# Patient Record
Sex: Female | Born: 1950 | Race: White | Hispanic: No | Marital: Single | Smoking: Never smoker
Health system: Southern US, Community
[De-identification: ages and names within clinical notes are randomized; demographics above are authoritative.]

## PROBLEM LIST (undated history)

## (undated) DIAGNOSIS — F32A Depression, unspecified: Secondary | ICD-10-CM

## (undated) DIAGNOSIS — E669 Obesity, unspecified: Secondary | ICD-10-CM

## (undated) DIAGNOSIS — D649 Anemia, unspecified: Secondary | ICD-10-CM

## (undated) DIAGNOSIS — M1990 Unspecified osteoarthritis, unspecified site: Secondary | ICD-10-CM

## (undated) DIAGNOSIS — I10 Essential (primary) hypertension: Secondary | ICD-10-CM

## (undated) DIAGNOSIS — E559 Vitamin D deficiency, unspecified: Secondary | ICD-10-CM

## (undated) DIAGNOSIS — G4730 Sleep apnea, unspecified: Secondary | ICD-10-CM

## (undated) DIAGNOSIS — N946 Dysmenorrhea, unspecified: Secondary | ICD-10-CM

## (undated) DIAGNOSIS — H269 Unspecified cataract: Secondary | ICD-10-CM

## (undated) DIAGNOSIS — E785 Hyperlipidemia, unspecified: Secondary | ICD-10-CM

## (undated) DIAGNOSIS — F329 Major depressive disorder, single episode, unspecified: Secondary | ICD-10-CM

## (undated) DIAGNOSIS — D219 Benign neoplasm of connective and other soft tissue, unspecified: Secondary | ICD-10-CM

## (undated) DIAGNOSIS — C801 Malignant (primary) neoplasm, unspecified: Secondary | ICD-10-CM

## (undated) DIAGNOSIS — R011 Cardiac murmur, unspecified: Secondary | ICD-10-CM

## (undated) DIAGNOSIS — M2550 Pain in unspecified joint: Secondary | ICD-10-CM

## (undated) DIAGNOSIS — R0602 Shortness of breath: Secondary | ICD-10-CM

## (undated) DIAGNOSIS — N6459 Other signs and symptoms in breast: Secondary | ICD-10-CM

## (undated) HISTORY — DX: Malignant (primary) neoplasm, unspecified: C80.1

## (undated) HISTORY — PX: BREAST EXCISIONAL BIOPSY: SUR124

## (undated) HISTORY — PX: BREAST BIOPSY: SHX20

## (undated) HISTORY — DX: Essential (primary) hypertension: I10

## (undated) HISTORY — DX: Sleep apnea, unspecified: G47.30

## (undated) HISTORY — DX: Anemia, unspecified: D64.9

## (undated) HISTORY — DX: Unspecified osteoarthritis, unspecified site: M19.90

## (undated) HISTORY — DX: Dysmenorrhea, unspecified: N94.6

## (undated) HISTORY — DX: Depression, unspecified: F32.A

## (undated) HISTORY — DX: Major depressive disorder, single episode, unspecified: F32.9

## (undated) HISTORY — PX: COLONOSCOPY: SHX174

## (undated) HISTORY — DX: Shortness of breath: R06.02

## (undated) HISTORY — DX: Unspecified cataract: H26.9

## (undated) HISTORY — DX: Cardiac murmur, unspecified: R01.1

## (undated) HISTORY — PX: EYE SURGERY: SHX253

## (undated) HISTORY — DX: Obesity, unspecified: E66.9

## (undated) HISTORY — PX: PELVIC LAPAROSCOPY: SHX162

## (undated) HISTORY — DX: Vitamin D deficiency, unspecified: E55.9

## (undated) HISTORY — DX: Hyperlipidemia, unspecified: E78.5

## (undated) HISTORY — DX: Pain in unspecified joint: M25.50

## (undated) HISTORY — DX: Benign neoplasm of connective and other soft tissue, unspecified: D21.9

---

## 2001-05-04 HISTORY — PX: TOTAL ABDOMINAL HYSTERECTOMY: SHX209

## 2008-02-21 HISTORY — PX: BLEPHAROPLASTY: SUR158

## 2016-06-26 ENCOUNTER — Other Ambulatory Visit: Payer: Self-pay | Admitting: Obstetrics and Gynecology

## 2016-06-26 DIAGNOSIS — Z1231 Encounter for screening mammogram for malignant neoplasm of breast: Secondary | ICD-10-CM

## 2016-07-04 ENCOUNTER — Ambulatory Visit
Admission: RE | Admit: 2016-07-04 | Discharge: 2016-07-04 | Disposition: A | Discharge status: Home / Self Care | Payer: PRIVATE HEALTH INSURANCE | Source: Ambulatory Visit | Attending: Obstetrics and Gynecology | Admitting: Obstetrics and Gynecology

## 2016-07-04 DIAGNOSIS — Z1231 Encounter for screening mammogram for malignant neoplasm of breast: Secondary | ICD-10-CM

## 2016-07-05 ENCOUNTER — Encounter: Payer: Self-pay | Admitting: Obstetrics and Gynecology

## 2016-07-05 ENCOUNTER — Ambulatory Visit (INDEPENDENT_AMBULATORY_CARE_PROVIDER_SITE_OTHER): Payer: 59 | Admitting: Obstetrics and Gynecology

## 2016-07-05 VITALS — BP 132/80 | HR 88 | Resp 14 | Ht 64.25 in | Wt 224.0 lb

## 2016-07-05 DIAGNOSIS — Z01419 Encounter for gynecological examination (general) (routine) without abnormal findings: Secondary | ICD-10-CM

## 2016-07-05 DIAGNOSIS — Z90710 Acquired absence of both cervix and uterus: Secondary | ICD-10-CM

## 2016-07-05 DIAGNOSIS — R3915 Urgency of urination: Secondary | ICD-10-CM | POA: Diagnosis not present

## 2016-07-05 LAB — POCT URINALYSIS DIPSTICK
BILIRUBIN UA: NEGATIVE
Blood, UA: NEGATIVE
GLUCOSE UA: NEGATIVE
KETONES UA: NEGATIVE
Nitrite, UA: POSITIVE
Protein, UA: NEGATIVE
Urobilinogen, UA: NEGATIVE
pH, UA: 5

## 2016-07-05 NOTE — Progress Notes (Signed)
66 y.o. G0P0000 SingleCaucasianF here as a new patient for an annual exam.  Patient has also had occasional urgency of urination and some pain when urinating. Her urinary symptoms are mild and intermittent for the last month. Sometimes she has urgency, with small amounts, not always. Pain is just off and on.  H/O TAH/BSO. Hasn't been sexually active for years. No vaginal bleeding.  She had her hysterectomy at 4, she was on premarin until last year. She is having several hot flashes a day. Not getting any better or worse. She has night sweats about 1 x a night.     Patient's last menstrual period was 06/12/2000.          Sexually active: No.  The current method of family planning is status post hysterectomy.    Exercising: No.  The patient does not participate in regular exercise at present. Smoker:  no  Health Maintenance: Pap:  07/02/15 [facility] - per patient normal History of abnormal Pap:  no MMG:  07/04/16 - no results in EPIC yet Colonoscopy:  09/02/12 - Polyps removed - normal per patient BMD:   06/04/13 - normal per patient  TDaP:  03/20/2008 Gardasil: N/A   reports that she has never smoked. She has never used smokeless tobacco. She reports that she does not drink alcohol or use drugs.Retired, she was a Network engineer.   Past Medical History:  Diagnosis Date  . Depression   . Dysmenorrhea   . Fibroid   . Hyperlipemia   . Hyperlipidemia   . Hypertension     Past Surgical History:  Procedure Laterality Date  . BLEPHAROPLASTY Bilateral 02/21/2008  . BREAST BIOPSY Right   . PELVIC LAPAROSCOPY    . TOTAL ABDOMINAL HYSTERECTOMY  05/04/2001    Current Outpatient Prescriptions  Medication Sig Dispense Refill  . aspirin EC 81 MG tablet Take 81 mg by mouth daily.    . Calcium Carbonate-Vit D-Min (CALCIUM 1200 PO) Take by mouth daily.    . cholecalciferol (VITAMIN D) 1000 units tablet Take 1,000 Units by mouth daily.    Marland Kitchen losartan (COZAAR) 100 MG tablet Take  1 tablet by mouth daily.  1  . simvastatin (ZOCOR) 20 MG tablet Take 1 tablet by mouth daily.  6   No current facility-administered medications for this visit.     Family History  Problem Relation Age of Onset  . Hypertension Mother   . Alzheimer's disease Mother   . Heart disease Mother   . Hyperlipidemia Mother   . Hypertension Father   . Diabetes Father   . Stroke Father   . Breast cancer Sister   . Hypertension Sister   . Breast cancer Sister   . Hypertension Sister   . Hypertension Sister     Review of Systems  Constitutional: Negative.   Eyes: Negative.   Respiratory: Negative.   Cardiovascular: Negative.   Gastrointestinal: Negative.   Endocrine: Negative.   Genitourinary: Negative.   Musculoskeletal: Negative.   Skin: Negative.   Allergic/Immunologic: Negative.   Neurological: Negative.   Hematological: Negative.   Psychiatric/Behavioral: Negative.     Exam:   BP 132/80 (BP Location: Right Arm, Patient Position: Sitting, Cuff Size: Large)   Pulse 88   Resp 14   Ht 5' 4.25" (1.632 m)   Wt 224 lb (101.6 kg)   LMP 06/12/2000   BMI 38.15 kg/m   Weight change: @WEIGHTCHANGE @ Height:   Height: 5' 4.25" (163.2 cm)  Ht Readings from Last  3 Encounters:  07/05/16 5' 4.25" (1.632 m)    General appearance: alert, cooperative and appears stated age Head: Normocephalic, without obvious abnormality, atraumatic Neck: no adenopathy, supple, symmetrical, trachea midline and thyroid normal to inspection and palpation Lungs: clear to auscultation bilaterally Cardiovascular: regular rate and rhythm Breasts: normal appearance, no masses or tenderness, bilaterally inverted nipples (always) Heart: regular rate and rhythm Abdomen: soft, non-tender; bowel sounds normal; no masses,  no organomegaly Extremities: extremities normal, atraumatic, no cyanosis or edema Skin: Skin color, texture, turgor normal. No rashes or lesions Lymph nodes: Cervical, supraclavicular, and  axillary nodes normal. No abnormal inguinal nodes palpated Neurologic: Grossly normal   Pelvic: External genitalia:  no lesions              Urethra:  normal appearing urethra with no masses, tenderness or lesions              Bartholins and Skenes: normal                 Vagina: normal appearing vagina with normal color and discharge, no lesions              Cervix: absent               Bimanual Exam:  Uterus:  uterus absent              Adnexa: no mass, fullness, tenderness               Rectovaginal: Confirms               Anus:  normal sphincter tone, no lesions  Chaperone was present for exam.  Urine dip with Positive nitrates, trace leuk  A:  Well Woman with normal exam  H/O hysterectomy  Vague urinary c/o, urine dip concerning for UTI  Vasomotor symptoms. Off of ERT for the last year, off antidepresssants  P:   No pap needed  Urine for ua, c&s (will await culture to treat since her symptoms are long term and vague)  Mammogram just done  Colonoscopy UTD  Labs and immunizations with primary MD  Discussed behavioral changes for vasomotor

## 2016-07-05 NOTE — Patient Instructions (Signed)

## 2016-07-06 LAB — URINALYSIS, MICROSCOPIC ONLY
Casts: NONE SEEN [LPF]
Crystals: NONE SEEN [HPF]
Yeast: NONE SEEN [HPF]

## 2016-07-07 ENCOUNTER — Telehealth: Payer: Self-pay

## 2016-07-07 LAB — URINE CULTURE

## 2016-07-07 MED ORDER — SULFAMETHOXAZOLE-TRIMETHOPRIM 800-160 MG PO TABS: 1.0000 | ORAL_TABLET | Freq: Two times a day (BID) | ORAL | 0 refills | Status: DC

## 2016-07-07 NOTE — Telephone Encounter (Signed)
-----   Message from Salvadore Dom, MD sent at 07/06/2016  8:41 AM EST ----- Suspicious for UTI, culture pending. Long term vague urinary c/o. Will await culture to treat.  Will you please look for culture results tomorrow, I'll be out of town. You can call me, or run it by one of the other [place]. Thanks!

## 2016-07-07 NOTE — Telephone Encounter (Signed)
Spoke with patient. Advised of message as seen below from [place]. Patient verbalizes understanding. Patient states she is having intermittent urinary urgency and pain with urination. Denies worsening symptoms. Rx for Bactrim DS take 1 tablet po BID x 3 days #6 0RF sent to pharmacy on file.  Notes Recorded by Nunzio Cobbs, MD on 07/07/2016 at 5:40 AM EST Please touch base with the patient to see how her urinary symptoms are today.  Her urine culture is showing infection with E Coli.  I would like to have her get started on Bactrim DS one po bid for 3 days.  Please send to pharmacy of choice. Final sensitivities are pending, so it is possible that we will need to switch her antibiotic when this results.  It is the weekend, and I want her to be able to start treatment.  Drink plenty of water also!  Cc: Dr.Jertson  Routing to provider for final review. Patient agreeable to disposition. Will close encounter.

## 2016-07-07 NOTE — Telephone Encounter (Signed)
Encounter opened in error

## 2016-09-18 DIAGNOSIS — Z6838 Body mass index (BMI) 38.0-38.9, adult: Secondary | ICD-10-CM | POA: Diagnosis not present

## 2016-09-18 DIAGNOSIS — I10 Essential (primary) hypertension: Secondary | ICD-10-CM | POA: Diagnosis not present

## 2016-09-18 DIAGNOSIS — Z1159 Encounter for screening for other viral diseases: Secondary | ICD-10-CM | POA: Diagnosis not present

## 2016-09-18 DIAGNOSIS — R635 Abnormal weight gain: Secondary | ICD-10-CM | POA: Diagnosis not present

## 2016-09-18 DIAGNOSIS — E669 Obesity, unspecified: Secondary | ICD-10-CM | POA: Diagnosis not present

## 2016-09-18 DIAGNOSIS — E785 Hyperlipidemia, unspecified: Secondary | ICD-10-CM | POA: Diagnosis not present

## 2016-09-18 DIAGNOSIS — N183 Chronic kidney disease, stage 3 (moderate): Secondary | ICD-10-CM | POA: Diagnosis not present

## 2016-10-12 DIAGNOSIS — L237 Allergic contact dermatitis due to plants, except food: Secondary | ICD-10-CM | POA: Diagnosis not present

## 2016-10-12 DIAGNOSIS — D485 Neoplasm of uncertain behavior of skin: Secondary | ICD-10-CM | POA: Diagnosis not present

## 2016-10-12 DIAGNOSIS — L570 Actinic keratosis: Secondary | ICD-10-CM | POA: Diagnosis not present

## 2016-10-12 DIAGNOSIS — L821 Other seborrheic keratosis: Secondary | ICD-10-CM | POA: Diagnosis not present

## 2016-10-12 DIAGNOSIS — L814 Other melanin hyperpigmentation: Secondary | ICD-10-CM | POA: Diagnosis not present

## 2016-10-12 DIAGNOSIS — D1801 Hemangioma of skin and subcutaneous tissue: Secondary | ICD-10-CM | POA: Diagnosis not present

## 2016-11-07 DIAGNOSIS — Z23 Encounter for immunization: Secondary | ICD-10-CM | POA: Diagnosis not present

## 2017-02-09 DIAGNOSIS — R8290 Unspecified abnormal findings in urine: Secondary | ICD-10-CM | POA: Diagnosis not present

## 2017-02-09 DIAGNOSIS — R509 Fever, unspecified: Secondary | ICD-10-CM | POA: Diagnosis not present

## 2017-02-10 DIAGNOSIS — R8290 Unspecified abnormal findings in urine: Secondary | ICD-10-CM | POA: Diagnosis not present

## 2017-04-27 DIAGNOSIS — H6501 Acute serous otitis media, right ear: Secondary | ICD-10-CM | POA: Diagnosis not present

## 2017-04-27 DIAGNOSIS — R05 Cough: Secondary | ICD-10-CM | POA: Diagnosis not present

## 2017-06-06 ENCOUNTER — Other Ambulatory Visit: Payer: Self-pay | Admitting: Obstetrics and Gynecology

## 2017-06-06 DIAGNOSIS — Z1231 Encounter for screening mammogram for malignant neoplasm of breast: Secondary | ICD-10-CM

## 2017-06-22 DIAGNOSIS — R69 Illness, unspecified: Secondary | ICD-10-CM | POA: Diagnosis not present

## 2017-07-02 ENCOUNTER — Ambulatory Visit: Payer: 59 | Admitting: Obstetrics and Gynecology

## 2017-07-05 ENCOUNTER — Ambulatory Visit
Admission: RE | Admit: 2017-07-05 | Discharge: 2017-07-05 | Disposition: A | Discharge status: Home / Self Care | Payer: Medicare HMO | Source: Ambulatory Visit | Attending: Obstetrics and Gynecology | Admitting: Obstetrics and Gynecology

## 2017-07-05 DIAGNOSIS — Z1231 Encounter for screening mammogram for malignant neoplasm of breast: Secondary | ICD-10-CM | POA: Diagnosis not present

## 2017-07-09 ENCOUNTER — Ambulatory Visit: Payer: 59 | Admitting: Obstetrics and Gynecology

## 2017-07-16 ENCOUNTER — Telehealth: Payer: Self-pay | Admitting: Obstetrics and Gynecology

## 2017-07-16 NOTE — Telephone Encounter (Signed)
Left message regarding upcoming appointment has been canceled and needs to be rescheduled. °

## 2017-08-03 ENCOUNTER — Ambulatory Visit: Payer: 59 | Admitting: Obstetrics and Gynecology

## 2017-08-03 ENCOUNTER — Encounter: Payer: Self-pay | Admitting: Obstetrics and Gynecology

## 2017-08-03 ENCOUNTER — Other Ambulatory Visit: Payer: Self-pay

## 2017-08-03 ENCOUNTER — Ambulatory Visit (INDEPENDENT_AMBULATORY_CARE_PROVIDER_SITE_OTHER): Payer: Medicare HMO | Admitting: Obstetrics and Gynecology

## 2017-08-03 VITALS — BP 158/80 | HR 88 | Resp 16 | Ht 64.0 in | Wt 232.0 lb

## 2017-08-03 DIAGNOSIS — Z01419 Encounter for gynecological examination (general) (routine) without abnormal findings: Secondary | ICD-10-CM | POA: Diagnosis not present

## 2017-08-03 DIAGNOSIS — R011 Cardiac murmur, unspecified: Secondary | ICD-10-CM | POA: Diagnosis not present

## 2017-08-03 NOTE — Patient Instructions (Signed)

## 2017-08-03 NOTE — Progress Notes (Signed)
67 y.o. G0P0000 SingleCaucasianF here for annual exam.  H/O TAH/BSO. No vaginal bleeding, not currently sexually active.  She had pyelonephritis in 9/18.  No bladder c/o.  Plans to visit her niece in [place].     Patient's last menstrual period was 06/12/2000.          Sexually active: No.  The current method of family planning is status post hysterectomy.    Exercising: Yes.    silver sneakers  Smoker:  no  Health Maintenance: Pap:  07/02/15 [facility] - per patient normalHistory of abnormal Pap:  no MMG:  07-05-17 WNL  Colonoscopy:  3-24-14normal per patient  BMD:   06-04-13 normal per patient  TDaP:  03-20-08 Gardasil: N/A   reports that  has never smoked. she has never used smokeless tobacco. She reports that she drinks about 1.8 - 3.0 oz of alcohol per week. She reports that she does not use drugs. 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  Past Medical History:  Diagnosis Date  . Depression   . Dysmenorrhea   . Fibroid   . Hyperlipemia   . Hyperlipidemia   . Hypertension     Past Surgical History:  Procedure Laterality Date  . BLEPHAROPLASTY Bilateral 02/21/2008  . BREAST BIOPSY Right   . BREAST EXCISIONAL BIOPSY Right    benign  . PELVIC LAPAROSCOPY    . TOTAL ABDOMINAL HYSTERECTOMY  05/04/2001    Current Outpatient Medications  Medication Sig Dispense Refill  . aspirin EC 81 MG tablet Take 81 mg by mouth daily.    . Calcium Carbonate-Vit D-Min (CALCIUM 1200 PO) Take by mouth daily.    . cholecalciferol (VITAMIN D) 1000 units tablet Take 1,000 Units by mouth daily.    Marland Kitchen losartan (COZAAR) 100 MG tablet Take 1 tablet by mouth daily.  1  . simvastatin (ZOCOR) 20 MG tablet Take 1 tablet by mouth daily.  6   No current facility-administered medications for this visit.     Family History  Problem Relation Age of Onset  . Hypertension Mother   . Alzheimer's disease Mother   . Heart disease Mother   .  Hyperlipidemia Mother   . Hypertension Father   . Diabetes Father   . Stroke Father   . Breast cancer Sister   . Hypertension Sister   . Breast cancer Sister   . Hypertension Sister   . Hypertension Sister     Review of Systems  Constitutional: Negative.   HENT: Negative.   Eyes: Negative.   Respiratory: Negative.   Cardiovascular: Negative.   Gastrointestinal: Negative.   Endocrine: Negative.   Genitourinary: Negative.   Musculoskeletal: Negative.   Skin: Negative.   Allergic/Immunologic: Negative.   Neurological: Negative.   Psychiatric/Behavioral: Negative.     Exam:   BP (!) 158/80 (BP Location: Right Arm, Patient Position: Sitting, Cuff Size: Normal)   Pulse 88   Resp 16   Ht 5\' 4"  (1.626 m)   Wt 232 lb (105.2 kg)   LMP 06/12/2000   BMI 39.82 kg/m   Weight change: @WEIGHTCHANGE @ Height:   Height: 5\' 4"  (162.6 cm)  Ht Readings from Last 3 Encounters:  08/03/17 5\' 4"  (1.626 m)  07/05/16 5' 4.25" (1.632 m)    General appearance: alert, cooperative and appears stated age Head: Normocephalic, without obvious abnormality, atraumatic Neck: no adenopathy, supple, symmetrical, trachea midline and thyroid normal to inspection and palpation Lungs: clear to  auscultation bilaterally Cardiovascular: regular rate and rhythm, grade 3/6 SEM at the RSB Breasts: normal appearance, no masses or tenderness, bilateral nipple inversion Abdomen: soft, non-tender; non distended,  no masses,  no organomegaly Extremities: extremities normal, atraumatic, no cyanosis or edema Skin: Skin color, texture, turgor normal. No rashes or lesions Lymph nodes: Cervical, supraclavicular, and axillary nodes normal. No abnormal inguinal nodes palpated Neurologic: Grossly normal   Pelvic: External genitalia:  no lesions              Urethra:  normal appearing urethra with no masses, tenderness or lesions              Bartholins and Skenes: normal                 Vagina: normal appearing vagina  with normal color and discharge, no lesions              Cervix: absent               Bimanual Exam:  Uterus:  not examined              Adnexa: no mass, fullness, tenderness               Rectovaginal: Confirms               Anus:  normal sphincter tone, no lesions  Chaperone was present for exam.  A:  Well Woman with normal exam  New heart murmur, will f/u with primary  P:   No pap   Mammogram, colonoscopy and dexa UTD  Discussed breast self exam  Discussed calcium and vit D intake  Labs and immunization with primary     CC: Dr Kathyrn Lass

## 2017-09-05 DIAGNOSIS — H43812 Vitreous degeneration, left eye: Secondary | ICD-10-CM | POA: Diagnosis not present

## 2017-09-05 DIAGNOSIS — H2513 Age-related nuclear cataract, bilateral: Secondary | ICD-10-CM | POA: Diagnosis not present

## 2017-09-05 DIAGNOSIS — H25812 Combined forms of age-related cataract, left eye: Secondary | ICD-10-CM | POA: Diagnosis not present

## 2017-09-05 DIAGNOSIS — H524 Presbyopia: Secondary | ICD-10-CM | POA: Diagnosis not present

## 2017-09-05 DIAGNOSIS — H52222 Regular astigmatism, left eye: Secondary | ICD-10-CM | POA: Diagnosis not present

## 2017-09-05 DIAGNOSIS — H52221 Regular astigmatism, right eye: Secondary | ICD-10-CM | POA: Diagnosis not present

## 2017-09-05 DIAGNOSIS — H5201 Hypermetropia, right eye: Secondary | ICD-10-CM | POA: Diagnosis not present

## 2017-10-09 DIAGNOSIS — L304 Erythema intertrigo: Secondary | ICD-10-CM | POA: Diagnosis not present

## 2017-10-09 DIAGNOSIS — D1801 Hemangioma of skin and subcutaneous tissue: Secondary | ICD-10-CM | POA: Diagnosis not present

## 2017-10-09 DIAGNOSIS — L570 Actinic keratosis: Secondary | ICD-10-CM | POA: Diagnosis not present

## 2017-10-09 DIAGNOSIS — D225 Melanocytic nevi of trunk: Secondary | ICD-10-CM | POA: Diagnosis not present

## 2017-10-09 DIAGNOSIS — L718 Other rosacea: Secondary | ICD-10-CM | POA: Diagnosis not present

## 2017-10-09 DIAGNOSIS — L821 Other seborrheic keratosis: Secondary | ICD-10-CM | POA: Diagnosis not present

## 2017-10-09 DIAGNOSIS — L814 Other melanin hyperpigmentation: Secondary | ICD-10-CM | POA: Diagnosis not present

## 2017-10-09 DIAGNOSIS — C44319 Basal cell carcinoma of skin of other parts of face: Secondary | ICD-10-CM | POA: Diagnosis not present

## 2017-10-09 DIAGNOSIS — D2272 Melanocytic nevi of left lower limb, including hip: Secondary | ICD-10-CM | POA: Diagnosis not present

## 2017-10-09 DIAGNOSIS — L720 Epidermal cyst: Secondary | ICD-10-CM | POA: Diagnosis not present

## 2017-10-17 DIAGNOSIS — I10 Essential (primary) hypertension: Secondary | ICD-10-CM | POA: Diagnosis not present

## 2017-10-17 DIAGNOSIS — R011 Cardiac murmur, unspecified: Secondary | ICD-10-CM | POA: Diagnosis not present

## 2017-10-17 DIAGNOSIS — Z1211 Encounter for screening for malignant neoplasm of colon: Secondary | ICD-10-CM | POA: Diagnosis not present

## 2017-10-17 DIAGNOSIS — Z0000 Encounter for general adult medical examination without abnormal findings: Secondary | ICD-10-CM | POA: Diagnosis not present

## 2017-10-17 DIAGNOSIS — C4330 Malignant melanoma of unspecified part of face: Secondary | ICD-10-CM | POA: Diagnosis not present

## 2017-10-17 DIAGNOSIS — E785 Hyperlipidemia, unspecified: Secondary | ICD-10-CM | POA: Diagnosis not present

## 2017-10-17 DIAGNOSIS — Z1389 Encounter for screening for other disorder: Secondary | ICD-10-CM | POA: Diagnosis not present

## 2017-10-17 DIAGNOSIS — N183 Chronic kidney disease, stage 3 (moderate): Secondary | ICD-10-CM | POA: Diagnosis not present

## 2017-10-18 ENCOUNTER — Other Ambulatory Visit (HOSPITAL_COMMUNITY): Payer: Self-pay | Admitting: Family Medicine

## 2017-10-18 DIAGNOSIS — R011 Cardiac murmur, unspecified: Secondary | ICD-10-CM

## 2017-10-19 ENCOUNTER — Telehealth: Payer: Self-pay | Admitting: Obstetrics and Gynecology

## 2017-10-19 DIAGNOSIS — E2839 Other primary ovarian failure: Secondary | ICD-10-CM

## 2017-10-19 NOTE — Telephone Encounter (Signed)
Spoke with patient. Last BMD was performed in [place] 5 years ago. PCP is requesting our office order patient's BMD. Order placed to the [facility]. Patient is aware she will be contacted to schedule.  Routing to provider for final review. Patient agreeable to disposition. Will close encounter.

## 2017-10-19 NOTE — Telephone Encounter (Signed)
Patient left a voicemail stating that her general practitioner wanted Dr. Talbert Nan to schedule a bone density test and that it had been 5 years since her last.

## 2017-10-25 ENCOUNTER — Ambulatory Visit (HOSPITAL_COMMUNITY)
Admission: RE | Admit: 2017-10-25 | Discharge: 2017-10-25 | Disposition: A | Discharge status: Home / Self Care | Payer: Medicare HMO | Source: Ambulatory Visit | Attending: Family Medicine | Admitting: Family Medicine

## 2017-10-25 DIAGNOSIS — R011 Cardiac murmur, unspecified: Secondary | ICD-10-CM | POA: Diagnosis not present

## 2017-10-25 DIAGNOSIS — I119 Hypertensive heart disease without heart failure: Secondary | ICD-10-CM | POA: Diagnosis not present

## 2017-10-25 DIAGNOSIS — I083 Combined rheumatic disorders of mitral, aortic and tricuspid valves: Secondary | ICD-10-CM | POA: Diagnosis not present

## 2017-10-25 NOTE — Progress Notes (Signed)
  Echocardiogram 2D Echocardiogram has been performed.  Chaneka Trefz T Becki Mccaskill 10/25/2017, 3:15 PM

## 2017-11-19 DIAGNOSIS — Z23 Encounter for immunization: Secondary | ICD-10-CM | POA: Diagnosis not present

## 2017-11-19 DIAGNOSIS — Z6839 Body mass index (BMI) 39.0-39.9, adult: Secondary | ICD-10-CM | POA: Diagnosis not present

## 2017-11-19 DIAGNOSIS — I129 Hypertensive chronic kidney disease with stage 1 through stage 4 chronic kidney disease, or unspecified chronic kidney disease: Secondary | ICD-10-CM | POA: Diagnosis not present

## 2017-11-19 DIAGNOSIS — E785 Hyperlipidemia, unspecified: Secondary | ICD-10-CM | POA: Diagnosis not present

## 2017-11-19 DIAGNOSIS — N183 Chronic kidney disease, stage 3 (moderate): Secondary | ICD-10-CM | POA: Diagnosis not present

## 2017-11-23 DIAGNOSIS — Z86010 Personal history of colonic polyps: Secondary | ICD-10-CM | POA: Diagnosis not present

## 2017-11-23 DIAGNOSIS — K5730 Diverticulosis of large intestine without perforation or abscess without bleeding: Secondary | ICD-10-CM | POA: Diagnosis not present

## 2017-11-23 DIAGNOSIS — D126 Benign neoplasm of colon, unspecified: Secondary | ICD-10-CM | POA: Diagnosis not present

## 2017-11-26 ENCOUNTER — Ambulatory Visit
Admission: RE | Admit: 2017-11-26 | Discharge: 2017-11-26 | Disposition: A | Discharge status: Home / Self Care | Payer: Medicare HMO | Source: Ambulatory Visit | Attending: Obstetrics and Gynecology | Admitting: Obstetrics and Gynecology

## 2017-11-26 DIAGNOSIS — M8589 Other specified disorders of bone density and structure, multiple sites: Secondary | ICD-10-CM | POA: Diagnosis not present

## 2017-11-26 DIAGNOSIS — E2839 Other primary ovarian failure: Secondary | ICD-10-CM

## 2017-11-26 DIAGNOSIS — Z780 Asymptomatic menopausal state: Secondary | ICD-10-CM | POA: Diagnosis not present

## 2017-11-27 DIAGNOSIS — D126 Benign neoplasm of colon, unspecified: Secondary | ICD-10-CM | POA: Diagnosis not present

## 2018-02-12 DIAGNOSIS — J069 Acute upper respiratory infection, unspecified: Secondary | ICD-10-CM | POA: Diagnosis not present

## 2018-04-11 DIAGNOSIS — L820 Inflamed seborrheic keratosis: Secondary | ICD-10-CM | POA: Diagnosis not present

## 2018-04-11 DIAGNOSIS — I788 Other diseases of capillaries: Secondary | ICD-10-CM | POA: Diagnosis not present

## 2018-04-11 DIAGNOSIS — Z85828 Personal history of other malignant neoplasm of skin: Secondary | ICD-10-CM | POA: Diagnosis not present

## 2018-04-25 DIAGNOSIS — R69 Illness, unspecified: Secondary | ICD-10-CM | POA: Diagnosis not present

## 2018-05-31 ENCOUNTER — Other Ambulatory Visit: Payer: Self-pay | Admitting: Obstetrics and Gynecology

## 2018-05-31 DIAGNOSIS — Z1231 Encounter for screening mammogram for malignant neoplasm of breast: Secondary | ICD-10-CM

## 2018-06-28 DIAGNOSIS — R69 Illness, unspecified: Secondary | ICD-10-CM | POA: Diagnosis not present

## 2018-07-22 ENCOUNTER — Ambulatory Visit: Payer: Medicare HMO

## 2018-07-22 ENCOUNTER — Ambulatory Visit
Admission: RE | Admit: 2018-07-22 | Discharge: 2018-07-22 | Disposition: A | Discharge status: Home / Self Care | Payer: Medicare HMO | Source: Ambulatory Visit | Attending: Obstetrics and Gynecology | Admitting: Obstetrics and Gynecology

## 2018-07-22 DIAGNOSIS — Z1231 Encounter for screening mammogram for malignant neoplasm of breast: Secondary | ICD-10-CM

## 2018-08-22 ENCOUNTER — Encounter: Payer: Self-pay | Admitting: Obstetrics and Gynecology

## 2018-08-22 ENCOUNTER — Ambulatory Visit (INDEPENDENT_AMBULATORY_CARE_PROVIDER_SITE_OTHER): Payer: Medicare HMO | Admitting: Obstetrics and Gynecology

## 2018-08-22 ENCOUNTER — Other Ambulatory Visit: Payer: Self-pay

## 2018-08-22 VITALS — BP 136/60 | HR 96 | Ht 64.0 in | Wt 230.0 lb

## 2018-08-22 DIAGNOSIS — M8588 Other specified disorders of bone density and structure, other site: Secondary | ICD-10-CM | POA: Diagnosis not present

## 2018-08-22 DIAGNOSIS — Z01419 Encounter for gynecological examination (general) (routine) without abnormal findings: Secondary | ICD-10-CM

## 2018-08-22 NOTE — Progress Notes (Addendum)
68 y.o. G0P0000 Single White or Caucasian Not Hispanic or Latino female here for annual exam.  H/O TAH/BSO. No vaginal bleeding. Not sexually active.     She traveled to [place] in January with her [place]oro [place], wonderful experience.   Patient's last menstrual period was 06/12/2000.           Sexually active: No.  The current method of family planning is post menopausal status.    Exercising: No.  The patient does not participate in regular exercise at present. Smoker:  no  Health Maintenance: Pap:  07/02/15 [facility] - per patient normal History of abnormal Pap:  no MMG:  07/22/2018 [facility] 1 negative Colonoscopy:  11/23/2017 polyps, f/u 2024  BMD:  11/26/2017 mild osteopenia, T score -1.2, FRAX 14.3/0.8% TDaP:  06/25/2018 Gardasil: N/A   reports that she has never smoked. She has never used smokeless tobacco. She reports current alcohol use of about 1.0 - 5.0 standard drinks of alcohol per week. She reports that she does not use drugs. 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 Depression   . Dysmenorrhea   . Fibroid   . Hyperlipemia   . Hyperlipidemia   . Hypertension     Past Surgical History:  Procedure Laterality Date  . BLEPHAROPLASTY Bilateral 02/21/2008  . BREAST BIOPSY Right   . BREAST EXCISIONAL BIOPSY Right    benign  . PELVIC LAPAROSCOPY    . TOTAL ABDOMINAL HYSTERECTOMY  05/04/2001    Current Outpatient Medications  Medication Sig Dispense Refill  . aspirin EC 81 MG tablet Take 81 mg by mouth daily.    . Calcium Carbonate-Vit D-Min (CALCIUM 1200 PO) Take by mouth daily.    . cholecalciferol (VITAMIN D) 1000 units tablet Take 1,000 Units by mouth daily.    . hydrochlorothiazide (HYDRODIURIL) 25 MG tablet Take 1 tablet by mouth daily.    Marland Kitchen losartan (COZAAR) 100 MG tablet Take 1 tablet by mouth daily.  1  . simvastatin (ZOCOR) 20 MG tablet Take 1 tablet by mouth  daily.  6   No current facility-administered medications for this visit.     Family History  Problem Relation Age of Onset  . Hypertension Mother   . Alzheimer's disease Mother   . Heart disease Mother   . Hyperlipidemia Mother   . Hypertension Father   . Diabetes Father   . Stroke Father   . Breast cancer Sister   . Hypertension Sister   . Breast cancer Sister   . Hypertension Sister   . Hypertension Sister   She had 4 sisters, one drowned in her early 31's. 2 sisters with breast cancer, one was premenopausal, one was postmenopausal.   Review of Systems  Constitutional: Negative.   HENT: Positive for ear discharge.   Eyes: Negative.   Respiratory: Negative.   Cardiovascular: Negative.   Gastrointestinal: Negative.   Endocrine: Negative.   Genitourinary: Positive for vaginal discharge.       Nocturia  Musculoskeletal: Negative.   Skin: Negative.   Allergic/Immunologic: Negative.   Neurological: Negative.   Hematological: Negative.   Psychiatric/Behavioral: Negative.     Exam:   BP 136/60 (BP Location: Right Arm, Patient Position: Sitting, Cuff Size: Large)   Pulse 96   Ht 5\' 4"  (1.626 m)   Wt 230 lb (104.3 kg)   LMP 06/12/2000   BMI 39.48 kg/m   Weight change: @WEIGHTCHANGE @  Height:   Height: 5\' 4"  (162.6 cm)  Ht Readings from Last 3 Encounters:  08/22/18 5\' 4"  (1.626 m)  08/03/17 5\' 4"  (1.626 m)  07/05/16 5' 4.25" (1.632 m)    General appearance: alert, cooperative and appears stated age Head: Normocephalic, without obvious abnormality, atraumatic Neck: no adenopathy, supple, symmetrical, trachea midline and thyroid normal to inspection and palpation Lungs: clear to auscultation bilaterally Cardiovascular: regular rate and rhythm Breasts: normal appearance, no masses or tenderness, bilaterally inverted nipples, no change. Abdomen: soft, non-tender; non distended,  no masses,  no organomegaly Extremities: extremities normal, atraumatic, no cyanosis or  edema Skin: Skin color, texture, turgor normal. No rashes or lesions Lymph nodes: Cervical, supraclavicular, and axillary nodes normal. No abnormal inguinal nodes palpated Neurologic: Grossly normal   Pelvic: External genitalia:  no lesions              Urethra:  normal appearing urethra with no masses, tenderness or lesions              Bartholins and Skenes: normal                 Vagina: normal appearing vagina with normal color and discharge, no lesions              Cervix: absent               Bimanual Exam:  Uterus:  uterus absent              Adnexa: no mass, fullness, tenderness               Rectovaginal: Confirms               Anus:  normal sphincter tone, no lesions  Chaperone was present for exam.  A:  Well Woman with normal exam  Family history of breast cancer. One sister had genetic testing and it was negative (PMP with diagnosis).   P:   No pap needed  Discussed breast self exam  Discussed calcium and vit D intake  Mammogram UTD  Colonoscopy UTD  DEXA UTD  She declines seeing a genetic counselor (she will think about it).   Labs with primary  Addendum: systolic heart murmur RSB, has had an echocardiogram

## 2018-08-22 NOTE — Patient Instructions (Signed)
EXERCISE AND DIET:  We recommended that you start or continue a regular exercise program for good health. Regular exercise means any activity that makes your heart beat faster and makes you sweat.  We recommend exercising at least 30 minutes per day at least 3 days a week, preferably 4 or 5.  We also recommend a diet low in fat and sugar.  Inactivity, poor dietary choices and obesity can cause diabetes, heart attack, stroke, and kidney damage, among others.    ALCOHOL AND SMOKING:  Women should limit their alcohol intake to no more than 7 drinks/beers/glasses of wine (combined, not each!) per week. Moderation of alcohol intake to this level decreases your risk of breast cancer and liver damage. And of course, no recreational drugs are part of a healthy lifestyle.  And absolutely no smoking or even second hand smoke. Most people know smoking can cause heart and lung diseases, but did you know it also contributes to weakening of your bones? Aging of your skin?  Yellowing of your teeth and nails?  CALCIUM AND VITAMIN D:  Adequate intake of calcium and Vitamin D are recommended.  The recommendations for exact amounts of these supplements seem to change often, but generally speaking 1,200 mg of calcium (between diet and supplement) and 800 units of Vitamin D per day seems prudent. Certain women may benefit from higher intake of Vitamin D.  If you are among these women, your doctor will have told you during your visit.    PAP SMEARS:  Pap smears, to check for cervical cancer or precancers,  have traditionally been done yearly, although recent scientific advances have shown that most women can have pap smears less often.  However, every woman still should have a physical exam from her gynecologist every year. It will include a breast check, inspection of the vulva and vagina to check for abnormal growths or skin changes, a visual exam of the cervix, and then an exam to evaluate the size and shape of the uterus and  ovaries.  And after 67 years of age, a rectal exam is indicated to check for rectal cancers. We will also provide age appropriate advice regarding health maintenance, like when you should have certain vaccines, screening for sexually transmitted diseases, bone density testing, colonoscopy, mammograms, etc.   MAMMOGRAMS:  All women over 40 years old should have a yearly mammogram. Many facilities now offer a "3D" mammogram, which may cost around $50 extra out of pocket. If possible,  we recommend you accept the option to have the 3D mammogram performed.  It both reduces the number of women who will be called back for extra views which then turn out to be normal, and it is better than the routine mammogram at detecting truly abnormal areas.    COLON CANCER SCREENING: Now recommend starting at age 45. At this time colonoscopy is not covered for routine screening until 50. There are take home tests that can be done between 45-49.   COLONOSCOPY:  Colonoscopy to screen for colon cancer is recommended for all women at age 50.  We know, you hate the idea of the prep.  We agree, BUT, having colon cancer and not knowing it is worse!!  Colon cancer so often starts as a polyp that can be seen and removed at colonscopy, which can quite literally save your life!  And if your first colonoscopy is normal and you have no family history of colon cancer, most women don't have to have it again for   10 years.  Once every ten years, you can do something that may end up saving your life, right?  We will be happy to help you get it scheduled when you are ready.  Be sure to check your insurance coverage so you understand how much it will cost.  It may be covered as a preventative service at no cost, but you should check your particular policy.      Breast Self-Awareness Breast self-awareness means being familiar with how your breasts look and feel. It involves checking your breasts regularly and reporting any changes to your  health care provider. Practicing breast self-awareness is important. A change in your breasts can be a sign of a serious medical problem. Being familiar with how your breasts look and feel allows you to find any problems early, when treatment is more likely to be successful. All women should practice breast self-awareness, including women who have had breast implants. How to do a breast self-exam One way to learn what is normal for your breasts and whether your breasts are changing is to do a breast self-exam. To do a breast self-exam: Look for Changes  1. Remove all the clothing above your waist. 2. Stand in front of a mirror in a room with good lighting. 3. Put your hands on your hips. 4. Push your hands firmly downward. 5. Compare your breasts in the mirror. Look for differences between them (asymmetry), such as: ? Differences in shape. ? Differences in size. ? Puckers, dips, and bumps in one breast and not the other. 6. Look at each breast for changes in your skin, such as: ? Redness. ? Scaly areas. 7. Look for changes in your nipples, such as: ? Discharge. ? Bleeding. ? Dimpling. ? Redness. ? A change in position. Feel for Changes Carefully feel your breasts for lumps and changes. It is best to do this while lying on your back on the floor and again while sitting or standing in the shower or tub with soapy water on your skin. Feel each breast in the following way:  Place the arm on the side of the breast you are examining above your head.  Feel your breast with the other hand.  Start in the nipple area and make  inch (2 cm) overlapping circles to feel your breast. Use the pads of your three middle fingers to do this. Apply light pressure, then medium pressure, then firm pressure. The light pressure will allow you to feel the tissue closest to the skin. The medium pressure will allow you to feel the tissue that is a little deeper. The firm pressure will allow you to feel the tissue  close to the ribs.  Continue the overlapping circles, moving downward over the breast until you feel your ribs below your breast.  Move one finger-width toward the center of the body. Continue to use the  inch (2 cm) overlapping circles to feel your breast as you move slowly up toward your collarbone.  Continue the up and down exam using all three pressures until you reach your armpit.  Write Down What You Find  Write down what is normal for each breast and any changes that you find. Keep a written record with breast changes or normal findings for each breast. By writing this information down, you do not need to depend only on memory for size, tenderness, or location. Write down where you are in your menstrual cycle, if you are still menstruating. If you are having trouble noticing differences   in your breasts, do not get discouraged. With time you will become more familiar with the variations in your breasts and more comfortable with the exam. How often should I examine my breasts? Examine your breasts every month. If you are breastfeeding, the best time to examine your breasts is after a feeding or after using a breast pump. If you menstruate, the best time to examine your breasts is 5-7 days after your period is over. During your period, your breasts are lumpier, and it may be more difficult to notice changes. When should I see my health care provider? See your health care provider if you notice:  A change in shape or size of your breasts or nipples.  A change in the skin of your breast or nipples, such as a reddened or scaly area.  Unusual discharge from your nipples.  A lump or thick area that was not there before.  Pain in your breasts.  Anything that concerns you.  

## 2018-10-10 DIAGNOSIS — Z85828 Personal history of other malignant neoplasm of skin: Secondary | ICD-10-CM | POA: Diagnosis not present

## 2018-10-10 DIAGNOSIS — L821 Other seborrheic keratosis: Secondary | ICD-10-CM | POA: Diagnosis not present

## 2018-10-10 DIAGNOSIS — L218 Other seborrheic dermatitis: Secondary | ICD-10-CM | POA: Diagnosis not present

## 2018-10-10 DIAGNOSIS — L570 Actinic keratosis: Secondary | ICD-10-CM | POA: Diagnosis not present

## 2018-10-10 DIAGNOSIS — D1801 Hemangioma of skin and subcutaneous tissue: Secondary | ICD-10-CM | POA: Diagnosis not present

## 2018-10-10 DIAGNOSIS — L820 Inflamed seborrheic keratosis: Secondary | ICD-10-CM | POA: Diagnosis not present

## 2018-10-29 DIAGNOSIS — M7710 Lateral epicondylitis, unspecified elbow: Secondary | ICD-10-CM | POA: Diagnosis not present

## 2018-10-29 DIAGNOSIS — Z6841 Body Mass Index (BMI) 40.0 and over, adult: Secondary | ICD-10-CM | POA: Diagnosis not present

## 2018-10-29 DIAGNOSIS — Z0000 Encounter for general adult medical examination without abnormal findings: Secondary | ICD-10-CM | POA: Diagnosis not present

## 2018-10-29 DIAGNOSIS — I129 Hypertensive chronic kidney disease with stage 1 through stage 4 chronic kidney disease, or unspecified chronic kidney disease: Secondary | ICD-10-CM | POA: Diagnosis not present

## 2018-10-29 DIAGNOSIS — N183 Chronic kidney disease, stage 3 (moderate): Secondary | ICD-10-CM | POA: Diagnosis not present

## 2018-10-29 DIAGNOSIS — E785 Hyperlipidemia, unspecified: Secondary | ICD-10-CM | POA: Diagnosis not present

## 2019-01-03 DIAGNOSIS — R69 Illness, unspecified: Secondary | ICD-10-CM | POA: Diagnosis not present

## 2019-01-08 DIAGNOSIS — H524 Presbyopia: Secondary | ICD-10-CM | POA: Diagnosis not present

## 2019-01-08 DIAGNOSIS — H52221 Regular astigmatism, right eye: Secondary | ICD-10-CM | POA: Diagnosis not present

## 2019-01-08 DIAGNOSIS — H52222 Regular astigmatism, left eye: Secondary | ICD-10-CM | POA: Diagnosis not present

## 2019-01-08 DIAGNOSIS — H5201 Hypermetropia, right eye: Secondary | ICD-10-CM | POA: Diagnosis not present

## 2019-01-14 DIAGNOSIS — Z0100 Encounter for examination of eyes and vision without abnormal findings: Secondary | ICD-10-CM | POA: Diagnosis not present

## 2019-03-06 DIAGNOSIS — R69 Illness, unspecified: Secondary | ICD-10-CM | POA: Diagnosis not present

## 2019-07-01 ENCOUNTER — Other Ambulatory Visit: Payer: Self-pay | Admitting: Obstetrics and Gynecology

## 2019-07-01 DIAGNOSIS — Z1231 Encounter for screening mammogram for malignant neoplasm of breast: Secondary | ICD-10-CM

## 2019-07-11 DIAGNOSIS — H35033 Hypertensive retinopathy, bilateral: Secondary | ICD-10-CM | POA: Diagnosis not present

## 2019-07-11 DIAGNOSIS — H25812 Combined forms of age-related cataract, left eye: Secondary | ICD-10-CM | POA: Diagnosis not present

## 2019-07-11 DIAGNOSIS — H2513 Age-related nuclear cataract, bilateral: Secondary | ICD-10-CM | POA: Diagnosis not present

## 2019-07-11 DIAGNOSIS — H43812 Vitreous degeneration, left eye: Secondary | ICD-10-CM | POA: Diagnosis not present

## 2019-07-18 DIAGNOSIS — R69 Illness, unspecified: Secondary | ICD-10-CM | POA: Diagnosis not present

## 2019-07-25 ENCOUNTER — Ambulatory Visit
Admission: RE | Admit: 2019-07-25 | Discharge: 2019-07-25 | Disposition: A | Discharge status: Home / Self Care | Payer: Medicare HMO | Source: Ambulatory Visit | Attending: Obstetrics and Gynecology | Admitting: Obstetrics and Gynecology

## 2019-07-25 ENCOUNTER — Other Ambulatory Visit: Payer: Self-pay

## 2019-07-25 DIAGNOSIS — Z1231 Encounter for screening mammogram for malignant neoplasm of breast: Secondary | ICD-10-CM

## 2019-07-25 HISTORY — DX: Other signs and symptoms in breast: N64.59

## 2019-08-02 DIAGNOSIS — I10 Essential (primary) hypertension: Secondary | ICD-10-CM | POA: Diagnosis not present

## 2019-08-02 DIAGNOSIS — R8290 Unspecified abnormal findings in urine: Secondary | ICD-10-CM | POA: Diagnosis not present

## 2019-08-02 DIAGNOSIS — R109 Unspecified abdominal pain: Secondary | ICD-10-CM | POA: Diagnosis not present

## 2019-08-03 DIAGNOSIS — I10 Essential (primary) hypertension: Secondary | ICD-10-CM | POA: Diagnosis not present

## 2019-08-03 DIAGNOSIS — R109 Unspecified abdominal pain: Secondary | ICD-10-CM | POA: Diagnosis not present

## 2019-08-03 DIAGNOSIS — D72829 Elevated white blood cell count, unspecified: Secondary | ICD-10-CM | POA: Diagnosis not present

## 2019-08-27 NOTE — Progress Notes (Signed)
68 y.o. G0P0000 Single White or Caucasian Not Hispanic or Latino female here for annual exam.  H/O TAH/BSO. No bleeding, no bowel or bladder changes.     Patient's last menstrual period was 06/12/2000.          Sexually active: No.  The current method of family planning is post menopausal status.    Exercising: No.  The patient does not participate in regular exercise at present. Smoker:  no  Health Maintenance: Pap:  07/02/15 [facility] - per patient normal History of abnormal Pap:  no MMG:  07/28/19 Bi-rads 1 neg Density C  BMD:   11/26/2017 mild osteopenia, T score -1.2, FRAX 14.3/0.8% Colonoscopy: 11/23/2017 polyps, f/u 2024 TDaP:  06/25/2018 Gardasil: NA   reports that she has never smoked. She has never used smokeless tobacco. She reports current alcohol use of about 1.0 - 5.0 standard drinks of alcohol per week. She reports that she does not use drugs. 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 Depression   . Dysmenorrhea   . Fibroid   . Hyperlipemia   . Hyperlipidemia   . Hypertension   . Inverted nipple x several yrs   bilateral, pt states nipples have always been inverted    Past Surgical History:  Procedure Laterality Date  . BLEPHAROPLASTY Bilateral 02/21/2008  . BREAST BIOPSY Right   . BREAST EXCISIONAL BIOPSY Right    benign  . PELVIC LAPAROSCOPY    . TOTAL ABDOMINAL HYSTERECTOMY  05/04/2001    Current Outpatient Medications  Medication Sig Dispense Refill  . aspirin EC 81 MG tablet Take 81 mg by mouth daily.    . Calcium Carbonate-Vit D-Min (CALCIUM 1200 PO) Take by mouth daily.    . cholecalciferol (VITAMIN D) 1000 units tablet Take 1,000 Units by mouth daily.    . hydrochlorothiazide (HYDRODIURIL) 25 MG tablet Take 1 tablet by mouth daily.    Marland Kitchen losartan (COZAAR) 100 MG tablet Take 1 tablet by mouth daily.  1  . simvastatin (ZOCOR) 20 MG tablet Take 1  tablet by mouth daily.  6   No current facility-administered medications for this visit.  On a probiotic for her bowels, working well.   Family History  Problem Relation Age of Onset  . Hypertension Mother   . Alzheimer's disease Mother   . Heart disease Mother   . Hyperlipidemia Mother   . Hypertension Father   . Diabetes Father   . Stroke Father   . Breast cancer Sister   . Hypertension Sister   . Breast cancer Sister   . Hypertension Sister   . Hypertension Sister   She had 4 sisters, one drowned in her early 24's. 2 sisters with breast cancer, one was premenopausal, one was postmenopausal. The PMP sister had negative genetic testing.   Review of Systems  Constitutional: Negative.   HENT: Positive for postnasal drip and sneezing.   Eyes: Negative.   Respiratory: Negative.   Cardiovascular: Negative.   Gastrointestinal: Negative.   Genitourinary: Negative.   Musculoskeletal: Negative.   Allergic/Immunologic: Negative.   Neurological: Negative.   Hematological: Negative.   Psychiatric/Behavioral: Negative.     Exam:   LMP 06/12/2000   Weight change: @WEIGHTCHANGE @ Height:      Ht Readings from Last 3 Encounters:  08/22/18 5\' 4"  (1.626 m)  08/03/17 5\' 4"  (1.626 m)  07/05/16 5' 4.25" (1.632 m)  General appearance: alert, cooperative and appears stated age Head: Normocephalic, without obvious abnormality, atraumatic Neck: no adenopathy, supple, symmetrical, trachea midline and thyroid normal to inspection and palpation Lungs: clear to auscultation bilaterally Cardiovascular: regular rate and rhythm, systolic murmur, RSB (has had an echo) Breasts: normal appearance, no masses or tenderness Abdomen: soft, non-tender; non distended,  no masses,  no organomegaly Extremities: extremities normal, atraumatic, no cyanosis or edema Skin: Skin color, texture, turgor normal. No rashes or lesions Lymph nodes: Cervical, supraclavicular, and axillary nodes normal. No abnormal  inguinal nodes palpated Neurologic: Grossly normal   Pelvic: External genitalia:  no lesions              Urethra:  normal appearing urethra with no masses, tenderness or lesions              Bartholins and Skenes: normal                 Vagina: normal appearing vagina with normal color and discharge, no lesions              Cervix: absent               Bimanual Exam:  Uterus:  uterus absent              Adnexa: no mass, fullness, tenderness               Rectovaginal: Confirms               Anus:  normal sphincter tone, no lesions  Gae Dry chaperoned for the exam.  A:  Well Woman with normal exam  FH of breast cancer, declines referral to genetics (discussed how it could change her screening)  Mild osteopenia  HTN and lipids managed with her primary  P:   No pap needed  Mammogram UTD  DEXA in the next 1-3 years  Colonoscopy in 2024  Discussed breast self exam  Discussed calcium and vit D intake  Labs with primary

## 2019-08-28 ENCOUNTER — Ambulatory Visit (INDEPENDENT_AMBULATORY_CARE_PROVIDER_SITE_OTHER): Payer: Medicare HMO | Admitting: Obstetrics and Gynecology

## 2019-08-28 ENCOUNTER — Other Ambulatory Visit: Payer: Self-pay

## 2019-08-28 ENCOUNTER — Encounter: Payer: Self-pay | Admitting: Obstetrics and Gynecology

## 2019-08-28 VITALS — BP 110/64 | HR 93 | Temp 98.0°F | Ht 64.0 in | Wt 230.0 lb

## 2019-08-28 DIAGNOSIS — Z803 Family history of malignant neoplasm of breast: Secondary | ICD-10-CM

## 2019-08-28 DIAGNOSIS — Z01419 Encounter for gynecological examination (general) (routine) without abnormal findings: Secondary | ICD-10-CM | POA: Diagnosis not present

## 2019-08-28 NOTE — Patient Instructions (Signed)
EXERCISE AND DIET:  We recommended that you start or continue a regular exercise program for good health. Regular exercise means any activity that makes your heart beat faster and makes you sweat.  We recommend exercising at least 30 minutes per day at least 3 days a week, preferably 4 or 5.  We also recommend a diet low in fat and sugar.  Inactivity, poor dietary choices and obesity can cause diabetes, heart attack, stroke, and kidney damage, among others.    ALCOHOL AND SMOKING:  Women should limit their alcohol intake to no more than 7 drinks/beers/glasses of wine (combined, not each!) per week. Moderation of alcohol intake to this level decreases your risk of breast cancer and liver damage. And of course, no recreational drugs are part of a healthy lifestyle.  And absolutely no smoking or even second hand smoke. Most people know smoking can cause heart and lung diseases, but did you know it also contributes to weakening of your bones? Aging of your skin?  Yellowing of your teeth and nails?  CALCIUM AND VITAMIN D:  Adequate intake of calcium and Vitamin D are recommended.  The recommendations for exact amounts of these supplements seem to change often, but generally speaking 1,200 mg of calcium (between diet and supplement) and 800 units of Vitamin D per day seems prudent. Certain women may benefit from higher intake of Vitamin D.  If you are among these women, your doctor will have told you during your visit.    PAP SMEARS:  Pap smears, to check for cervical cancer or precancers,  have traditionally been done yearly, although recent scientific advances have shown that most women can have pap smears less often.  However, every woman still should have a physical exam from her gynecologist every year. It will include a breast check, inspection of the vulva and vagina to check for abnormal growths or skin changes, a visual exam of the cervix, and then an exam to evaluate the size and shape of the uterus and  ovaries.  And after 68 years of age, a rectal exam is indicated to check for rectal cancers. We will also provide age appropriate advice regarding health maintenance, like when you should have certain vaccines, screening for sexually transmitted diseases, bone density testing, colonoscopy, mammograms, etc.   MAMMOGRAMS:  All women over 40 years old should have a yearly mammogram. Many facilities now offer a "3D" mammogram, which may cost around $50 extra out of pocket. If possible,  we recommend you accept the option to have the 3D mammogram performed.  It both reduces the number of women who will be called back for extra views which then turn out to be normal, and it is better than the routine mammogram at detecting truly abnormal areas.    COLON CANCER SCREENING: Now recommend starting at age 45. At this time colonoscopy is not covered for routine screening until 50. There are take home tests that can be done between 45-49.   COLONOSCOPY:  Colonoscopy to screen for colon cancer is recommended for all women at age 50.  We know, you hate the idea of the prep.  We agree, BUT, having colon cancer and not knowing it is worse!!  Colon cancer so often starts as a polyp that can be seen and removed at colonscopy, which can quite literally save your life!  And if your first colonoscopy is normal and you have no family history of colon cancer, most women don't have to have it again for   10 years.  Once every ten years, you can do something that may end up saving your life, right?  We will be happy to help you get it scheduled when you are ready.  Be sure to check your insurance coverage so you understand how much it will cost.  It may be covered as a preventative service at no cost, but you should check your particular policy.      Breast Self-Awareness Breast self-awareness means being familiar with how your breasts look and feel. It involves checking your breasts regularly and reporting any changes to your  health care provider. Practicing breast self-awareness is important. A change in your breasts can be a sign of a serious medical problem. Being familiar with how your breasts look and feel allows you to find any problems early, when treatment is more likely to be successful. All women should practice breast self-awareness, including women who have had breast implants. How to do a breast self-exam One way to learn what is normal for your breasts and whether your breasts are changing is to do a breast self-exam. To do a breast self-exam: Look for Changes  1. Remove all the clothing above your waist. 2. Stand in front of a mirror in a room with good lighting. 3. Put your hands on your hips. 4. Push your hands firmly downward. 5. Compare your breasts in the mirror. Look for differences between them (asymmetry), such as: ? Differences in shape. ? Differences in size. ? Puckers, dips, and bumps in one breast and not the other. 6. Look at each breast for changes in your skin, such as: ? Redness. ? Scaly areas. 7. Look for changes in your nipples, such as: ? Discharge. ? Bleeding. ? Dimpling. ? Redness. ? A change in position. Feel for Changes Carefully feel your breasts for lumps and changes. It is best to do this while lying on your back on the floor and again while sitting or standing in the shower or tub with soapy water on your skin. Feel each breast in the following way:  Place the arm on the side of the breast you are examining above your head.  Feel your breast with the other hand.  Start in the nipple area and make  inch (2 cm) overlapping circles to feel your breast. Use the pads of your three middle fingers to do this. Apply light pressure, then medium pressure, then firm pressure. The light pressure will allow you to feel the tissue closest to the skin. The medium pressure will allow you to feel the tissue that is a little deeper. The firm pressure will allow you to feel the tissue  close to the ribs.  Continue the overlapping circles, moving downward over the breast until you feel your ribs below your breast.  Move one finger-width toward the center of the body. Continue to use the  inch (2 cm) overlapping circles to feel your breast as you move slowly up toward your collarbone.  Continue the up and down exam using all three pressures until you reach your armpit.  Write Down What You Find  Write down what is normal for each breast and any changes that you find. Keep a written record with breast changes or normal findings for each breast. By writing this information down, you do not need to depend only on memory for size, tenderness, or location. Write down where you are in your menstrual cycle, if you are still menstruating. If you are having trouble noticing differences   in your breasts, do not get discouraged. With time you will become more familiar with the variations in your breasts and more comfortable with the exam. How often should I examine my breasts? Examine your breasts every month. If you are breastfeeding, the best time to examine your breasts is after a feeding or after using a breast pump. If you menstruate, the best time to examine your breasts is 5-7 days after your period is over. During your period, your breasts are lumpier, and it may be more difficult to notice changes. When should I see my health care provider? See your health care provider if you notice:  A change in shape or size of your breasts or nipples.  A change in the skin of your breast or nipples, such as a reddened or scaly area.  Unusual discharge from your nipples.  A lump or thick area that was not there before.  Pain in your breasts.  Anything that concerns you.  

## 2019-10-06 DIAGNOSIS — L570 Actinic keratosis: Secondary | ICD-10-CM | POA: Diagnosis not present

## 2019-10-06 DIAGNOSIS — D225 Melanocytic nevi of trunk: Secondary | ICD-10-CM | POA: Diagnosis not present

## 2019-10-06 DIAGNOSIS — L814 Other melanin hyperpigmentation: Secondary | ICD-10-CM | POA: Diagnosis not present

## 2019-10-06 DIAGNOSIS — D485 Neoplasm of uncertain behavior of skin: Secondary | ICD-10-CM | POA: Diagnosis not present

## 2019-10-06 DIAGNOSIS — Z85828 Personal history of other malignant neoplasm of skin: Secondary | ICD-10-CM | POA: Diagnosis not present

## 2019-10-06 DIAGNOSIS — D1801 Hemangioma of skin and subcutaneous tissue: Secondary | ICD-10-CM | POA: Diagnosis not present

## 2019-10-06 DIAGNOSIS — L820 Inflamed seborrheic keratosis: Secondary | ICD-10-CM | POA: Diagnosis not present

## 2019-10-06 DIAGNOSIS — D2272 Melanocytic nevi of left lower limb, including hip: Secondary | ICD-10-CM | POA: Diagnosis not present

## 2019-10-06 DIAGNOSIS — L821 Other seborrheic keratosis: Secondary | ICD-10-CM | POA: Diagnosis not present

## 2019-10-31 DIAGNOSIS — Z85828 Personal history of other malignant neoplasm of skin: Secondary | ICD-10-CM | POA: Diagnosis not present

## 2019-10-31 DIAGNOSIS — M859 Disorder of bone density and structure, unspecified: Secondary | ICD-10-CM | POA: Diagnosis not present

## 2019-10-31 DIAGNOSIS — Z86010 Personal history of colonic polyps: Secondary | ICD-10-CM | POA: Diagnosis not present

## 2019-10-31 DIAGNOSIS — I129 Hypertensive chronic kidney disease with stage 1 through stage 4 chronic kidney disease, or unspecified chronic kidney disease: Secondary | ICD-10-CM | POA: Diagnosis not present

## 2019-10-31 DIAGNOSIS — E785 Hyperlipidemia, unspecified: Secondary | ICD-10-CM | POA: Diagnosis not present

## 2019-10-31 DIAGNOSIS — Z0000 Encounter for general adult medical examination without abnormal findings: Secondary | ICD-10-CM | POA: Diagnosis not present

## 2019-10-31 DIAGNOSIS — N183 Chronic kidney disease, stage 3 unspecified: Secondary | ICD-10-CM | POA: Diagnosis not present

## 2019-10-31 DIAGNOSIS — R739 Hyperglycemia, unspecified: Secondary | ICD-10-CM | POA: Diagnosis not present

## 2019-10-31 DIAGNOSIS — Z6841 Body Mass Index (BMI) 40.0 and over, adult: Secondary | ICD-10-CM | POA: Diagnosis not present

## 2019-10-31 LAB — COMPREHENSIVE METABOLIC PANEL
Albumin: 4.3 (ref 3.5–5.0)
Calcium: 9.2 (ref 8.7–10.7)
GFR calc Af Amer: 92
GFR calc non Af Amer: 76

## 2019-10-31 LAB — HEPATIC FUNCTION PANEL
ALT: 19 (ref 7–35)
AST: 18 (ref 13–35)
Alkaline Phosphatase: 55 (ref 25–125)
Bilirubin, Total: 0.6

## 2019-10-31 LAB — HEMOGLOBIN A1C: Hemoglobin A1C: 6.5

## 2019-10-31 LAB — BASIC METABOLIC PANEL
BUN: 13 (ref 4–21)
CO2: 28 — AB (ref 13–22)
Chloride: 105 (ref 99–108)
Creatinine: 0.8 (ref 0.5–1.1)
Glucose: 103
Potassium: 4 (ref 3.4–5.3)
Sodium: 143 (ref 137–147)

## 2019-10-31 LAB — VITAMIN D 25 HYDROXY (VIT D DEFICIENCY, FRACTURES): Vit D, 25-Hydroxy: 29.8

## 2019-10-31 LAB — TSH: TSH: 1.64 (ref 0.41–5.90)

## 2019-10-31 LAB — LIPID PANEL
Cholesterol: 173 (ref 0–200)
HDL: 57 (ref 35–70)
LDL Cholesterol: 97
Triglycerides: 107 (ref 40–160)

## 2019-12-09 ENCOUNTER — Encounter (INDEPENDENT_AMBULATORY_CARE_PROVIDER_SITE_OTHER): Payer: Self-pay

## 2019-12-09 ENCOUNTER — Other Ambulatory Visit: Payer: Self-pay

## 2019-12-09 ENCOUNTER — Ambulatory Visit (INDEPENDENT_AMBULATORY_CARE_PROVIDER_SITE_OTHER): Payer: Medicare HMO | Admitting: Family Medicine

## 2019-12-09 ENCOUNTER — Encounter (INDEPENDENT_AMBULATORY_CARE_PROVIDER_SITE_OTHER): Payer: Self-pay | Admitting: Family Medicine

## 2019-12-09 VITALS — BP 132/89 | HR 86 | Temp 98.2°F | Ht 64.0 in | Wt 226.0 lb

## 2019-12-09 DIAGNOSIS — Z6838 Body mass index (BMI) 38.0-38.9, adult: Secondary | ICD-10-CM | POA: Diagnosis not present

## 2019-12-09 DIAGNOSIS — R5383 Other fatigue: Secondary | ICD-10-CM | POA: Diagnosis not present

## 2019-12-09 DIAGNOSIS — R0602 Shortness of breath: Secondary | ICD-10-CM

## 2019-12-09 DIAGNOSIS — Z1331 Encounter for screening for depression: Secondary | ICD-10-CM

## 2019-12-09 DIAGNOSIS — R739 Hyperglycemia, unspecified: Secondary | ICD-10-CM | POA: Diagnosis not present

## 2019-12-09 DIAGNOSIS — Z0289 Encounter for other administrative examinations: Secondary | ICD-10-CM

## 2019-12-10 LAB — COMPREHENSIVE METABOLIC PANEL
ALT: 20 IU/L (ref 0–32)
AST: 21 IU/L (ref 0–40)
Albumin/Globulin Ratio: 2.2 (ref 1.2–2.2)
Albumin: 4.8 g/dL (ref 3.8–4.8)
Alkaline Phosphatase: 79 IU/L (ref 48–121)
BUN/Creatinine Ratio: 17 (ref 12–28)
BUN: 13 mg/dL (ref 8–27)
Bilirubin Total: 0.5 mg/dL (ref 0.0–1.2)
CO2: 25 mmol/L (ref 20–29)
Calcium: 9.5 mg/dL (ref 8.7–10.3)
Chloride: 101 mmol/L (ref 96–106)
Creatinine, Ser: 0.75 mg/dL (ref 0.57–1.00)
GFR calc Af Amer: 95 mL/min/{1.73_m2} (ref >59)
GFR calc non Af Amer: 82 mL/min/{1.73_m2} (ref >59)
Globulin, Total: 2.2 g/dL (ref 1.5–4.5)
Glucose: 107 mg/dL — ABNORMAL HIGH (ref 65–99)
Potassium: 4.3 mmol/L (ref 3.5–5.2)
Sodium: 143 mmol/L (ref 134–144)
Total Protein: 7 g/dL (ref 6.0–8.5)

## 2019-12-10 LAB — INSULIN, RANDOM: INSULIN: 24.9 u[IU]/mL (ref 2.6–24.9)

## 2019-12-10 NOTE — Progress Notes (Signed)
Chief Complaint:   OBESITY Tina Sutton (MR# [id number]) is a 68 y.o. female who presents for evaluation and treatment of obesity and related comorbidities. Current BMI is Body mass index is 38.79 kg/m. Tina Sutton has been struggling with her weight for many years and has been unsuccessful in either losing weight, maintaining weight loss, or reaching her healthy weight goal.  Tina Sutton is currently in the action stage of change and ready to dedicate time achieving and maintaining a healthier weight. Tina Sutton is interested in becoming our patient and working on intensive lifestyle modifications including (but not limited to) diet and exercise for weight loss.  Tina Sutton's habits were reviewed today and are as follows: her desired weight loss is 81 lbs, she started gaining weight in 1986, her heaviest weight ever was 242 pounds, she has significant food cravings issues, she snacks frequently in the evenings, she skips meals frequently, she is frequently drinking liquids with calories, she frequently eats larger portions than normal and she struggles with emotional eating.  Depression Screen Tina Sutton's Food and Mood (modified PHQ-9) score was 10.  Depression screen PHQ 2/9 12/09/2019  Decreased Interest 1  Down, Depressed, Hopeless 1  PHQ - 2 Score 2  Altered sleeping 0  Tired, decreased energy 3  Change in appetite 1  Feeling bad or failure about yourself  3  Trouble concentrating 0  Moving slowly or fidgety/restless 0  Suicidal thoughts 0  PHQ-9 Score 9  Difficult doing work/chores Somewhat difficult   Subjective:   1. Other fatigue Tina Sutton admits to daytime somnolence and denies waking up still tired. Patent has a history of symptoms of daytime fatigue. Tina Sutton generally gets 7 hours of sleep per night, and states that she has generally restful sleep. Snoring is present. Apneic episodes are not present. Epworth Sleepiness Score is 9.  2. Shortness of breath on exertion Tina Sutton  notes increasing shortness of breath with exercising and seems to be worsening over time with weight gain. She notes getting out of breath sooner with activity than she used to. This has not gotten worse recently. Tina Sutton denies shortness of breath at rest or orthopnea.  3. Hyperglycemia Tina Sutton notes a family history of diabetes mellitus with her father. She had a recent A1c at her primary care physician's office in [place] at 6.5, and was diagnosed with diabetes mellitus on her labs.  Assessment/Plan:   1. Other fatigue Tina Sutton does feel that her weight is causing her energy to be lower than it should be. Fatigue may be related to obesity, depression or many other causes. Labs will be ordered, and in the meanwhile, Tina Sutton will focus on self care including making healthy food choices, increasing physical activity and focusing on stress reduction.  - EKG 12-Lead - Comprehensive metabolic panel - Insulin, random  2. Shortness of breath on exertion Tina Sutton does feel that she gets out of breath more easily that she used to when she exercises. Tina Sutton's shortness of breath appears to be obesity related and exercise induced. She has agreed to work on weight loss and gradually increase exercise to treat her exercise induced shortness of breath. Will continue to monitor closely.  3. Hyperglycemia Fasting insulin will be obtained today, and results with be discussed with Tina Sutton in 2 weeks at her follow up visit. In the meanwhile Tina Sutton will start her eating plan and will work on weight loss efforts.  - Comprehensive metabolic panel - Insulin, random  4. Depression screening Tina Sutton had a positive depression screening. Depression  is commonly associated with obesity and often results in emotional eating behaviors. We will monitor this closely and work on CBT to help improve the non-hunger eating patterns. Referral to Psychology may be required if no improvement is seen as she continues in our  clinic.  5. Class 2 severe obesity with serious comorbidity and body mass index (BMI) of 38.0 to 38.9 in adult, unspecified obesity type (HCC) Tina Sutton is currently in the action stage of change and her goal is to continue with weight loss efforts. I recommend Tina Sutton begin the structured treatment plan as follows:  She has agreed to the Category 2 Plan + 100 calories.  Exercise goals: No exercise has been prescribed for now, while we concentrate on nutritional changes.   Behavioral modification strategies: increasing lean protein intake, decreasing simple carbohydrates and emotional eating strategies.  She was informed of the importance of frequent follow-up visits to maximize her success with intensive lifestyle modifications for her multiple health conditions. She was informed we would discuss her lab results at her next visit unless there is a critical issue that needs to be addressed sooner. Tina Sutton agreed to keep her next visit at the agreed upon time to discuss these results.  Objective:   Blood pressure 132/89, pulse 86, temperature 98.2 F (36.8 C), temperature source Oral, height 5\' 4"  (1.626 m), weight 226 lb (102.5 kg), last menstrual period 06/12/2000, SpO2 96 %. Body mass index is 38.79 kg/m.  EKG: Normal sinus rhythm, rate 94 BPM.  Indirect Calorimeter completed today shows a VO2 of 289 and a REE of 2008.  Her calculated basal metabolic rate is 2694 thus her basal metabolic rate is better than expected.  General: Cooperative, alert, well developed, in no acute distress. HEENT: Conjunctivae and lids unremarkable. Cardiovascular: Regular rhythm.  Lungs: Normal work of breathing. Neurologic: No focal deficits.   Lab Results  Component Value Date   CREATININE 0.75 12/09/2019   BUN 13 12/09/2019   NA 143 12/09/2019   K 4.3 12/09/2019   CL 101 12/09/2019   CO2 25 12/09/2019   Lab Results  Component Value Date   ALT 20 12/09/2019   AST 21 12/09/2019   ALKPHOS 79  12/09/2019   BILITOT 0.5 12/09/2019   Lab Results  Component Value Date   HGBA1C 6.5 10/31/2019   Lab Results  Component Value Date   INSULIN 24.9 12/09/2019   Lab Results  Component Value Date   TSH 1.64 10/31/2019   Lab Results  Component Value Date   CHOL 173 10/31/2019   HDL 57 10/31/2019   LDLCALC 97 10/31/2019   TRIG 107 10/31/2019   No results found for: WBC, HGB, HCT, MCV, PLT No results found for: IRON, TIBC, FERRITIN Obesity Behavioral Intervention Visit Documentation for Insurance:   Approximately 15 minutes were spent on the discussion below.  ASK: We discussed the diagnosis of obesity with Tina Sutton today and Tina Sutton agreed to give [name] permission to discuss obesity behavioral modification therapy today.  ASSESS: Tina Sutton has the diagnosis of obesity and her BMI today is 38.77. Tina Sutton is in the action stage of change.   ADVISE: Tina Sutton was educated on the multiple health risks of obesity as well as the benefit of weight loss to improve her health. She was advised of the need for long term treatment and the importance of lifestyle modifications to improve her current health and to decrease her risk of future health problems.  AGREE: Multiple dietary modification options and treatment options were discussed and  Tina Sutton agreed to follow the recommendations documented in the above note.  ARRANGE: Tina Sutton was educated on the importance of frequent visits to treat obesity as outlined per CMS and USPSTF guidelines and agreed to schedule her next follow up appointment today.  Attestation Statements:   Reviewed by clinician on day of visit: allergies, medications, problem list, medical history, surgical history, family history, social history, and previous encounter notes.   I, Trixie Dredge, am acting as transcriptionist for [name], MD.  I have reviewed the above documentation for accuracy and completeness, and I agree with the above. - [name],  MD

## 2019-12-23 ENCOUNTER — Other Ambulatory Visit: Payer: Self-pay

## 2019-12-23 ENCOUNTER — Ambulatory Visit (INDEPENDENT_AMBULATORY_CARE_PROVIDER_SITE_OTHER): Payer: Medicare HMO | Admitting: Family Medicine

## 2019-12-23 ENCOUNTER — Encounter (INDEPENDENT_AMBULATORY_CARE_PROVIDER_SITE_OTHER): Payer: Self-pay | Admitting: Family Medicine

## 2019-12-23 VITALS — BP 113/77 | HR 95 | Temp 98.1°F | Ht 64.0 in | Wt 220.0 lb

## 2019-12-23 DIAGNOSIS — E1169 Type 2 diabetes mellitus with other specified complication: Secondary | ICD-10-CM

## 2019-12-23 DIAGNOSIS — Z6837 Body mass index (BMI) 37.0-37.9, adult: Secondary | ICD-10-CM

## 2019-12-25 NOTE — Progress Notes (Signed)
Chief Complaint:   OBESITY Tina Sutton is here to discuss her progress with her obesity treatment plan along with follow-up of her obesity related diagnoses. Tina Sutton is on the Category 2 Plan + 100 calories and states she is following her eating plan approximately 80-90% of the time. Tina Sutton states she is doing 0 minutes 0 times per week.  Today's visit was #: 2 Starting weight: 226 lbs Starting date: 12/09/2019 Today's weight: 220 lbs Today's date: 12/23/2019 Total lbs lost to date: 6 Total lbs lost since last in-office visit: 6  Interim History: Tina Sutton has done well with weight loss on her Category 2 plan. Her hunger was controlled and she was able to eat most of her food. She did well with meal planning.  Subjective:   1. Type 2 diabetes mellitus with other specified complication, without long-term current use of insulin (HCC) Tina Sutton's last A1c was 6.5, and she is doing well with diet and weight loss. She denies hypoglycemia. Her fasting insulin is elevated. I discussed labs with the patient today.  Assessment/Plan:   1. Type 2 diabetes mellitus with other specified complication, without long-term current use of insulin (HCC) Good blood sugar control is important to decrease the likelihood of diabetic complications such as nephropathy, neuropathy, limb loss, blindness, coronary artery disease, and death. Intensive lifestyle modification including diet, exercise and weight loss are the first line of treatment for diabetes.   2. Class 2 severe obesity with serious comorbidity and body mass index (BMI) of 37.0 to 37.9 in adult, unspecified obesity type (HCC) Tina Sutton is currently in the action stage of change. As such, her goal is to continue with weight loss efforts. She has agreed to the Category 2 Plan + 100 calories.    Lean meat equivalents were discussed.  Behavioral modification strategies: increasing lean protein intake and meal planning and cooking strategies.  Tina Sutton has  agreed to follow-up with our clinic in 2 weeks. She was informed of the importance of frequent follow-up visits to maximize her success with intensive lifestyle modifications for her multiple health conditions.   Objective:   Blood pressure 113/77, pulse 95, temperature 98.1 F (36.7 C), temperature source Oral, height 5\' 4"  (1.626 m), weight 220 lb (99.8 kg), last menstrual period 06/12/2000, SpO2 96 %. Body mass index is 37.76 kg/m.  General: Cooperative, alert, well developed, in no acute distress. HEENT: Conjunctivae and lids unremarkable. Cardiovascular: Regular rhythm.  Lungs: Normal work of breathing. Neurologic: No focal deficits.   Lab Results  Component Value Date   CREATININE 0.75 12/09/2019   BUN 13 12/09/2019   NA 143 12/09/2019   K 4.3 12/09/2019   CL 101 12/09/2019   CO2 25 12/09/2019   Lab Results  Component Value Date   ALT 20 12/09/2019   AST 21 12/09/2019   ALKPHOS 79 12/09/2019   BILITOT 0.5 12/09/2019   Lab Results  Component Value Date   HGBA1C 6.5 10/31/2019   Lab Results  Component Value Date   INSULIN 24.9 12/09/2019   Lab Results  Component Value Date   TSH 1.64 10/31/2019   Lab Results  Component Value Date   CHOL 173 10/31/2019   HDL 57 10/31/2019   LDLCALC 97 10/31/2019   TRIG 107 10/31/2019   No results found for: WBC, HGB, HCT, MCV, PLT No results found for: IRON, TIBC, FERRITIN  Attestation Statements:   Reviewed by clinician on day of visit: allergies, medications, problem list, medical history, surgical history, family history, social  history, and previous encounter notes.  Time spent on visit including pre-visit chart review and post-visit care and charting was 42 minutes.   I, Trixie Dredge, am acting as transcriptionist for [name], MD.  I have reviewed the above documentation for accuracy and completeness, and I agree with the above. -  [name], MD

## 2020-01-06 ENCOUNTER — Ambulatory Visit (INDEPENDENT_AMBULATORY_CARE_PROVIDER_SITE_OTHER): Payer: Medicare HMO | Admitting: Physician Assistant

## 2020-01-06 ENCOUNTER — Other Ambulatory Visit: Payer: Self-pay

## 2020-01-06 ENCOUNTER — Encounter (INDEPENDENT_AMBULATORY_CARE_PROVIDER_SITE_OTHER): Payer: Self-pay | Admitting: Physician Assistant

## 2020-01-06 VITALS — BP 119/81 | HR 90 | Temp 97.8°F | Ht 64.0 in | Wt 218.0 lb

## 2020-01-06 DIAGNOSIS — E785 Hyperlipidemia, unspecified: Secondary | ICD-10-CM

## 2020-01-06 DIAGNOSIS — Z6837 Body mass index (BMI) 37.0-37.9, adult: Secondary | ICD-10-CM

## 2020-01-06 DIAGNOSIS — E1169 Type 2 diabetes mellitus with other specified complication: Secondary | ICD-10-CM | POA: Diagnosis not present

## 2020-01-06 DIAGNOSIS — E559 Vitamin D deficiency, unspecified: Secondary | ICD-10-CM | POA: Diagnosis not present

## 2020-01-07 NOTE — Progress Notes (Signed)
Chief Complaint:   [place] is here to discuss her progress with her obesity treatment plan along with follow-up of her obesity related diagnoses. Gianni is on the Category 2 Plan + 100 calories and states she is following her eating plan approximately 80+% of the time. Shalice states she is exercising 0 minutes 0 times per week.  Today's visit was #: 3 Starting weight: 226 lbs Starting date: 12/09/2019 Today's weight: 218 lbs Today's date: 01/06/2020 Total lbs lost to date: 8 Total lbs lost since last in-office visit: 2  Interim History: Mija reports that she is getting in all of her protein daily, but sometimes skips her bread at a meal. She is slightly disappointed with a 2 lb weight loss and was hoping for more. She is very organized and keeps a check list of foods on her meal plan.   Subjective:   Vitamin D deficiency. Elenor is on OTC Vitamin D supplementation. No nausea, vomiting, or muscle weakness.    Ref. Range 10/31/2019 00:00  Vitamin D, 25-Hydroxy Unknown 29.8   Hyperlipidemia associated with type 2 diabetes mellitus ([place]). [place] is on no medication and denies hypoglycemia. She does not check blood sugars at home.   Lab Results  Component Value Date   HGBA1C 6.5 10/31/2019   Lab Results  Component Value Date   LDLCALC 97 10/31/2019   CREATININE 0.75 12/09/2019   Lab Results  Component Value Date   INSULIN 24.9 12/09/2019   Assessment/Plan:   Vitamin D deficiency. Low Vitamin D level contributes to fatigue and are associated with obesity, breast, and colon cancer. She agrees to continue to take OTC Vitamin D as directed and will follow-up for routine testing of Vitamin D, at least 2-3 times per year to avoid over-replacement.  Hyperlipidemia associated with type 2 diabetes mellitus ([place]). Good blood sugar control is important to decrease the likelihood of diabetic complications such as nephropathy, neuropathy, limb loss, blindness,  coronary artery disease, and death. Intensive lifestyle modification including diet, exercise and weight loss are the first line of treatment for diabetes.   Class 2 severe obesity with serious comorbidity and body mass index (BMI) of 37.0 to 37.9 in adult, unspecified obesity type ([place]).  Shalaya is currently in the action stage of change. As such, her goal is to continue with weight loss efforts. She has agreed to the Category 2 Plan + 100 calories.   Exercise goals: Older adults should follow the adult guidelines. When older adults cannot meet the adult guidelines, they should be as physically active as their abilities and conditions will allow.   Behavioral modification strategies: increasing lean protein intake and meal planning and cooking strategies.  Nyasiah has agreed to follow-up with our clinic in 2 weeks. She was informed of the importance of frequent follow-up visits to maximize her success with intensive lifestyle modifications for her multiple health conditions.   Objective:   Blood pressure 119/81, pulse 90, temperature 97.8 F (36.6 C), temperature source Oral, height 5\' 4"  (1.626 m), weight (!) 218 lb (98.9 kg), last menstrual period 06/12/2000, SpO2 97 %. Body mass index is 37.42 kg/m.  General: Cooperative, alert, well developed, in no acute distress. HEENT: Conjunctivae and lids unremarkable. Cardiovascular: Regular rhythm.  Lungs: Normal work of breathing. Neurologic: No focal deficits.   Lab Results  Component Value Date   CREATININE 0.75 12/09/2019   BUN 13 12/09/2019   NA 143 12/09/2019   K 4.3 12/09/2019   CL 101 12/09/2019  CO2 25 12/09/2019   Lab Results  Component Value Date   ALT 20 12/09/2019   AST 21 12/09/2019   ALKPHOS 79 12/09/2019   BILITOT 0.5 12/09/2019   Lab Results  Component Value Date   HGBA1C 6.5 10/31/2019   Lab Results  Component Value Date   INSULIN 24.9 12/09/2019   Lab Results  Component Value Date   TSH 1.64  10/31/2019   Lab Results  Component Value Date   CHOL 173 10/31/2019   HDL 57 10/31/2019   LDLCALC 97 10/31/2019   TRIG 107 10/31/2019   No results found for: WBC, HGB, HCT, MCV, PLT No results found for: IRON, TIBC, FERRITIN  Obesity Behavioral Intervention Documentation for Insurance:   Approximately 15 minutes were spent on the discussion below.  ASK: We discussed the diagnosis of obesity with Genevieve today and Modestine agreed to give [name] permission to discuss obesity behavioral modification therapy today.  ASSESS: Clair has the diagnosis of obesity and her BMI today is 37.5. Jury is in the action stage of change.   ADVISE: Myeesha was educated on the multiple health risks of obesity as well as the benefit of weight loss to improve her health. She was advised of the need for long term treatment and the importance of lifestyle modifications to improve her current health and to decrease her risk of future health problems.  AGREE: Multiple dietary modification options and treatment options were discussed and Chaunice agreed to follow the recommendations documented in the above note.  ARRANGE: Omnia was educated on the importance of frequent visits to treat obesity as outlined per CMS and USPSTF guidelines and agreed to schedule her next follow up appointment today.  Attestation Statements:   Reviewed by clinician on day of visit: allergies, medications, problem list, medical history, surgical history, family history, social history, and previous encounter notes.  IMichaelene Song, am acting as transcriptionist for Abby Potash, PA-C   I have reviewed the above documentation for accuracy and completeness, and I agree with the above. Abby Potash, PA-C

## 2020-01-16 DIAGNOSIS — H2513 Age-related nuclear cataract, bilateral: Secondary | ICD-10-CM | POA: Diagnosis not present

## 2020-01-16 DIAGNOSIS — H35033 Hypertensive retinopathy, bilateral: Secondary | ICD-10-CM | POA: Diagnosis not present

## 2020-01-16 DIAGNOSIS — H25812 Combined forms of age-related cataract, left eye: Secondary | ICD-10-CM | POA: Diagnosis not present

## 2020-01-16 DIAGNOSIS — H43812 Vitreous degeneration, left eye: Secondary | ICD-10-CM | POA: Diagnosis not present

## 2020-01-20 ENCOUNTER — Ambulatory Visit (INDEPENDENT_AMBULATORY_CARE_PROVIDER_SITE_OTHER): Payer: Medicare HMO | Admitting: Physician Assistant

## 2020-01-20 ENCOUNTER — Encounter (INDEPENDENT_AMBULATORY_CARE_PROVIDER_SITE_OTHER): Payer: Self-pay | Admitting: Physician Assistant

## 2020-01-20 ENCOUNTER — Other Ambulatory Visit: Payer: Self-pay

## 2020-01-20 VITALS — BP 133/85 | HR 81 | Temp 98.1°F | Ht 64.0 in | Wt 216.0 lb

## 2020-01-20 DIAGNOSIS — Z6837 Body mass index (BMI) 37.0-37.9, adult: Secondary | ICD-10-CM

## 2020-01-20 DIAGNOSIS — E1169 Type 2 diabetes mellitus with other specified complication: Secondary | ICD-10-CM

## 2020-01-20 DIAGNOSIS — E7849 Other hyperlipidemia: Secondary | ICD-10-CM

## 2020-01-21 NOTE — Progress Notes (Signed)
Chief Complaint:   Tina Sutton is here to discuss her progress with her obesity treatment plan along with follow-up of her obesity related diagnoses. Tina Sutton is on the Category 2 Plan + 100 calories and states she is following her eating plan approximately 90% of the time. 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  Today's visit was #: 4 Starting weight: 226 lbs Starting date: 12/09/2019 Today's weight: 216 lbs Today's date: 01/20/2020 Total lbs lost to date: 10 Total lbs lost since last in-office visit: 2  Interim History: Alizaya continues to do very well with weight loss. She is keeping a spreadsheet of her plan to ensure she is getting all of her food in daily. She denies excessive hunger. She is traveling to the beach for a week coming up soon.  Subjective:   Type 2 diabetes mellitus with other specified complication, without long-term current use of insulin (Tina Sutton). Tina Sutton is on no medication. She denies polyphagia.  Lab Results  Component Value Date   HGBA1C 6.5 10/31/2019   Lab Results  Component Value Date   LDLCALC 97 10/31/2019   CREATININE 0.75 12/09/2019   Lab Results  Component Value Date   INSULIN 24.9 12/09/2019   Other hyperlipidemia. Tina Sutton is on simvastatin. No chest pain or myalgias.   Lab Results  Component Value Date   CHOL 173 10/31/2019   HDL 57 10/31/2019   LDLCALC 97 10/31/2019   TRIG 107 10/31/2019   Lab Results  Component Value Date   ALT 20 12/09/2019   AST 21 12/09/2019   ALKPHOS 79 12/09/2019   BILITOT 0.5 12/09/2019   The 10-year ASCVD risk score Tina Bussing DC Jr., et al., 2013) is: 19%*   Values used to calculate the score:     Age: 69 years     Sex: Female     Is Non-Hispanic African American: No     Diabetic: Yes     Tobacco smoker: No     Systolic Blood Pressure: 706 mmHg     Is BP treated: Yes     HDL Cholesterol: 57 mg/dL*     Total Cholesterol: 173 mg/dL*     * - Cholesterol  units were assumed for this score calculation  Assessment/Plan:   Type 2 diabetes mellitus with other specified complication, without long-term current use of insulin (Tina Sutton). Good blood sugar control is important to decrease the likelihood of diabetic complications such as nephropathy, neuropathy, limb loss, blindness, coronary artery disease, and death. Intensive lifestyle modification including diet, exercise and weight loss are the first line of treatment for diabetes.   Other hyperlipidemia. Cardiovascular risk and specific lipid/LDL goals reviewed.  We discussed several lifestyle modifications today and Tina Sutton will continue to work on diet, exercise and weight loss efforts. Orders and follow up as documented in patient record. She will continue her medication as directed.   Counseling Intensive lifestyle modifications are the first line treatment for this issue. . Dietary changes: Increase soluble fiber. Decrease simple carbohydrates. . Exercise changes: Moderate to vigorous-intensity aerobic activity 150 minutes per week if tolerated. . Lipid-lowering medications: see documented in medical record.  Class 2 severe obesity with serious comorbidity and body mass index (BMI) of 37.0 to 37.9 in adult, unspecified obesity type (Tina Sutton).  Tina Sutton is currently in the action stage of change. As such, her goal is to continue with weight loss efforts. She has agreed to the Category 2 Plan + 100 calories.  Exercise goals: Tina Sutton should follow the adult guidelines. When Tina Sutton cannot meet the adult guidelines, they should be as physically active as their abilities and conditions will allow.   Behavioral modification strategies: meal planning and cooking strategies, travel eating strategies and planning for success.  Tina Sutton has agreed to follow-up with our clinic in 3 weeks. She was informed of the importance of frequent follow-up visits to maximize her success with intensive lifestyle  modifications for her multiple health conditions.   Objective:   Blood pressure 133/85, pulse 81, temperature 98.1 F (36.7 C), temperature source Oral, height 5\' 4"  (1.626 m), weight 216 lb (98 kg), last menstrual period 06/12/2000, SpO2 97 %. Body mass index is 37.08 kg/m.  General: Cooperative, alert, well developed, in no acute distress. HEENT: Conjunctivae and lids unremarkable. Cardiovascular: Regular rhythm.  Lungs: Normal work of breathing. Neurologic: No focal deficits.   Lab Results  Component Value Date   CREATININE 0.75 12/09/2019   BUN 13 12/09/2019   NA 143 12/09/2019   K 4.3 12/09/2019   CL 101 12/09/2019   CO2 25 12/09/2019   Lab Results  Component Value Date   ALT 20 12/09/2019   AST 21 12/09/2019   ALKPHOS 79 12/09/2019   BILITOT 0.5 12/09/2019   Lab Results  Component Value Date   HGBA1C 6.5 10/31/2019   Lab Results  Component Value Date   INSULIN 24.9 12/09/2019   Lab Results  Component Value Date   TSH 1.64 10/31/2019   Lab Results  Component Value Date   CHOL 173 10/31/2019   HDL 57 10/31/2019   LDLCALC 97 10/31/2019   TRIG 107 10/31/2019   No results found for: WBC, HGB, HCT, MCV, PLT No results found for: IRON, TIBC, FERRITIN  Obesity Behavioral Intervention Documentation for Insurance:   Approximately 15 minutes were spent on the discussion below.  ASK: We discussed the diagnosis of obesity with Tina Sutton today and Tina Sutton agreed to give [name] permission to discuss obesity behavioral modification therapy today.  ASSESS: Tina Sutton has the diagnosis of obesity and her BMI today is 37.2. Tina Sutton is in the action stage of change.   ADVISE: Tina Sutton was educated on the multiple health risks of obesity as well as the benefit of weight loss to improve her health. She was advised of the need for long term treatment and the importance of lifestyle modifications to improve her current health and to decrease her risk of future health  problems.  AGREE: Multiple dietary modification options and treatment options were discussed and Tina Sutton agreed to follow the recommendations documented in the above note.  ARRANGE: Tina Sutton was educated on the importance of frequent visits to treat obesity as outlined per CMS and USPSTF guidelines and agreed to schedule her next follow up appointment today.  Attestation Statements:   Reviewed by clinician on day of visit: allergies, medications, problem list, medical history, surgical history, family history, social history, and previous encounter notes.  Tina Sutton, am acting as transcriptionist for Tina Potash, PA-C   I have reviewed the above documentation for accuracy and completeness, and I agree with the above. Tina Potash, PA-C

## 2020-01-23 DIAGNOSIS — R69 Illness, unspecified: Secondary | ICD-10-CM | POA: Diagnosis not present

## 2020-02-10 ENCOUNTER — Other Ambulatory Visit: Payer: Self-pay

## 2020-02-10 ENCOUNTER — Ambulatory Visit (INDEPENDENT_AMBULATORY_CARE_PROVIDER_SITE_OTHER): Payer: Medicare HMO | Admitting: Family Medicine

## 2020-02-10 ENCOUNTER — Encounter (INDEPENDENT_AMBULATORY_CARE_PROVIDER_SITE_OTHER): Payer: Self-pay | Admitting: Family Medicine

## 2020-02-10 VITALS — BP 111/71 | HR 82 | Temp 97.9°F | Ht 64.0 in | Wt 213.0 lb

## 2020-02-10 DIAGNOSIS — Z6836 Body mass index (BMI) 36.0-36.9, adult: Secondary | ICD-10-CM

## 2020-02-10 DIAGNOSIS — E8881 Metabolic syndrome: Secondary | ICD-10-CM

## 2020-02-10 NOTE — Progress Notes (Signed)
Chief Complaint:   OBESITY Tina Sutton is here to discuss her progress with her obesity treatment plan along with follow-up of her obesity related diagnoses. Tina Sutton is on the Category 2 Plan + 100 calories and states she is following her eating plan approximately 90% of the time. Tina Sutton states she is doing 0 minutes 0 times per week.  Today's visit was #: 5 Starting weight: 226 lbs Starting date: 12/09/2019 Today's weight: 213 lbs Today's date: 02/10/2020 Total lbs lost to date: 13 Total lbs lost since last in-office visit: 3  Interim History: Tina Sutton continues to do well with weight loss on her plan. She sometimes struggles to eat all her food, especially when she is traveling. She has questions about fruit, loose skin, and cool sculpting, and when we will repeat labs.  Subjective:   1. Insulin resistance Tina Sutton is working on diet and weight loss, and she is doing very well overall. She denies hypoglycemia. I discussed labs with the patient today.  Assessment/Plan:   1. Insulin resistance Tina Sutton will continue to work on weight loss, diet, exercise, and decreasing simple carbohydrates to help decrease the risk of diabetes. We will plan to recheck labs in 4-6 weeks, and all questions were answered today. Tina Sutton agreed to follow-up with [name] as directed to closely monitor her progress.  2. Class 2 severe obesity with serious comorbidity and body mass index (BMI) of 36.0 to 36.9 in adult, unspecified obesity type (HCC) Tina Sutton is currently in the action stage of change. As such, her goal is to continue with weight loss efforts. She has agreed to the Category 2 Plan + 100 calories.   Tina Sutton was given smart fruit handout, and we discussed ways to minimize loose skin. We will repeat labs in 4-6 weeks, and try to eat all the food on her plan.  Exercise goals: Walk for 10 minutes 3 times per week.  Behavioral modification strategies: increasing lean protein intake, no skipping meals and  travel eating strategies.  Tina Sutton has agreed to follow-up with our clinic in 3 weeks. She was informed of the importance of frequent follow-up visits to maximize her success with intensive lifestyle modifications for her multiple health conditions.   Objective:   Blood pressure 111/71, pulse 82, temperature 97.9 F (36.6 C), height 5\' 4"  (1.626 m), weight 213 lb (96.6 kg), last menstrual period 06/12/2000, SpO2 97 %. Body mass index is 36.56 kg/m.  General: Cooperative, alert, well developed, in no acute distress. HEENT: Conjunctivae and lids unremarkable. Cardiovascular: Regular rhythm.  Lungs: Normal work of breathing. Neurologic: No focal deficits.   Lab Results  Component Value Date   CREATININE 0.75 12/09/2019   BUN 13 12/09/2019   NA 143 12/09/2019   K 4.3 12/09/2019   CL 101 12/09/2019   CO2 25 12/09/2019   Lab Results  Component Value Date   ALT 20 12/09/2019   AST 21 12/09/2019   ALKPHOS 79 12/09/2019   BILITOT 0.5 12/09/2019   Lab Results  Component Value Date   HGBA1C 6.5 10/31/2019   Lab Results  Component Value Date   INSULIN 24.9 12/09/2019   Lab Results  Component Value Date   TSH 1.64 10/31/2019   Lab Results  Component Value Date   CHOL 173 10/31/2019   HDL 57 10/31/2019   LDLCALC 97 10/31/2019   TRIG 107 10/31/2019   No results found for: WBC, HGB, HCT, MCV, PLT No results found for: IRON, TIBC, FERRITIN  Attestation Statements:   Reviewed  by clinician on day of visit: allergies, medications, problem list, medical history, surgical history, family history, social history, and previous encounter notes.  Time spent on visit including pre-visit chart review and post-visit care and charting was 32 minutes.    I, Trixie Dredge, am acting as transcriptionist for [name], MD.  I have reviewed the above documentation for accuracy and completeness, and I agree with the above. -  [name], MD

## 2020-02-28 DIAGNOSIS — Z20822 Contact with and (suspected) exposure to covid-19: Secondary | ICD-10-CM | POA: Diagnosis not present

## 2020-03-02 ENCOUNTER — Ambulatory Visit (INDEPENDENT_AMBULATORY_CARE_PROVIDER_SITE_OTHER): Payer: Medicare HMO | Admitting: Family Medicine

## 2020-03-03 ENCOUNTER — Ambulatory Visit (INDEPENDENT_AMBULATORY_CARE_PROVIDER_SITE_OTHER): Payer: Medicare HMO | Admitting: Family Medicine

## 2020-03-03 ENCOUNTER — Encounter (INDEPENDENT_AMBULATORY_CARE_PROVIDER_SITE_OTHER): Payer: Self-pay | Admitting: Family Medicine

## 2020-03-03 ENCOUNTER — Other Ambulatory Visit: Payer: Self-pay

## 2020-03-03 VITALS — BP 133/80 | HR 84 | Temp 97.9°F | Ht 64.0 in | Wt 207.0 lb

## 2020-03-03 DIAGNOSIS — I10 Essential (primary) hypertension: Secondary | ICD-10-CM | POA: Diagnosis not present

## 2020-03-04 NOTE — Progress Notes (Signed)
Chief Complaint:   OBESITY Tina Sutton is here to discuss her progress with her obesity treatment plan along with follow-up of her obesity related diagnoses. Tina Sutton is on the Category 2 Plan + 100 calories and states she is following her eating plan approximately 90% of the time. Tina Sutton states she is doing 0 minutes 0 times per week.  Today's visit was #: 6 Starting weight: 226 lbs Starting date: 12/09/2019 Today's weight: 207 lbs Today's date: 03/03/2020 Total lbs lost to date: 19 Total lbs lost since last in-office visit: 6  Interim History: Tina Sutton continues to do well with weight loss even with some celebration eating. She is doing well with portion control when she indulges. She is doing well meeting her protein goals.  Subjective:   1. Essential hypertension Tina Sutton's blood pressure is stable on her medications, and she denies signs of hypotension. She denies chest pain.  Assessment/Plan:   1. Essential hypertension Tina Sutton will continue working on healthy weight loss, diet, and exercise to improve blood pressure control. We will watch for signs of hypotension as she continues her lifestyle modifications. We will plan to recheck labs at her next visit.  2. Class 2 severe obesity with serious comorbidity and body mass index (BMI) of 35.0 to 35.9 in adult, unspecified obesity type (HCC) Tina Sutton is currently in the action stage of change. As such, her goal is to continue with weight loss efforts. She has agreed to the Category 2 Plan + 100 calories.   Behavioral modification strategies: meal planning and cooking strategies and dealing with family or coworker sabotage.  Tina Sutton has agreed to follow-up with our clinic in 3 weeks. She was informed of the importance of frequent follow-up visits to maximize her success with intensive lifestyle modifications for her multiple health conditions.   Objective:   Blood pressure 133/80, pulse 84, temperature 97.9 F (36.6 C),  temperature source Oral, height 5\' 4"  (1.626 m), weight 207 lb (93.9 kg), last menstrual period 06/12/2000, SpO2 96 %. Body mass index is 35.53 kg/m.  General: Cooperative, alert, well developed, in no acute distress. HEENT: Conjunctivae and lids unremarkable. Cardiovascular: Regular rhythm.  Lungs: Normal work of breathing. Neurologic: No focal deficits.   Lab Results  Component Value Date   CREATININE 0.75 12/09/2019   BUN 13 12/09/2019   NA 143 12/09/2019   K 4.3 12/09/2019   CL 101 12/09/2019   CO2 25 12/09/2019   Lab Results  Component Value Date   ALT 20 12/09/2019   AST 21 12/09/2019   ALKPHOS 79 12/09/2019   BILITOT 0.5 12/09/2019   Lab Results  Component Value Date   HGBA1C 6.5 10/31/2019   Lab Results  Component Value Date   INSULIN 24.9 12/09/2019   Lab Results  Component Value Date   TSH 1.64 10/31/2019   Lab Results  Component Value Date   CHOL 173 10/31/2019   HDL 57 10/31/2019   LDLCALC 97 10/31/2019   TRIG 107 10/31/2019   No results found for: WBC, HGB, HCT, MCV, PLT No results found for: IRON, TIBC, FERRITIN  Attestation Statements:   Reviewed by clinician on day of visit: allergies, medications, problem list, medical history, surgical history, family history, social history, and previous encounter notes.  Time spent on visit including pre-visit chart review and post-visit care and charting was 20 minutes.    I, Trixie Dredge, am acting as transcriptionist for [name], MD.  I have reviewed the above documentation for accuracy and completeness, and  I agree with the above. -  [name], MD

## 2020-03-08 DIAGNOSIS — R69 Illness, unspecified: Secondary | ICD-10-CM | POA: Diagnosis not present

## 2020-03-30 ENCOUNTER — Other Ambulatory Visit: Payer: Self-pay

## 2020-03-30 ENCOUNTER — Ambulatory Visit (INDEPENDENT_AMBULATORY_CARE_PROVIDER_SITE_OTHER): Payer: Medicare HMO | Admitting: Family Medicine

## 2020-03-30 ENCOUNTER — Encounter (INDEPENDENT_AMBULATORY_CARE_PROVIDER_SITE_OTHER): Payer: Self-pay | Admitting: Family Medicine

## 2020-03-30 VITALS — BP 117/71 | HR 78 | Temp 97.7°F | Ht 64.0 in | Wt 202.0 lb

## 2020-03-30 DIAGNOSIS — E7849 Other hyperlipidemia: Secondary | ICD-10-CM

## 2020-03-30 DIAGNOSIS — E559 Vitamin D deficiency, unspecified: Secondary | ICD-10-CM

## 2020-03-30 DIAGNOSIS — E669 Obesity, unspecified: Secondary | ICD-10-CM

## 2020-03-30 DIAGNOSIS — Z6834 Body mass index (BMI) 34.0-34.9, adult: Secondary | ICD-10-CM | POA: Diagnosis not present

## 2020-03-30 DIAGNOSIS — E1169 Type 2 diabetes mellitus with other specified complication: Secondary | ICD-10-CM | POA: Diagnosis not present

## 2020-03-30 DIAGNOSIS — I10 Essential (primary) hypertension: Secondary | ICD-10-CM | POA: Diagnosis not present

## 2020-03-31 LAB — COMPREHENSIVE METABOLIC PANEL
ALT: 33 IU/L — ABNORMAL HIGH (ref 0–32)
AST: 30 IU/L (ref 0–40)
Albumin/Globulin Ratio: 2 (ref 1.2–2.2)
Albumin: 4.7 g/dL (ref 3.8–4.8)
Alkaline Phosphatase: 75 IU/L (ref 44–121)
BUN/Creatinine Ratio: 22 (ref 12–28)
BUN: 17 mg/dL (ref 8–27)
Bilirubin Total: 0.6 mg/dL (ref 0.0–1.2)
CO2: 26 mmol/L (ref 20–29)
Calcium: 9.9 mg/dL (ref 8.7–10.3)
Chloride: 100 mmol/L (ref 96–106)
Creatinine, Ser: 0.77 mg/dL (ref 0.57–1.00)
GFR calc Af Amer: 92 mL/min/{1.73_m2} (ref >59)
GFR calc non Af Amer: 80 mL/min/{1.73_m2} (ref >59)
Globulin, Total: 2.4 g/dL (ref 1.5–4.5)
Glucose: 101 mg/dL — ABNORMAL HIGH (ref 65–99)
Potassium: 4.5 mmol/L (ref 3.5–5.2)
Sodium: 138 mmol/L (ref 134–144)
Total Protein: 7.1 g/dL (ref 6.0–8.5)

## 2020-03-31 LAB — LIPID PANEL WITH LDL/HDL RATIO
Cholesterol, Total: 166 mg/dL (ref 100–199)
HDL: 52 mg/dL (ref >39)
LDL Chol Calc (NIH): 98 mg/dL (ref 0–99)
LDL/HDL Ratio: 1.9 ratio (ref 0.0–3.2)
Triglycerides: 88 mg/dL (ref 0–149)
VLDL Cholesterol Cal: 16 mg/dL (ref 5–40)

## 2020-03-31 LAB — INSULIN, RANDOM: INSULIN: 13.9 u[IU]/mL (ref 2.6–24.9)

## 2020-03-31 LAB — HEMOGLOBIN A1C
Est. average glucose Bld gHb Est-mCnc: 120 mg/dL
Hgb A1c MFr Bld: 5.8 % — ABNORMAL HIGH (ref 4.8–5.6)

## 2020-03-31 LAB — VITAMIN D 25 HYDROXY (VIT D DEFICIENCY, FRACTURES): Vit D, 25-Hydroxy: 64.4 ng/mL (ref 30.0–100.0)

## 2020-04-06 NOTE — Progress Notes (Signed)
Chief Complaint:   OBESITY Tina Sutton is here to discuss her progress with her obesity treatment plan along with follow-up of her obesity related diagnoses. Tina Sutton is on the Category 2 Plan + 100 calories and states she is following her eating plan approximately 90% of the time. Tina Sutton states she is walking once weekly.   Today's visit was #: 7 Starting weight: 226 lbs Starting date: 12/06/2019 Today's weight: 202 lbs Today's date: 03/30/2020 Total lbs lost to date: 24 Total lbs lost since last in-office visit: 5  Interim History: Tina Sutton continues to do well with weight loss on her plan. Her hunger is controlled and she is feeling well. She has questions about adequate protein intake and renal function.  Subjective:   1. Type 2 diabetes mellitus with other specified complication, without long-term current use of insulin (HCC) Tina Sutton is doing well with diet and medications, and she is due for labs. She denies hypoglycemia.  2. Essential hypertension Tina Sutton's blood pressure is well controlled, and she denies signs of hypotension. She is due for labs.  3. Other hyperlipidemia Tina Sutton is stable on statin, and she is doing well with diet. She denies chest pain. She is due for labs.  4. Vitamin D deficiency Tina Sutton is stable on Vit D, but her level is not yet at goal. She denies nausea or vomiting. She is due for labs.  Assessment/Plan:   1. Type 2 diabetes mellitus with other specified complication, without long-term current use of insulin (HCC) Good blood sugar control is important to decrease the likelihood of diabetic complications such as nephropathy, neuropathy, limb loss, blindness, coronary artery disease, and death. Intensive lifestyle modification including diet, exercise and weight loss are the first line of treatment for diabetes. We will check labs today, and Angeletta will follow up as directed.  - Insulin, random - Hemoglobin A1c  2. Essential  hypertension Taiwana is working on healthy weight loss and exercise to improve blood pressure control. We will watch for signs of hypotension as she continues her lifestyle modifications. We will check labs today.  - Comprehensive metabolic panel  3. Other hyperlipidemia Cardiovascular risk and specific lipid/LDL goals reviewed. We discussed several lifestyle modifications today. Cathalina will continue to work on diet, exercise and weight loss efforts. We will check labs today. Orders and follow up as documented in patient record.   Counseling Intensive lifestyle modifications are the first line treatment for this issue. . Dietary changes: Increase soluble fiber. Decrease simple carbohydrates. . Exercise changes: Moderate to vigorous-intensity aerobic activity 150 minutes per week if tolerated. . Lipid-lowering medications: see documented in medical record.  - Lipid Panel With LDL/HDL Ratio  4. Vitamin D deficiency Low Vitamin D level contributes to fatigue and are associated with obesity, breast, and colon cancer. Tina Sutton agreed to continue taking prescription Vitamin D as is, and we will check labs today. She will follow-up for routine testing of Vitamin D, at least 2-3 times per year to avoid over-replacement.  - VITAMIN D 25 Hydroxy (Vit-D Deficiency, Fractures)  5. Class 1 obesity with serious comorbidity and body mass index (BMI) of 34.0 to 34.9 in adult, unspecified obesity type Tina Sutton is currently in the action stage of change. As such, her goal is to continue with weight loss efforts. She has agreed to the Category 2 Plan + 100 calories.   Tina Sutton's renal function is excellent and she was advised that she is on an adequate protein diet, but not high protein diet and should have  no problems with renal function especially as she gets healthier.  Exercise goals: As is.  Behavioral modification strategies: meal planning and cooking strategies and holiday eating strategies  .  Tina Sutton has agreed to follow-up with our clinic in 3 to 4 weeks. She was informed of the importance of frequent follow-up visits to maximize her success with intensive lifestyle modifications for her multiple health conditions.   Tina Sutton was informed we would discuss her lab results at her next visit unless there is a critical issue that needs to be addressed sooner. Tina Sutton agreed to keep her next visit at the agreed upon time to discuss these results.  Objective:   Blood pressure 117/71, pulse 78, temperature 97.7 F (36.5 C), height 5\' 4"  (1.626 m), weight 202 lb (91.6 kg), last menstrual period 06/12/2000, SpO2 97 %. Body mass index is 34.67 kg/m.  General: Cooperative, alert, well developed, in no acute distress. HEENT: Conjunctivae and lids unremarkable. Cardiovascular: Regular rhythm.  Lungs: Normal work of breathing. Neurologic: No focal deficits.   Lab Results  Component Value Date   CREATININE 0.77 03/30/2020   BUN 17 03/30/2020   NA 138 03/30/2020   K 4.5 03/30/2020   CL 100 03/30/2020   CO2 26 03/30/2020   Lab Results  Component Value Date   ALT 33 (H) 03/30/2020   AST 30 03/30/2020   ALKPHOS 75 03/30/2020   BILITOT 0.6 03/30/2020   Lab Results  Component Value Date   HGBA1C 5.8 (H) 03/30/2020   HGBA1C 6.5 10/31/2019   Lab Results  Component Value Date   INSULIN 13.9 03/30/2020   INSULIN 24.9 12/09/2019   Lab Results  Component Value Date   TSH 1.64 10/31/2019   Lab Results  Component Value Date   CHOL 166 03/30/2020   HDL 52 03/30/2020   LDLCALC 98 03/30/2020   TRIG 88 03/30/2020   No results found for: WBC, HGB, HCT, MCV, PLT No results found for: IRON, TIBC, FERRITIN  Obesity Behavioral Intervention:   Approximately 15 minutes were spent on the discussion below.  ASK: We discussed the diagnosis of obesity with [place] today and Breindel agreed to give [name] permission to discuss obesity behavioral modification therapy  today.  ASSESS: Tina Sutton has the diagnosis of obesity and her BMI today is 34.66. Tina Sutton is in the action stage of change.   ADVISE: Tina Sutton was educated on the multiple health risks of obesity as well as the benefit of weight loss to improve her health. She was advised of the need for long term treatment and the importance of lifestyle modifications to improve her current health and to decrease her risk of future health problems.  AGREE: Multiple dietary modification options and treatment options were discussed and Tina Sutton agreed to follow the recommendations documented in the above note.  ARRANGE: Tina Sutton was educated on the importance of frequent visits to treat obesity as outlined per CMS and USPSTF guidelines and agreed to schedule her next follow up appointment today.  Attestation Statements:   Reviewed by clinician on day of visit: allergies, medications, problem list, medical history, surgical history, family history, social history, and previous encounter notes.   I, Trixie Dredge, am acting as transcriptionist for [name], MD.  I have reviewed the above documentation for accuracy and completeness, and I agree with the above. -  [name], MD

## 2020-04-27 ENCOUNTER — Ambulatory Visit (INDEPENDENT_AMBULATORY_CARE_PROVIDER_SITE_OTHER): Payer: Medicare HMO | Admitting: Family Medicine

## 2020-04-27 ENCOUNTER — Encounter (INDEPENDENT_AMBULATORY_CARE_PROVIDER_SITE_OTHER): Payer: Self-pay | Admitting: Family Medicine

## 2020-04-27 ENCOUNTER — Other Ambulatory Visit: Payer: Self-pay

## 2020-04-27 VITALS — BP 134/83 | HR 87 | Temp 97.8°F | Ht 64.0 in | Wt 198.0 lb

## 2020-04-27 DIAGNOSIS — E1169 Type 2 diabetes mellitus with other specified complication: Secondary | ICD-10-CM | POA: Diagnosis not present

## 2020-04-27 DIAGNOSIS — I10 Essential (primary) hypertension: Secondary | ICD-10-CM | POA: Diagnosis not present

## 2020-04-27 DIAGNOSIS — Z6834 Body mass index (BMI) 34.0-34.9, adult: Secondary | ICD-10-CM

## 2020-04-27 DIAGNOSIS — R7989 Other specified abnormal findings of blood chemistry: Secondary | ICD-10-CM | POA: Diagnosis not present

## 2020-04-27 DIAGNOSIS — E669 Obesity, unspecified: Secondary | ICD-10-CM | POA: Diagnosis not present

## 2020-04-28 NOTE — Progress Notes (Signed)
Chief Complaint:   OBESITY Tina Sutton is here to discuss her progress with her obesity treatment plan along with follow-up of her obesity related diagnoses. Tina Sutton is on the Category 2 Plan + 100 calories and states she is following her eating plan approximately 95% of the time. Tina Sutton states she is doing 0 minutes 0 times per week.  Today's visit was #: 8 Starting weight: 226 lbs Starting date: 12/06/2019 Today's weight: 198 lbs Today's date: 04/27/2020 Total lbs lost to date: 28 Total lbs lost since last in-office visit: 4  Interim History: Tina Sutton continues to do very well with weight loss on her eating plan. She has questions about aspartame and metamucil, and how these affect her weight.   Subjective:   1. Type 2 diabetes mellitus with other specified complication, without long-term current use of insulin (HCC) Tina Sutton's A1c is improving from 6.5 to 5.8 with diet, exercise, and weight loss. She is not on medications, and her fasting insulin has also improved. I discussed labs with the patient today.  2. Elevated LFTs Tina Sutton is on simvastatin and she is doing well with weight loss but her ALT is slightly elevated. She denies abdominal pain or jaundice. I discussed labs with the patient today.  3. Essential hypertension Tina Sutton's blood pressure is well controlled on medications and with weight loss. She denies signs of hypotension.  Assessment/Plan:   1. Type 2 diabetes mellitus with other specified complication, without long-term current use of insulin (HCC) Good blood sugar control is important to decrease the likelihood of diabetic complications such as nephropathy, neuropathy, limb loss, blindness, coronary artery disease, and death. Intensive lifestyle modification including diet, exercise and weight loss are the first line of treatment for diabetes. Tina Sutton will continue diet and exercise, and will continue to monitor. 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  2. Elevated LFTs We discussed the likely diagnosis of non-alcoholic fatty liver disease today and how this condition is obesity related. Tina Sutton was educated on NAFLD and status, and was reassured that this is unlikely to be a problem, but we will plan to recheck labs in 2 months to reassess. Tina Sutton agreed to continue with her weight loss efforts with healthier diet and exercise as an essential part of her treatment plan.  3. Essential hypertension Tina Sutton will continue working on healthy weight loss, diet, and exercise to improve blood pressure control. We will watch for signs of hypotension as she continues her lifestyle modifications.  4. Class 1 obesity with serious comorbidity and body mass index (BMI) of 34.0 to 34.9 in adult, unspecified obesity type Tina Sutton is currently in the action stage of change. As such, her goal is to continue with weight loss efforts. She has agreed to the Category 2 Plan + 100 calories.   Tina Sutton was educated on non-nutritive sweeteners and fiber supplements today.  Behavioral modification strategies: meal planning and cooking strategies and holiday eating strategies .  Tina Sutton has agreed to follow-up with our clinic in 8 to 9 weeks. She was informed of the importance of frequent follow-up visits to maximize her success with intensive lifestyle modifications for her multiple health conditions.   Objective:   Blood pressure 134/83, pulse 87, temperature 97.8 F (36.6 C), height 5\' 4"  (1.626 m), weight 198 lb (89.8 kg), last menstrual period 06/12/2000, SpO2 97 %. Body mass index is 33.99 kg/m.  General: Cooperative, alert, well developed, in no acute distress. HEENT: Conjunctivae and lids unremarkable. Cardiovascular: Regular rhythm.  Lungs: Normal  work of breathing. Neurologic: No focal deficits.   Lab Results  Component Value Date   CREATININE 0.77 03/30/2020   BUN 17 03/30/2020   NA 138 03/30/2020   K 4.5 03/30/2020   CL 100 03/30/2020    CO2 26 03/30/2020   Lab Results  Component Value Date   ALT 33 (H) 03/30/2020   AST 30 03/30/2020   ALKPHOS 75 03/30/2020   BILITOT 0.6 03/30/2020   Lab Results  Component Value Date   HGBA1C 5.8 (H) 03/30/2020   HGBA1C 6.5 10/31/2019   Lab Results  Component Value Date   INSULIN 13.9 03/30/2020   INSULIN 24.9 12/09/2019   Lab Results  Component Value Date   TSH 1.64 10/31/2019   Lab Results  Component Value Date   CHOL 166 03/30/2020   HDL 52 03/30/2020   LDLCALC 98 03/30/2020   TRIG 88 03/30/2020   No results found for: WBC, HGB, HCT, MCV, PLT No results found for: IRON, TIBC, FERRITIN  Attestation Statements:   Reviewed by clinician on day of visit: allergies, medications, problem list, medical history, surgical history, family history, social history, and previous encounter notes.  Time spent on visit including pre-visit chart review and post-visit care and charting was 32 minutes.    I, Trixie Dredge, am acting as transcriptionist for [name], MD.  I have reviewed the above documentation for accuracy and completeness, and I agree with the above. -  [name], MD

## 2020-06-17 ENCOUNTER — Other Ambulatory Visit: Payer: Self-pay | Admitting: Obstetrics and Gynecology

## 2020-06-17 DIAGNOSIS — Z1231 Encounter for screening mammogram for malignant neoplasm of breast: Secondary | ICD-10-CM

## 2020-06-22 ENCOUNTER — Ambulatory Visit (INDEPENDENT_AMBULATORY_CARE_PROVIDER_SITE_OTHER): Payer: Medicare HMO | Admitting: Family Medicine

## 2020-06-22 ENCOUNTER — Other Ambulatory Visit: Payer: Self-pay

## 2020-06-22 ENCOUNTER — Encounter (INDEPENDENT_AMBULATORY_CARE_PROVIDER_SITE_OTHER): Payer: Self-pay | Admitting: Family Medicine

## 2020-06-22 VITALS — BP 130/82 | HR 88 | Temp 98.3°F | Ht 64.0 in | Wt 193.0 lb

## 2020-06-22 DIAGNOSIS — R232 Flushing: Secondary | ICD-10-CM

## 2020-06-22 DIAGNOSIS — R5383 Other fatigue: Secondary | ICD-10-CM | POA: Diagnosis not present

## 2020-06-22 DIAGNOSIS — R7989 Other specified abnormal findings of blood chemistry: Secondary | ICD-10-CM

## 2020-06-22 DIAGNOSIS — E7849 Other hyperlipidemia: Secondary | ICD-10-CM | POA: Diagnosis not present

## 2020-06-22 DIAGNOSIS — E669 Obesity, unspecified: Secondary | ICD-10-CM

## 2020-06-22 DIAGNOSIS — E1169 Type 2 diabetes mellitus with other specified complication: Secondary | ICD-10-CM | POA: Diagnosis not present

## 2020-06-22 DIAGNOSIS — E559 Vitamin D deficiency, unspecified: Secondary | ICD-10-CM | POA: Diagnosis not present

## 2020-06-22 DIAGNOSIS — Z6833 Body mass index (BMI) 33.0-33.9, adult: Secondary | ICD-10-CM

## 2020-06-23 LAB — COMPREHENSIVE METABOLIC PANEL
ALT: 21 IU/L (ref 0–32)
AST: 19 IU/L (ref 0–40)
Albumin/Globulin Ratio: 1.9 (ref 1.2–2.2)
Albumin: 4.5 g/dL (ref 3.8–4.8)
Alkaline Phosphatase: 61 IU/L (ref 44–121)
BUN/Creatinine Ratio: 28 (ref 12–28)
BUN: 20 mg/dL (ref 8–27)
Bilirubin Total: 0.4 mg/dL (ref 0.0–1.2)
CO2: 23 mmol/L (ref 20–29)
Calcium: 9.8 mg/dL (ref 8.7–10.3)
Chloride: 102 mmol/L (ref 96–106)
Creatinine, Ser: 0.72 mg/dL (ref 0.57–1.00)
GFR calc Af Amer: 100 mL/min/{1.73_m2} (ref >59)
GFR calc non Af Amer: 86 mL/min/{1.73_m2} (ref >59)
Globulin, Total: 2.4 g/dL (ref 1.5–4.5)
Glucose: 98 mg/dL (ref 65–99)
Potassium: 4.2 mmol/L (ref 3.5–5.2)
Sodium: 140 mmol/L (ref 134–144)
Total Protein: 6.9 g/dL (ref 6.0–8.5)

## 2020-06-23 LAB — HEMOGLOBIN A1C
Est. average glucose Bld gHb Est-mCnc: 114 mg/dL
Hgb A1c MFr Bld: 5.6 % (ref 4.8–5.6)

## 2020-06-23 LAB — INSULIN, RANDOM: INSULIN: 17.6 u[IU]/mL (ref 2.6–24.9)

## 2020-06-23 LAB — T3: T3, Total: 103 ng/dL (ref 71–180)

## 2020-06-23 LAB — LIPID PANEL WITH LDL/HDL RATIO
Cholesterol, Total: 194 mg/dL (ref 100–199)
HDL: 64 mg/dL (ref >39)
LDL Chol Calc (NIH): 113 mg/dL — ABNORMAL HIGH (ref 0–99)
LDL/HDL Ratio: 1.8 ratio (ref 0.0–3.2)
Triglycerides: 94 mg/dL (ref 0–149)
VLDL Cholesterol Cal: 17 mg/dL (ref 5–40)

## 2020-06-23 LAB — T4, FREE: Free T4: 1.28 ng/dL (ref 0.82–1.77)

## 2020-06-23 LAB — TSH: TSH: 2.26 u[IU]/mL (ref 0.450–4.500)

## 2020-06-23 LAB — VITAMIN D 25 HYDROXY (VIT D DEFICIENCY, FRACTURES): Vit D, 25-Hydroxy: 52.6 ng/mL (ref 30.0–100.0)

## 2020-06-23 NOTE — Progress Notes (Signed)
Chief Complaint:   OBESITY Lorre is here to discuss her progress with her obesity treatment plan along with follow-up of her obesity related diagnoses. Mallori is on the Category 2 Plan + 100 calories and states she is following her eating plan approximately 90% of the time. Eilish states she is doing 0 minutes 0 times per week.  Today's visit was #: 9 Starting weight: 226 lbs Starting date: 12/06/2019 Today's weight: 193 lbs Today's date: 06/22/2020 Total lbs lost to date: 33 Total lbs lost since last in-office visit: 5  Interim History: Drishti continues to do very well with weight loss even over the holidays. Her hunger is mostly controlled and she is working on meal planning and preparation.  Subjective:   1. Hot flashes Carmell notes multiple hot flashes per day and most nights, and worse in the last couple of months. She denies fever or chills, cough or TB exposure. She is status post total hysterectomy approximately 20 years ago, and she has been off hormones for 5 years.  2. Type 2 diabetes mellitus with other specified complication, without long-term current use of insulin (HCC) Takeisha's A1c has improved with diet and weight loss. She continues to do well and she is due to have labs rechecked.  3. Other hyperlipidemia Ardath is working on diet and weight loss. She denies chest pain, and she is due for labs.  4. Vitamin D deficiency Josie is on Vit D, and she is due to have labs checked.  5. Elevated LFTs Chontel is working on diet and weight loss. She denies abdominal pain or jaundice.  6. Fatigue, unspecified type Envy notes her fatigue has improved. Last TSH was within normal limits, but she has heat intolerance now.  Assessment/Plan:   1. Hot flashes We will check labs labs for thyroid, and Selby is to schedule an appointment with her GYN for further evaluation.  2. Type 2 diabetes mellitus with other specified complication, without long-term  current use of insulin (HCC) Good blood sugar control is important to decrease the likelihood of diabetic complications such as nephropathy, neuropathy, limb loss, blindness, coronary artery disease, and death. Intensive lifestyle modification including diet, exercise and weight loss are the first line of treatment for diabetes. We will check labs today. Windsor will continue to follow up as directed.  - Comprehensive metabolic panel - Insulin, random - Hemoglobin A1c  3. Other hyperlipidemia Cardiovascular risk and specific lipid/LDL goals reviewed. We discussed several lifestyle modifications today. Amellia will continue to work on diet, exercise and weight loss efforts. We will check labs today. Orders and follow up as documented in patient record.   - Lipid Panel With LDL/HDL Ratio  4. Vitamin D deficiency Low Vitamin D level contributes to fatigue and are associated with obesity, breast, and colon cancer. We will check labs today. Genita will follow-up for routine testing of Vitamin D, at least 2-3 times per year to avoid over-replacement.  - VITAMIN D 25 Hydroxy (Vit-D Deficiency, Fractures)  5. Elevated LFTs We discussed the likely diagnosis of non-alcoholic fatty liver disease today and how this condition is obesity related. Tashara was educated the importance of weight loss. We will check labs today. Lesha agreed to continue with her weight loss efforts with healthier diet and exercise as an essential part of her treatment plan.  - Comprehensive metabolic panel  6. Fatigue, unspecified type We will check labs today. Arianni will continue to follow up as directed.  - TSH - T4, free -  T3  7. Class 1 obesity with serious comorbidity and body mass index (BMI) of 33.0 to 33.9 in adult, unspecified obesity type Alyssandra is currently in the action stage of change. As such, her goal is to continue with weight loss efforts. She has agreed to the Category 2 Plan + 100 calories.    Behavioral modification strategies: meal planning and cooking strategies.  Megahn has agreed to follow-up with our clinic in 3 to 4 weeks. She was informed of the importance of frequent follow-up visits to maximize her success with intensive lifestyle modifications for her multiple health conditions.   Icy was informed we would discuss her lab results at her next visit unless there is a critical issue that needs to be addressed sooner. Anyssa agreed to keep her next visit at the agreed upon time to discuss these results.  Objective:   Blood pressure 130/82, pulse 88, temperature 98.3 F (36.8 C), temperature source Oral, height 5\' 4"  (1.626 m), weight 193 lb (87.5 kg), last menstrual period 06/12/2000, SpO2 98 %. Body mass index is 33.13 kg/m.  General: Cooperative, alert, well developed, in no acute distress. HEENT: Conjunctivae and lids unremarkable. Cardiovascular: Regular rhythm.  Lungs: Normal work of breathing. Neurologic: No focal deficits.   Lab Results  Component Value Date   CREATININE 0.72 06/22/2020   BUN 20 06/22/2020   NA 140 06/22/2020   K 4.2 06/22/2020   CL 102 06/22/2020   CO2 23 06/22/2020   Lab Results  Component Value Date   ALT 21 06/22/2020   AST 19 06/22/2020   ALKPHOS 61 06/22/2020   BILITOT 0.4 06/22/2020   Lab Results  Component Value Date   HGBA1C 5.6 06/22/2020   HGBA1C 5.8 (H) 03/30/2020   HGBA1C 6.5 10/31/2019   Lab Results  Component Value Date   INSULIN 17.6 06/22/2020   INSULIN 13.9 03/30/2020   INSULIN 24.9 12/09/2019   Lab Results  Component Value Date   TSH 2.260 06/22/2020   Lab Results  Component Value Date   CHOL 194 06/22/2020   HDL 64 06/22/2020   LDLCALC 113 (H) 06/22/2020   TRIG 94 06/22/2020   No results found for: WBC, HGB, HCT, MCV, PLT No results found for: IRON, TIBC, FERRITIN  Obesity Behavioral Intervention:   Approximately 15 minutes were spent on the discussion below.  ASK: We discussed  the diagnosis of obesity with Sarita today and Ravneet agreed to give [name] permission to discuss obesity behavioral modification therapy today.  ASSESS: Laelia has the diagnosis of obesity and her BMI today is 33.11. Vola is in the action stage of change.   ADVISE: Carnita was educated on the multiple health risks of obesity as well as the benefit of weight loss to improve her health. She was advised of the need for long term treatment and the importance of lifestyle modifications to improve her current health and to decrease her risk of future health problems.  AGREE: Multiple dietary modification options and treatment options were discussed and Robecca agreed to follow the recommendations documented in the above note.  ARRANGE: Tahlor was educated on the importance of frequent visits to treat obesity as outlined per CMS and USPSTF guidelines and agreed to schedule her next follow up appointment today.  Attestation Statements:   Reviewed by clinician on day of visit: allergies, medications, problem list, medical history, surgical history, family history, social history, and previous encounter notes.   Wilhemena Durie, am acting as transcriptionist for [name], MD.  I have reviewed the above documentation for accuracy and completeness, and I agree with the above. -  [name], MD

## 2020-06-29 ENCOUNTER — Encounter: Payer: Self-pay | Admitting: Obstetrics and Gynecology

## 2020-06-29 ENCOUNTER — Other Ambulatory Visit: Payer: Self-pay

## 2020-06-29 ENCOUNTER — Ambulatory Visit: Payer: Medicare HMO | Admitting: Obstetrics and Gynecology

## 2020-06-29 VITALS — BP 140/70 | HR 90 | Resp 14 | Ht 64.0 in | Wt 195.2 lb

## 2020-06-29 DIAGNOSIS — R634 Abnormal weight loss: Secondary | ICD-10-CM | POA: Diagnosis not present

## 2020-06-29 DIAGNOSIS — R232 Flushing: Secondary | ICD-10-CM | POA: Diagnosis not present

## 2020-06-29 DIAGNOSIS — R61 Generalized hyperhidrosis: Secondary | ICD-10-CM

## 2020-06-29 LAB — CBC WITH DIFFERENTIAL/PLATELET
Absolute Monocytes: 749 cells/uL (ref 200–950)
Basophils Absolute: 35 cells/uL (ref 0–200)
Basophils Relative: 0.3 %
Eosinophils Absolute: 105 cells/uL (ref 15–500)
Eosinophils Relative: 0.9 %
HCT: 41.3 % (ref 35.0–45.0)
Hemoglobin: 14 g/dL (ref 11.7–15.5)
Lymphs Abs: 2761 cells/uL (ref 850–3900)
MCH: 30.5 pg (ref 27.0–33.0)
MCHC: 33.9 g/dL (ref 32.0–36.0)
MCV: 90 fL (ref 80.0–100.0)
MPV: 13.7 fL — ABNORMAL HIGH (ref 7.5–12.5)
Monocytes Relative: 6.4 %
Neutro Abs: 8050 cells/uL — ABNORMAL HIGH (ref 1500–7800)
Neutrophils Relative %: 68.8 %
Platelets: 199 10*3/uL (ref 140–400)
RBC: 4.59 10*6/uL (ref 3.80–5.10)
RDW: 11.9 % (ref 11.0–15.0)
Total Lymphocyte: 23.6 %
WBC: 11.7 10*3/uL — ABNORMAL HIGH (ref 3.8–10.8)

## 2020-06-29 NOTE — Patient Instructions (Signed)
You can try estroven or estroven pm for vasomotor symptoms.   Menopause and Hormone Replacement Therapy Menopause is a normal time of life when menstrual periods stop completely and the ovaries stop producing the female hormones estrogen and progesterone. Low levels of these hormones can affect your health and cause symptoms. Hormone replacement therapy (HRT) can relieve some of those symptoms. HRT is the use of artificial (synthetic) hormones to replace hormones that your body has stopped producing because you have reached menopause. Types of HRT HRT may consist of the synthetic hormones estrogen and progestin, or it may consist of estrogen-only therapy. You and your health care provider will decide which form of HRT is best for you. If you choose to be on HRT and you have a uterus, estrogen and progestin are usually prescribed. Estrogen-only therapy is used for women who do not have a uterus. Possible options for taking HRT include:  Pills.  Patches.  Gels.  Sprays.  Vaginal cream.  Vaginal rings.  Vaginal inserts. The amount of hormones that you take and how long you take them varies according to your health. It is important to:  Begin HRT with the lowest possible dosage.  Stop HRT as soon as your health care provider tells you to stop.  Work with your health care provider so that you feel informed and comfortable with your decisions.   Tell a health care provider about:  Any allergies you have.  Whether you have had blood clots or know of any risk factors you may have for blood clots.  Whether you or family members have had cancer, especially cancer of the breasts, ovaries, or uterus.  Any surgeries you have had.  All medicines you are taking, including vitamins, herbs, eye drops, creams, and over-the-counter medicines.  Whether you are pregnant or may be pregnant.  Any medical conditions you have. What are the benefits? HRT can reduce the frequency and severity of  menopausal symptoms. Benefits of HRT vary according to the kind of symptoms that you have, how severe they are, and your overall health. HRT may help to improve the following symptoms of menopause:  Hot flashes and night sweats. These are sudden feelings of heat that spread over the face and body. The skin may turn red, like a blush. Night sweats are hot flashes that happen while you are sleeping or trying to sleep.  Bone loss (osteoporosis). The body loses calcium more quickly after menopause, causing the bones to become weaker. This can increase the risk for bone breaks (fractures).  Vaginal dryness. The lining of the vagina can become thin and dry, which can cause pain during sex or cause infection, burning, or itching.  Urinary tract infections.  Urinary incontinence. This is the inability to control when you urinate.  Irritability.  Short-term memory problems. What are the risks? Risks of HRT vary depending on your individual health and medical history. Risks of HRT also depend on whether you receive both estrogen and progestin or you receive estrogen only. HRT may increase the risk of:  Spotting. This is when a small amount of blood leaks from the vagina unexpectedly.  Endometrial cancer. This cancer is in the lining of the uterus (endometrium).  Breast cancer.  Increased density of breast tissue. This can make it harder to find breast cancer on a breast X-ray (mammogram).  Stroke.  Heart disease.  Blood clots.  Gallbladder disease or liver disease. Risks of HRT can increase if you have any of the following conditions:  Endometrial cancer.  Liver disease.  Heart disease.  Breast cancer.  History of blood clots.  History of stroke. Follow these instructions at home: Pap tests  Have Pap tests done as often as told by your health care provider. A Pap test is sometimes called a Pap smear. It is a screening test that is used to check for signs of cancer of the cervix  and vagina. A Pap test can also identify the presence of infection or precancerous changes. Pap tests may be done: ? Every 3 years, starting at age 20. ? Every 5 years, starting after age 61, in combination with testing for human papillomavirus (HPV). ? More often or less often depending on other medical conditions you have, your age, and other risk factors.  It is up to you to get the results of your Pap test. Ask your health care provider, or the department that is doing the test, when your results will be ready. General instructions  Take over-the-counter and prescription medicines only as told by your health care provider.  Do not use any products that contain nicotine or tobacco. These products include cigarettes, chewing tobacco, and vaping devices, such as e-cigarettes. If you need help quitting, ask your health care provider.  Get mammograms, pelvic exams, and medical checkups as often as told by your health care provider.  Keep all follow-up visits. This is important. Contact a health care provider if you have:  Pain or swelling in your legs.  Lumps or changes in your breasts or armpits.  Pain, burning, or bleeding when you urinate.  Unusual vaginal bleeding.  Dizziness or headaches.  Pain in your abdomen. Get help right away if you have:  Shortness of breath.  Chest pain.  Slurred speech.  Weakness or numbness in any part of your arms or legs. These symptoms may represent a serious problem that is an emergency. Do not wait to see if the symptoms will go away. Get medical help right away. Call your local emergency services (911 in the [place] Do not drive yourself to the hospital. Summary  Menopause is a normal time of life when menstrual periods stop completely and the ovaries stop producing the female hormones estrogen and progesterone.  HRT can reduce the frequency and severity of menopausal symptoms.  Risks of HRT vary depending on your individual health and  medical history. This information is not intended to replace advice given to you by your health care provider. Make sure you discuss any questions you have with your health care provider. Document Revised: 12/01/2019 Document Reviewed: 12/01/2019 Elsevier Patient Education  2021 [place]. Menopause Menopause is the normal time of a woman's life when menstrual periods stop completely. It marks the natural end to a woman's ability to become pregnant. It can be defined as the absence of a menstrual period for 12 months without another medical cause. The transition to menopause (perimenopause) most often happens between the ages of 72 and 33, and can last for many years. During perimenopause, hormone levels change in your body, which can cause symptoms and affect your health. Menopause may increase your risk for:  Weakened bones (osteoporosis), which causes fractures.  Depression.  Hardening and narrowing of the arteries (atherosclerosis), which can cause heart attacks and strokes. What are the causes? This condition is usually caused by a natural change in hormone levels that happens as you get older. The condition may also be caused by changes that are not natural, including:  Surgery to remove both ovaries (  surgical menopause).  Side effects from some medicines, such as chemotherapy used to treat cancer (chemical menopause). What increases the risk? This condition is more likely to start at an earlier age if you have certain medical conditions or have undergone treatments, including:  A tumor of the pituitary gland in the brain.  A disease that affects the ovaries and hormones.  Certain cancer treatments, such as chemotherapy or hormone therapy, or radiation therapy on the pelvis.  Heavy smoking and excessive alcohol use.  Family history of early menopause. This condition is also more likely to develop earlier in women who are very thin. What are the signs or symptoms? Symptoms of  this condition include:  Hot flashes.  Irregular menstrual periods.  Night sweats.  Changes in feelings about sex. This could be a decrease in sex drive or an increased discomfort around your sexuality.  Vaginal dryness and thinning of the vaginal walls. This may cause painful sex.  Dryness of the skin and development of wrinkles.  Headaches.  Problems sleeping (insomnia).  Mood swings or irritability.  Memory problems.  Weight gain.  Hair growth on the face and chest.  Bladder infections or problems with urinating. How is this diagnosed? This condition is diagnosed based on your medical history, a physical exam, your age, your menstrual history, and your symptoms. Hormone tests may also be done. How is this treated? In some cases, no treatment is needed. You and your health care provider should make a decision together about whether treatment is necessary. Treatment will be based on your individual condition and preferences. Treatment for this condition focuses on managing symptoms. Treatment may include:  Menopausal hormone therapy (MHT).  Medicines to treat specific symptoms or complications.  Acupuncture.  Vitamin or herbal supplements. Before starting treatment, make sure to let your health care provider know if you have a personal or family history of these conditions:  Heart disease.  Breast cancer.  Blood clots.  Diabetes.  Osteoporosis. Follow these instructions at home: Lifestyle  Do not use any products that contain nicotine or tobacco, such as cigarettes, e-cigarettes, and chewing tobacco. If you need help quitting, ask your health care provider.  Get at least 30 minutes of physical activity on 5 or more days each week.  Avoid alcoholic and caffeinated beverages, as well as spicy foods. This may help prevent hot flashes.  Get 7-8 hours of sleep each night.  If you have hot flashes, try: ? Dressing in layers. ? Avoiding things that may trigger  hot flashes, such as spicy food, warm places, or stress. ? Taking slow, deep breaths when a hot flash starts. ? Keeping a fan in your home and office.  Find ways to manage stress, such as deep breathing, meditation, or journaling.  Consider going to group therapy with other women who are having menopause symptoms. Ask your health care provider about recommended group therapy meetings. Eating and drinking  Eat a healthy, balanced diet that contains whole grains, lean protein, low-fat dairy, and plenty of fruits and vegetables.  Your health care provider may recommend adding more soy to your diet. Foods that contain soy include tofu, tempeh, and soy milk.  Eat plenty of foods that contain calcium and vitamin D for bone health. Items that are rich in calcium include low-fat milk, yogurt, beans, almonds, sardines, broccoli, and kale.   Medicines  Take over-the-counter and prescription medicines only as told by your health care provider.  Talk with your health care provider before starting any  herbal supplements. If prescribed, take vitamins and supplements as told by your health care provider. General instructions  Keep track of your menstrual periods, including: ? When they occur. ? How heavy they are and how long they last. ? How much time passes between periods.  Keep track of your symptoms, noting when they start, how often you have them, and how long they last.  Use vaginal lubricants or moisturizers to help with vaginal dryness and improve comfort during sex.  Keep all follow-up visits. This is important. This includes any group therapy or counseling.   Contact a health care provider if:  You are still having menstrual periods after age 29.  You have pain during sex.  You have not had a period for 12 months and you develop vaginal bleeding. Get help right away if you have:  Severe depression.  Excessive vaginal bleeding.  Pain when you urinate.  A fast or irregular  heartbeat (palpitations).  Severe headaches.  Abdominal pain or severe indigestion. Summary  Menopause is a normal time of life when menstrual periods stop completely. It is usually defined as the absence of a menstrual period for 12 months without another medical cause.  The transition to menopause (perimenopause) most often happens between the ages of 40 and 60 and can last for several years.  Symptoms can be managed through medicines, lifestyle changes, and complementary therapies such as acupuncture.  Eat a balanced diet that is rich in nutrients to promote bone health and heart health and to manage symptoms during menopause. This information is not intended to replace advice given to you by your health care provider. Make sure you discuss any questions you have with your health care provider. Document Revised: 02/27/2020 Document Reviewed: 11/13/2019 Elsevier Patient Education  [place].

## 2020-06-29 NOTE — Progress Notes (Signed)
GYNECOLOGY  VISIT   HPI: 70 y.o.   Single White or Caucasian Not Hispanic or Latino  female   G0P0000 with Patient's last menstrual period was 06/12/2000.   here for hot flashes. Her symptoms started 3 months ago. Having hot flashes 3+ x a day. She has her temp set at 68 at home and sometimes has to take her shirt off. She is waking up with mild night sweats ~3 x a week. She wakes up so early in the am, often before 6 am. Has trouble staying up at night. Mostly sleeps okay.  She is up most nights to void one time. Hard to go back to sleep if she is up at 3 or 4 am to void.    She has lost 33 lbs since 6/21 working with Dr Leafy Ro. At her visit with Dr Leafy Ro last week she c/o worsening vasomotor symptoms in the last couple of months.  She is s/p TAH/BSO in 11/20. She was on premarin until 2017.   She has a h/o type 2 DM, recent HgbA1C was 5.6! TFT's from last week were normal as was her CMP.  She is on medication for HTN and elevated lipids.   She has a FH of breast cancer. She had 4 sisters, one drowned in her early 39's. 2 sisters with breast cancer, one was premenopausal, one was postmenopausal. The PMP sister had negative genetic testin  GYNECOLOGIC HISTORY: Patient's last menstrual period was 06/12/2000. Contraception:PMP Menopausal hormone therapy: none        OB History    Gravida  0   Para  0   Term  0   Preterm  0   AB  0   Living  0     SAB  0   IAB  0   Ectopic  0   Multiple  0   Live Births  0              Patient Active Problem List   Diagnosis Date Noted  . Essential hypertension 03/03/2020    Past Medical History:  Diagnosis Date  . Depression   . Dysmenorrhea   . Fibroid   . Hyperlipemia   . Hyperlipidemia   . Hypertension   . Inverted nipple x several yrs   bilateral, pt states nipples have always been inverted  . Joint pain   . Obesity   . Sleep apnea   . SOB (shortness of breath)   . Vitamin D deficiency     Past  Surgical History:  Procedure Laterality Date  . BLEPHAROPLASTY Bilateral 02/21/2008  . BREAST BIOPSY Right   . BREAST EXCISIONAL BIOPSY Right    benign  . PELVIC LAPAROSCOPY    . TOTAL ABDOMINAL HYSTERECTOMY  05/04/2001    Current Outpatient Medications  Medication Sig Dispense Refill  . aspirin EC 81 MG tablet Take 81 mg by mouth daily.    . Calcium Carbonate-Vit D-Min (CALCIUM 1200 PO) Take by mouth daily.    . Cholecalciferol (VITAMIN D) 50 MCG (2000 UT) CAPS Take by mouth.    . erythromycin ophthalmic ointment 1 application at bedtime as needed.     . fluorouracil (EFUDEX) 5 % cream Apply topically 2 (two) times daily.    . hydrochlorothiazide (HYDRODIURIL) 25 MG tablet Take 1 tablet by mouth daily.    Marland Kitchen losartan (COZAAR) 100 MG tablet Take 1 tablet by mouth daily.  1  . Probiotic Product (PROBIOTIC-10 PO) Take by mouth.    Marland Kitchen  simvastatin (ZOCOR) 20 MG tablet Take 1 tablet by mouth daily.  6   No current facility-administered medications for this visit.     ALLERGIES: Patient has no known allergies.  Family History  Problem Relation Age of Onset  . Hypertension Mother   . Alzheimer's disease Mother   . Heart disease Mother   . Hyperlipidemia Mother   . Depression Mother   . Obesity Mother   . Hypertension Father   . Diabetes Father   . Stroke Father   . Obesity Father   . Breast cancer Sister   . Hypertension Sister   . Breast cancer Sister   . Hypertension Sister   . Hypertension Sister     Social History   Socioeconomic History  . Marital status: Single    Spouse name: Not on file  . Number of children: Not on file  . Years of education: Not on file  . Highest education level: Not on file  Occupational History  . Occupation: retired  Tobacco Use  . Smoking status: Never Smoker  . Smokeless tobacco: Never Used  Vaping Use  . Vaping Use: Never used  Substance and Sexual Activity  . Alcohol use: Yes    Alcohol/week: 1.0 - 5.0 standard drink    Types:  1 - 5 Glasses of wine per week  . Drug use: No  . Sexual activity: Not Currently    Birth control/protection: Post-menopausal, Surgical    Comment: Hysterectomy  Other Topics Concern  . Not on file  Social History Narrative  . Not on file   Social Determinants of Health   Financial Resource Strain: Not on file  Food Insecurity: Not on file  Transportation Needs: Not on file  Physical Activity: Not on file  Stress: Not on file  Social Connections: Not on file  Intimate Partner Violence: Not on file    Review of Systems  Endo/Heme/Allergies:       Hot flashes that have started since losing weight  All other systems reviewed and are negative.   PHYSICAL EXAMINATION:    BP 140/70 (BP Location: Right Arm, Patient Position: Sitting, Cuff Size: Normal)   Pulse 90   Resp 14   Ht 5\' 4"  (1.626 m)   Wt 195 lb 4 oz (88.6 kg)   LMP 06/12/2000   BMI 33.51 kg/m     General appearance: alert, cooperative and appears stated age Neck: no adenopathy, supple, symmetrical, trachea midline and thyroid normal to inspection and palpation  No axillary or inguinal adenopathy   1. Hot flashes Symptoms started in the last 3 months, 5 years out from getting off of ERT. She has been working on weight loss and has lost 33 lbs since 6/21. We discussed that adipose tissue can make estrogen, so it is possible the reduction in estrogen is causing her symptoms.  Normal TFT's, HgbA1C, CMP No lymphadenopathy - CBC with Differential/Platelet -Discussed behavioral changes to help with vasomotors symptoms (ie avoiding triggers, dressing in layers, keeping the house cool) -Discussed option of gabapentin particularly if the night sweats worsen -Discussed the risks of ERT, wouldn't recommend she go back on ERT at this time  2. Night sweats See above - CBC with Differential/Platelet  3. Weight loss intentional  CC: Dr Leafy Ro  Over 30 minutes in total patient care.

## 2020-07-09 DIAGNOSIS — Z20822 Contact with and (suspected) exposure to covid-19: Secondary | ICD-10-CM | POA: Diagnosis not present

## 2020-07-20 ENCOUNTER — Other Ambulatory Visit: Payer: Self-pay

## 2020-07-20 ENCOUNTER — Encounter (INDEPENDENT_AMBULATORY_CARE_PROVIDER_SITE_OTHER): Payer: Self-pay | Admitting: Family Medicine

## 2020-07-20 ENCOUNTER — Ambulatory Visit (INDEPENDENT_AMBULATORY_CARE_PROVIDER_SITE_OTHER): Payer: Medicare HMO | Admitting: Family Medicine

## 2020-07-20 VITALS — BP 116/72 | HR 83 | Temp 97.8°F | Ht 64.0 in | Wt 190.0 lb

## 2020-07-20 DIAGNOSIS — R5383 Other fatigue: Secondary | ICD-10-CM

## 2020-07-20 DIAGNOSIS — E559 Vitamin D deficiency, unspecified: Secondary | ICD-10-CM | POA: Diagnosis not present

## 2020-07-20 DIAGNOSIS — Z6832 Body mass index (BMI) 32.0-32.9, adult: Secondary | ICD-10-CM

## 2020-07-20 DIAGNOSIS — E669 Obesity, unspecified: Secondary | ICD-10-CM

## 2020-07-21 NOTE — Progress Notes (Signed)
Chief Complaint:   OBESITY Aarohi is here to discuss her progress with her obesity treatment plan along with follow-up of her obesity related diagnoses. Averil is on the Category 2 Plan + 100 calories and states she is following her eating plan approximately 90% of the time. Xyla states she is doing 0 minutes 0 times per week.  Today's visit was #: 10 Starting weight: 226 lbs Starting date: 12/06/2019 Today's weight: 190 lbs Today's date: 07/20/2020 Total lbs lost to date: 36 Total lbs lost since last in-office visit: 3  Interim History: Galadriel continues to do very well with weight loss on her eating plan. She likes the routine of the Category 2 plan, and she is not yet feeling bored.  Subjective:   1. Other fatigue Alanah has done well with weight loss, but she still notes daytime fatigue. She especially note feeling sleepy after meals and often needs to nap in the daytime. She sleeps approximately 8 hours consecutively. She has a history of obstructive sleep apnea. She is not using CPAP any longer. I discussed labs with the patient today.  2. Vitamin D deficiency Kimya's Vit D level is at goal It dropped a bit in January which is expected, but it is still doing well. I discussed labs with the patient today.  Assessment/Plan:   1. Other fatigue Ciela will continue diet and exercise, and she is to consider using CPAP and watch for signs of improving fatigue.  2. Vitamin D deficiency Low Vitamin D level contributes to fatigue and are associated with obesity, breast, and colon cancer. Concetta agreed to continue taking Vitamin D as is, and will follow-up for routine testing of Vitamin D, at least 2-3 times per year to avoid over-replacement.  3. Class 1 obesity with serious comorbidity and body mass index (BMI) of 32.0 to 32.9 in adult, unspecified obesity type Oasis is currently in the action stage of change. As such, her goal is to continue with weight loss efforts.  She has agreed to the Category 2 Plan + 100 calories.   Behavioral modification strategies: decreasing simple carbohydrates and meal planning and cooking strategies.  Verba has agreed to follow-up with our clinic in 4 weeks. She was informed of the importance of frequent follow-up visits to maximize her success with intensive lifestyle modifications for her multiple health conditions.   Objective:   Blood pressure 116/72, pulse 83, temperature 97.8 F (36.6 C), height 5\' 4"  (1.626 m), weight 190 lb (86.2 kg), last menstrual period 06/12/2000, SpO2 96 %. Body mass index is 32.61 kg/m.  General: Cooperative, alert, well developed, in no acute distress. HEENT: Conjunctivae and lids unremarkable. Cardiovascular: Regular rhythm.  Lungs: Normal work of breathing. Neurologic: No focal deficits.   Lab Results  Component Value Date   CREATININE 0.72 06/22/2020   BUN 20 06/22/2020   NA 140 06/22/2020   K 4.2 06/22/2020   CL 102 06/22/2020   CO2 23 06/22/2020   Lab Results  Component Value Date   ALT 21 06/22/2020   AST 19 06/22/2020   ALKPHOS 61 06/22/2020   BILITOT 0.4 06/22/2020   Lab Results  Component Value Date   HGBA1C 5.6 06/22/2020   HGBA1C 5.8 (H) 03/30/2020   HGBA1C 6.5 10/31/2019   Lab Results  Component Value Date   INSULIN 17.6 06/22/2020   INSULIN 13.9 03/30/2020   INSULIN 24.9 12/09/2019   Lab Results  Component Value Date   TSH 2.260 06/22/2020   Lab Results  Component Value Date   CHOL 194 06/22/2020   HDL 64 06/22/2020   LDLCALC 113 (H) 06/22/2020   TRIG 94 06/22/2020   Lab Results  Component Value Date   WBC 11.7 (H) 06/29/2020   HGB 14.0 06/29/2020   HCT 41.3 06/29/2020   MCV 90.0 06/29/2020   PLT 199 06/29/2020   No results found for: IRON, TIBC, FERRITIN  Attestation Statements:   Reviewed by clinician on day of visit: allergies, medications, problem list, medical history, surgical history, family history, social history, and  previous encounter notes.  Time spent on visit including pre-visit chart review and post-visit care and charting was 32 minutes.    I, Trixie Dredge, am acting as transcriptionist for [name], MD.  I have reviewed the above documentation for accuracy and completeness, and I agree with the above. -  [name], MD

## 2020-07-23 DIAGNOSIS — H52222 Regular astigmatism, left eye: Secondary | ICD-10-CM | POA: Diagnosis not present

## 2020-07-23 DIAGNOSIS — H52221 Regular astigmatism, right eye: Secondary | ICD-10-CM | POA: Diagnosis not present

## 2020-07-23 DIAGNOSIS — H5212 Myopia, left eye: Secondary | ICD-10-CM | POA: Diagnosis not present

## 2020-07-23 DIAGNOSIS — H524 Presbyopia: Secondary | ICD-10-CM | POA: Diagnosis not present

## 2020-07-23 DIAGNOSIS — H5201 Hypermetropia, right eye: Secondary | ICD-10-CM | POA: Diagnosis not present

## 2020-07-26 DIAGNOSIS — H25812 Combined forms of age-related cataract, left eye: Secondary | ICD-10-CM | POA: Diagnosis not present

## 2020-07-26 DIAGNOSIS — H43812 Vitreous degeneration, left eye: Secondary | ICD-10-CM | POA: Diagnosis not present

## 2020-07-26 DIAGNOSIS — H35033 Hypertensive retinopathy, bilateral: Secondary | ICD-10-CM | POA: Diagnosis not present

## 2020-07-26 DIAGNOSIS — H2513 Age-related nuclear cataract, bilateral: Secondary | ICD-10-CM | POA: Diagnosis not present

## 2020-07-28 ENCOUNTER — Other Ambulatory Visit: Payer: Self-pay

## 2020-07-28 ENCOUNTER — Ambulatory Visit
Admission: RE | Admit: 2020-07-28 | Discharge: 2020-07-28 | Disposition: A | Discharge status: Home / Self Care | Payer: Medicare HMO | Source: Ambulatory Visit | Attending: Obstetrics and Gynecology | Admitting: Obstetrics and Gynecology

## 2020-07-28 DIAGNOSIS — Z1231 Encounter for screening mammogram for malignant neoplasm of breast: Secondary | ICD-10-CM

## 2020-07-30 ENCOUNTER — Other Ambulatory Visit: Payer: Self-pay | Admitting: *Deleted

## 2020-07-30 ENCOUNTER — Other Ambulatory Visit: Payer: Self-pay

## 2020-07-30 ENCOUNTER — Other Ambulatory Visit: Payer: Self-pay | Admitting: Obstetrics and Gynecology

## 2020-07-30 ENCOUNTER — Other Ambulatory Visit (INDEPENDENT_AMBULATORY_CARE_PROVIDER_SITE_OTHER): Payer: Medicare HMO

## 2020-07-30 DIAGNOSIS — D72829 Elevated white blood cell count, unspecified: Secondary | ICD-10-CM | POA: Diagnosis not present

## 2020-07-30 LAB — CBC WITH DIFFERENTIAL/PLATELET
Absolute Monocytes: 874 cells/uL (ref 200–950)
Basophils Absolute: 42 cells/uL (ref 0–200)
Basophils Relative: 0.5 %
Eosinophils Absolute: 109 cells/uL (ref 15–500)
Eosinophils Relative: 1.3 %
HCT: 38.8 % (ref 35.0–45.0)
Hemoglobin: 13.1 g/dL (ref 11.7–15.5)
Lymphs Abs: 2251 cells/uL (ref 850–3900)
MCH: 30.3 pg (ref 27.0–33.0)
MCHC: 33.8 g/dL (ref 32.0–36.0)
MCV: 89.6 fL (ref 80.0–100.0)
MPV: 13.5 fL — ABNORMAL HIGH (ref 7.5–12.5)
Monocytes Relative: 10.4 %
Neutro Abs: 5124 cells/uL (ref 1500–7800)
Neutrophils Relative %: 61 %
Platelets: 178 10*3/uL (ref 140–400)
RBC: 4.33 10*6/uL (ref 3.80–5.10)
RDW: 12 % (ref 11.0–15.0)
Total Lymphocyte: 26.8 %
WBC: 8.4 10*3/uL (ref 3.8–10.8)

## 2020-08-09 DIAGNOSIS — H2512 Age-related nuclear cataract, left eye: Secondary | ICD-10-CM | POA: Diagnosis not present

## 2020-08-17 ENCOUNTER — Encounter (INDEPENDENT_AMBULATORY_CARE_PROVIDER_SITE_OTHER): Payer: Self-pay | Admitting: Family Medicine

## 2020-08-17 ENCOUNTER — Other Ambulatory Visit: Payer: Self-pay

## 2020-08-17 ENCOUNTER — Ambulatory Visit (INDEPENDENT_AMBULATORY_CARE_PROVIDER_SITE_OTHER): Payer: Medicare HMO | Admitting: Family Medicine

## 2020-08-17 VITALS — BP 113/74 | HR 75 | Temp 98.5°F | Ht 64.0 in | Wt 185.0 lb

## 2020-08-17 DIAGNOSIS — E669 Obesity, unspecified: Secondary | ICD-10-CM

## 2020-08-17 DIAGNOSIS — M25562 Pain in left knee: Secondary | ICD-10-CM | POA: Diagnosis not present

## 2020-08-17 DIAGNOSIS — M25561 Pain in right knee: Secondary | ICD-10-CM

## 2020-08-17 DIAGNOSIS — M25551 Pain in right hip: Secondary | ICD-10-CM

## 2020-08-17 DIAGNOSIS — Z6831 Body mass index (BMI) 31.0-31.9, adult: Secondary | ICD-10-CM

## 2020-08-18 NOTE — Progress Notes (Signed)
Chief Complaint:   OBESITY Tina Sutton is here to discuss her progress with her obesity treatment plan along with follow-up of her obesity related diagnoses. Tina Sutton is on the Category 2 Plan + 100 calories and states she is following her eating plan approximately 90% of the time. Tina Sutton states she is doing 0 minutes 0 times per week.  Today's visit was #: 11 Starting weight: 226 lbs Starting date: 12/06/2019 Today's weight: 185 lbs Today's date: 08/17/2020 Total lbs lost to date: 41 Total lbs lost since last in-office visit: 5  Interim History: Tina Sutton continues to do well with with loss. Her hunger is mostly controlled, but she does have fluctuating hunger at time.  Subjective:   1. Pain in both knees, and hip pain Tina Sutton has a history of bilateral knee pain. She notes some increased hip pain, especially on the right side. She has not had a formal evaluation of this pain that has not improved even with significant weight loss.  Assessment/Plan:   1. Pain in both knees, and hip pain We will refer to Dr. [name] and Tina Sutton will continue to follow up as directed.  - Ambulatory referral to Sports Medicine  2. Class 1 obesity with serious comorbidity and body mass index (BMI) of 31.0 to 31.9 in adult, unspecified obesity type Tina Sutton is currently in the action stage of change. As such, her goal is to continue with weight loss efforts. She has agreed to the Category 2 Plan + 100 calories.   Behavioral modification strategies: increasing lean protein intake and meal planning and cooking strategies.  Tina Sutton has agreed to follow-up with our clinic in 4 weeks. She was informed of the importance of frequent follow-up visits to maximize her success with intensive lifestyle modifications for her multiple health conditions.   Objective:   Blood pressure 113/74, pulse 75, temperature 98.5 F (36.9 C), height 5\' 4"  (1.626 m), weight 185 lb (83.9 kg), last menstrual period 06/12/2000,  SpO2 97 %. Body mass index is 31.76 kg/m.  General: Cooperative, alert, well developed, in no acute distress. HEENT: Conjunctivae and lids unremarkable. Cardiovascular: Regular rhythm.  Lungs: Normal work of breathing. Neurologic: No focal deficits.   Lab Results  Component Value Date   CREATININE 0.72 06/22/2020   BUN 20 06/22/2020   NA 140 06/22/2020   K 4.2 06/22/2020   CL 102 06/22/2020   CO2 23 06/22/2020   Lab Results  Component Value Date   ALT 21 06/22/2020   AST 19 06/22/2020   ALKPHOS 61 06/22/2020   BILITOT 0.4 06/22/2020   Lab Results  Component Value Date   HGBA1C 5.6 06/22/2020   HGBA1C 5.8 (H) 03/30/2020   HGBA1C 6.5 10/31/2019   Lab Results  Component Value Date   INSULIN 17.6 06/22/2020   INSULIN 13.9 03/30/2020   INSULIN 24.9 12/09/2019   Lab Results  Component Value Date   TSH 2.260 06/22/2020   Lab Results  Component Value Date   CHOL 194 06/22/2020   HDL 64 06/22/2020   LDLCALC 113 (H) 06/22/2020   TRIG 94 06/22/2020   Lab Results  Component Value Date   WBC 8.4 07/30/2020   HGB 13.1 07/30/2020   HCT 38.8 07/30/2020   MCV 89.6 07/30/2020   PLT 178 07/30/2020   No results found for: IRON, TIBC, FERRITIN  Attestation Statements:   Reviewed by clinician on day of visit: allergies, medications, problem list, medical history, surgical history, family history, social history, and previous encounter notes.  Time spent on visit including pre-visit chart review and post-visit care and charting was 20 minutes.    I, Trixie Dredge, am acting as transcriptionist for [name], MD.  I have reviewed the above documentation for accuracy and completeness, and I agree with the above. -  [name], MD

## 2020-08-20 NOTE — Progress Notes (Signed)
Subjective:    CC: B hip, R>L, and B knee pain  I, Molly Weber, LAT, ATC, am serving as scribe for Dr. [name].  HPI: Pt is a 70 y/o female presenting w/ c/o B hip (R>L) x approximately one month w/ no known MOI.  Her B knee pain is chronic x several years..  Her knee pain is chronic in nature but the hip pain is a newer issue.  Of note, the pt has lost 40 lbs in the last 8 months.  She locates her pain to her B ant hips and states that her pain is brief in nature and "comes out of nowhere."  Low back pain: No Hip mechanical symptoms: No Radiating pain: No Aggravating factors: laying down at night; sitting;  Treatments tried: Nothing  Pertinent review of Systems: No fevers or chills  Relevant historical information: Hypertension   Objective:    Vitals:   08/23/20 0843  BP: 128/78  Pulse: 72  SpO2: 98%   General: Well Developed, well nourished, and in no acute distress.   MSK:  Right hip: Normal-appearing Normal motion pain with flexion and internal rotation. Not particularly tender to palpation. Reduced strength to hip abduction and external rotation 4/5 with some pain. Some pain reproduced with resisted hip flexion as well although not much reduced strength. Positive FABER and FADIR test  Left hip normal-appearing normal motion without pain. Redo strength hip abduction and external rotation 4/5 without pain. Negative FABER and FADIR test  Right knee normal-appearing normal motion with crepitation. Tender palpation medial knee joint line. Stable ligamentous exam. Intact strength.  Leg lengths equal  Lab and Radiology Results  X-ray bilateral hips and right knee obtained today personally independently interpreted.  Right hip: Mild degenerative changes no acute fractures.  Left hip: Minimal degenerative changes no acute fractures.  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  Await formal radiology review   Impression and Recommendations:     Assessment and Plan: 70 y.o. female with right anterior hip pain due to impingement or DJD.  Plan for physical therapy. Recheck in about 6 weeks.  If not improved consider diagnostic and therapeutic injection versus MRI arthrogram.  Left hip pain lesser issue.  Again physical therapy.  Right knee pain again thought to be due to DJD.  May have a component of meniscus tear as well.  Plan for physical therapy and Voltaren gel.  Trial of steroid injection if not improving.  Recheck 6 weeks. PDMP not reviewed this encounter. Orders Placed This Encounter  Procedures  . DG HIPS BILAT WITH PELVIS 3-4 VIEWS    Standing Status:   Future    Number of Occurrences:   1    Standing Expiration Date:   08/23/2021    Order Specific Question:   Reason for Exam (SYMPTOM  OR DIAGNOSIS REQUIRED)    Answer:   eval BL hip hip pain    Order Specific Question:   Preferred imaging location?    Answer:   [facility]  . DG Knee AP/LAT W/Sun[facility] Right    Standing Status:   Future    Number of Occurrences:   1    Standing Expiration Date:   08/23/2021    Order Specific Question:   Reason for Exam (SYMPTOM  OR DIAGNOSIS REQUIRED)    Answer:   eval knee pain    Order Specific Question:   Preferred imaging location?    Answer:   [facility]  . Ambulatory  referral to Physical Therapy    Referral Priority:   Routine    Referral Type:   Physical Medicine    Referral Reason:   Specialty Services Required    Requested Specialty:   Physical Therapy   No orders of the defined types were placed in this encounter.   Discussed warning signs or symptoms. Please see discharge instructions. Patient expresses understanding.   The above documentation has been reviewed and is accurate and complete [name], M.D.

## 2020-08-23 ENCOUNTER — Other Ambulatory Visit: Payer: Self-pay

## 2020-08-23 ENCOUNTER — Ambulatory Visit: Payer: Self-pay

## 2020-08-23 ENCOUNTER — Ambulatory Visit (INDEPENDENT_AMBULATORY_CARE_PROVIDER_SITE_OTHER): Payer: Medicare HMO

## 2020-08-23 ENCOUNTER — Encounter: Payer: Self-pay | Admitting: Family Medicine

## 2020-08-23 ENCOUNTER — Ambulatory Visit (INDEPENDENT_AMBULATORY_CARE_PROVIDER_SITE_OTHER): Payer: Medicare HMO | Admitting: Family Medicine

## 2020-08-23 VITALS — BP 128/78 | HR 72 | Ht 64.0 in | Wt 191.6 lb

## 2020-08-23 DIAGNOSIS — M25552 Pain in left hip: Secondary | ICD-10-CM | POA: Diagnosis not present

## 2020-08-23 DIAGNOSIS — G8929 Other chronic pain: Secondary | ICD-10-CM

## 2020-08-23 DIAGNOSIS — M25559 Pain in unspecified hip: Secondary | ICD-10-CM

## 2020-08-23 DIAGNOSIS — M25551 Pain in right hip: Secondary | ICD-10-CM

## 2020-08-23 DIAGNOSIS — M25561 Pain in right knee: Secondary | ICD-10-CM

## 2020-08-23 DIAGNOSIS — M1711 Unilateral primary osteoarthritis, right knee: Secondary | ICD-10-CM | POA: Diagnosis not present

## 2020-08-23 NOTE — Patient Instructions (Signed)
Thank you for coming in today.  Please get an Xray today before you leave  I've referred you to Physical Therapy.  Let us know if you don't hear from them in one week.  Please use voltaren gel up to 4x daily for pain as needed.   Recheck in 6 weeks.   I will get xray results to you soon.   Let me know if you have any problems getting in to PT.

## 2020-08-23 NOTE — Progress Notes (Signed)
Bilateral hips show no fractures or dislocation.  Mild arthritis present besides.

## 2020-08-23 NOTE — Progress Notes (Signed)
X-ray right knee shows mild arthritis.

## 2020-08-24 DIAGNOSIS — H25812 Combined forms of age-related cataract, left eye: Secondary | ICD-10-CM | POA: Diagnosis not present

## 2020-08-24 DIAGNOSIS — H268 Other specified cataract: Secondary | ICD-10-CM | POA: Diagnosis not present

## 2020-08-26 ENCOUNTER — Encounter: Payer: Self-pay | Admitting: Physical Therapy

## 2020-08-26 ENCOUNTER — Other Ambulatory Visit: Payer: Self-pay

## 2020-08-26 ENCOUNTER — Ambulatory Visit: Payer: Medicare HMO | Admitting: Physical Therapy

## 2020-08-26 DIAGNOSIS — M25561 Pain in right knee: Secondary | ICD-10-CM | POA: Diagnosis not present

## 2020-08-26 DIAGNOSIS — M25552 Pain in left hip: Secondary | ICD-10-CM

## 2020-08-26 DIAGNOSIS — M6281 Muscle weakness (generalized): Secondary | ICD-10-CM | POA: Diagnosis not present

## 2020-08-26 DIAGNOSIS — H25812 Combined forms of age-related cataract, left eye: Secondary | ICD-10-CM | POA: Diagnosis not present

## 2020-08-26 DIAGNOSIS — M25551 Pain in right hip: Secondary | ICD-10-CM

## 2020-08-26 NOTE — Patient Instructions (Signed)
Access Code: [email or web address] URL: [email or web address] Date: 08/26/2020 Prepared by: Daleen Bo  Exercises Side Lunge Adductor Stretch - 2 x daily - 7 x weekly - 2 sets - 3 reps - 30 hold Prone Quadriceps Stretch with Strap - 2 x daily - 7 x weekly - 1 sets - 3 reps - 30 hold Sidelying Hip Abduction - 1 x daily - 7 x weekly - 2 sets - 10 reps

## 2020-08-26 NOTE — Progress Notes (Signed)
70 y.o. G0P0000 Single White or Caucasian Not Hispanic or Latino female here for annual exam. Patient is wondering when her dexa is due.   She is having bowel trouble. Normal BM daily, occasionally she noticed some stool on the toilet pap a long time after she had a BM. Stools are soft. Not incontinent of stool or gas.   2-3 x she had the sensation that she was sitting on something. It happened 1 x a month for a couple of months. Noticed while riding in the car. Never noticed a bulge. By the time she reached her destination the sensation was gone.   No bladder c/o, no leakage.   H/O TAH/BSO  She has been going to the weight loss clinic and has lost over 40 lbs. About 1/2 way to her goal.   She was seen 2 months ago c/o new onset of hot flashes x 3 months. Normal TFT's, HgbA1C (5.6%), CMP. Her WBC count was minimally elevated, repeat WBC was normal.  The hot flashes have improved some since her last visit.   H/O well controlled type 2 DM    Patient's last menstrual period was 06/12/2000.          Sexually active: No.  The current method of family planning is post menopausal status.    Exercising: No.  The patient does not participate in regular exercise at present. Smoker:  no  Health Maintenance: Pap:  07/02/15 [facility] - per patient normal History of abnormal Pap:  no MMG:  07/31/20 density C Bi-rads 1 neg  BMD:    6/17/2019mildosteopenia, T score -1.2, FRAX 14.3/0.8% Colonoscopy:11/23/2017 polyps, f/u 2024  TDaP:  06/25/2018 Gardasil: NA   reports that she has never smoked. She has never used smokeless tobacco. She reports current alcohol use of about 1.0 - 5.0 standard drink of alcohol per week. She reports that she does not use drugs. Retired, she was a Network engineer for [employer].She lived in [place] for 3 years while working for [employer].  Past Medical History:  Diagnosis Date  . Depression   . Dysmenorrhea   . Fibroid   . Hyperlipemia   .  Hyperlipidemia   . Hypertension   . Inverted nipple x several yrs   bilateral, pt states nipples have always been inverted  . Joint pain   . Obesity   . Sleep apnea   . SOB (shortness of breath)   . Vitamin D deficiency     Past Surgical History:  Procedure Laterality Date  . BLEPHAROPLASTY Bilateral 02/21/2008  . BREAST BIOPSY Right   . BREAST EXCISIONAL BIOPSY Right    benign  . PELVIC LAPAROSCOPY    . TOTAL ABDOMINAL HYSTERECTOMY  05/04/2001    Current Outpatient Medications  Medication Sig Dispense Refill  . aspirin EC 81 MG tablet Take 81 mg by mouth daily.    . Calcium Carbonate-Vit D-Min (CALCIUM 1200 PO) Take by mouth daily.    . Cholecalciferol (VITAMIN D) 50 MCG (2000 UT) CAPS Take by mouth.    . erythromycin ophthalmic ointment 1 application at bedtime as needed.     . fluorouracil (EFUDEX) 5 % cream Apply topically 2 (two) times daily.    . hydrochlorothiazide (HYDRODIURIL) 25 MG tablet Take 1 tablet by mouth daily.    Marland Kitchen losartan (COZAAR) 100 MG tablet Take 1 tablet by mouth daily.  1  . Probiotic Product (PROBIOTIC-10 PO) Take by mouth.    . simvastatin (ZOCOR) 20 MG tablet  Take 1 tablet by mouth daily.  6   No current facility-administered medications for this visit.    Family History  Problem Relation Age of Onset  . Hypertension Mother   . Alzheimer's disease Mother   . Heart disease Mother   . Hyperlipidemia Mother   . Depression Mother   . Obesity Mother   . Hypertension Father   . Diabetes Father   . Stroke Father   . Obesity Father   . Breast cancer Sister   . Hypertension Sister   . Breast cancer Sister   . Hypertension Sister   . Hypertension Sister   She has a FH of breast cancer. She had 4 sisters, one drowned in her early 34's. 2 sisters with breast cancer, one was premenopausal, one was postmenopausal. The PMP sister had negative genetic testing  Review of Systems  All other systems reviewed and are negative.   Exam:   BP 122/64    Pulse 74   Ht 5\' 4"  (1.626 m)   Wt 189 lb (85.7 kg)   LMP 06/12/2000   SpO2 99%   BMI 32.44 kg/m   Weight change: @WEIGHTCHANGE @ Height:   Height: 5\' 4"  (162.6 cm)  Ht Readings from Last 3 Encounters:  08/30/20 5\' 4"  (1.626 m)  08/23/20 5\' 4"  (1.626 m)  08/17/20 5\' 4"  (1.626 m)    General appearance: alert, cooperative and appears stated age Head: Normocephalic, without obvious abnormality, atraumatic Neck: no adenopathy, supple, symmetrical, trachea midline and thyroid normal to inspection and palpation Breasts: normal appearance, no masses or tenderness, bilaterally inverted nipples (long term) Abdomen: soft, non-tender; non distended,  no masses,  no organomegaly Extremities: extremities normal, atraumatic, no cyanosis or edema Skin: Skin color, texture, turgor normal. No rashes or lesions Lymph nodes: Cervical, supraclavicular, and axillary nodes normal. No abnormal inguinal nodes palpated Neurologic: Grossly normal   Pelvic: External genitalia:  no lesions              Urethra:  normal appearing urethra with no masses, tenderness or lesions              Bartholins and Skenes: normal                 Vagina: atrophic appearing vagina with normal color and discharge, no lesions. No prolapse noted with valsalva              Cervix: absent               Bimanual Exam:  Uterus:  uterus absent              Adnexa: no mass, fullness, tenderness               Rectovaginal: Confirms               Anus:  normal sphincter tone, no lesions  Karmen Bongo chaperoned for the exam.   1. Well woman exam Discussed breast self exam Discussed calcium and vit D intake Labs at weight loss clinic Mammogram UTD No pap needed  2. Family history of breast cancer Declines referral to Genetics Discussed breast self examination, information given  3. Osteopenia, unspecified location Will plan a DEXA in the next 1-2 years Recommended weight bearing exercise in addition to getting  adequate calcium and vit D.  Discussed importance of balance.

## 2020-08-27 NOTE — Therapy (Signed)
[facility] [address] [place], [place], [address] Phone: [phone]   Fax:  [phone]  Physical Therapy Evaluation  Patient Details  Name: Tina Sutton MRN: [id number] Date of Birth: [birth year] Referring Provider (PT): Dr. Georgina Snell   Encounter Date: 08/26/2020   PT End of Session - 08/27/20 1150    Visit Number 1    Number of Visits 13    Date for PT Re-Evaluation 09/26/20    Authorization Type [employer]    PT Start Time 9030    PT Stop Time 1516    PT Time Calculation (min) 43 min    Activity Tolerance Patient tolerated treatment well;Patient limited by pain    Behavior During Therapy [facility] for tasks assessed/performed           Past Medical History:  Diagnosis Date  . Depression   . Dysmenorrhea   . Fibroid   . Hyperlipemia   . Hyperlipidemia   . Hypertension   . Inverted nipple x several yrs   bilateral, pt states nipples have always been inverted  . Joint pain   . Obesity   . Sleep apnea   . SOB (shortness of breath)   . Vitamin D deficiency     Past Surgical History:  Procedure Laterality Date  . BLEPHAROPLASTY Bilateral 02/21/2008  . BREAST BIOPSY Right   . BREAST EXCISIONAL BIOPSY Right    benign  . PELVIC LAPAROSCOPY    . TOTAL ABDOMINAL HYSTERECTOMY  05/04/2001    There were no vitals filed for this visit.    Subjective Assessment - 08/26/20 1440    Subjective Pt states she has had hip pain R>L for the last month. Laying and sitting down cause pain, especially laying in bed at night. She typically has pain laying directly onto R side bc of use with CPAP. She states she has lost 40 lbs recently. She states the R knee has been an ongoing issue (10 years). Pt states she has pain from the front of the hip to the side. She states the pain does not last long and is beyond superficial. Pt denies traumatic injury. She states that rotating the hip out while at the MD visit really brought on the pain. She  states she usually lays onto her side and the pain is sometimes almost immediate when it occurs. Worst 5/10 Best 0/10 Current 0/10. She states the R knee has bothered her more recently. No previous injury to R knee. Pt states there are moments of grinding. Pt states she has troubles with stairs bc of the knee. She states she has always done step to pattern on the stairs. Pt denies red flag.    How long can you stand comfortably? >1hr    How long can you walk comfortably? >1hr    Diagnostic tests X-ray of hip and knee    Patient Stated Goals Return to activity without any pain    Currently in Pain? No/denies    Multiple Pain Sites No              [facility] PT Assessment - 08/27/20 0001      Assessment   Medical Diagnosis Bil hip and R knee pain    Referring Provider (PT) Dr. Georgina Snell      Precautions   Precautions None      Restrictions   Weight Bearing Restrictions No      Balance Screen   Has the patient fallen in the past 6 months No  [place] residence      Prior Function   Level of Independence Independent      Cognition   Overall Cognitive Status Within Functional Limits for tasks assessed      Observation/Other Assessments   Other Surveys  Lower Extremity Functional Scale    Lower Extremity Functional Scale  67% function      Functional Tests   Functional tests Sit to Stand      Sit to Stand   Comments L> R WB, UE support      ROM / Strength   AROM / PROM / Strength AROM;PROM;Strength      AROM   Overall AROM Comments L/S ROM WFL; R hip mod limited in flexion, extension, ADD, and IR; L hip WFL without pain   p! with IR on R     PROM   Overall PROM  Deficits    Overall PROM Comments L WFL, R most limited in IR, mod limited in other directions      Strength   Overall Strength Comments 4/5 bilat through hips, bilat knee 4+/5 flexion and ext      Flexibility   Soft Tissue Assessment /Muscle Length yes    Hamstrings  Decreased bilaterally    Quadriceps R>L limitation, mod    ITB R tension with palpation      Palpation   SI assessment  no malalignment noted    Palpation comment TTP of R lateral hip and quads      Special Tests    Special Tests Hip Special Tests    Hip Special Tests  Saralyn Pilar (FABER) Test;Trendelenberg Test;Hip Scouring;Anterior Hip Impingement Test      Saralyn Pilar [facility]) Test   Findings Positive    Side Right      Trendelenburg Test   Findings Positive    Side Right;Left      Hip Scouring   Findings Positive    Side Right      Anterior Hip Impingement Test    Findings Positive    Side  Right      Ambulation/Gait   Gait Pattern Trendelenburg;Decreased stance time - right                      Objective measurements completed on examination: See above findings.       [place] Adult PT Treatment/Exercise - 08/27/20 0001      Exercises   Exercises Knee/Hip      Knee/Hip Exercises: Stretches   Quad Stretch 30 seconds;3 reps    Quad Stretch Limitations prone with strap    Other Knee/Hip Stretches standing lunge stretch at stairs 30s 3x   unable to tolerate trial of thomas stretch     Knee/Hip Exercises: Sidelying   Hip ABduction 2 sets;10 reps    Hip ABduction Limitations cuing needed for hip extension      Manual Therapy   Manual Therapy Joint mobilization;Soft tissue mobilization    Joint Mobilization LAD grade II 3 min                  PT Education - 08/27/20 1150    Education Details MOI, prognosis, diagnosis, POC, HEP, sleeping position, posture, muscle firing    Person(s) Educated Patient    Methods Explanation;Demonstration;Tactile cues;Verbal cues;Handout    Comprehension Verbalized understanding;Returned demonstration            PT Short Term Goals - 08/27/20 1202  PT SHORT TERM GOAL #1   Title Pt will become independent with HEP in order to demonstrate synthesis of PT education.    Time 2    Period Weeks    Status New       PT SHORT TERM GOAL #2   Title Pt will be able to demonstrate R S/L without pain in order to demonstrate functional improvement in R LE static positioning in order to promote proper sleep and recovery.    Time 3    Period Weeks    Status New             PT Long Term Goals - 08/27/20 1203      PT LONG TERM GOAL #1   Title Pt will be able to demonstrate reciprocal stair management pattern in order to demonstrate functional improvement in LE function for improve mobility for house hold duties.    Time 6    Period Weeks    Status New      PT LONG TERM GOAL #2   Title Pt will be able to perform >10 STS without UE support in order to demonstrate functional improvement in R LE strength and tolerate to loaded activity.    Time 6    Period Weeks    Status New                  Plan - 08/26/20 1515    Clinical Impression Statement Pt is a 70 y.o. female presenting to PT eval today for cc of bilateral hip pain and R knee pain. Pt's R knee is more bothersome than L. Pt presents with decreased R hipand R ROM, decreased strength, and decreased hip and knee joint mobility. Pt's s/s appear consistent with R anterior hip impingement and R PFPS as conferred by knee imaging. Pt's impairments limit her ability to self care, household duties, sleep,  and community ambulation. Pt would benefit from continued skilled therapy in order to maximize R LE strength and function for return to full ADL and prevent further decline.    Personal Factors and Comorbidities Fitness;Time since onset of injury/illness/exacerbation    Examination-Activity Limitations Sleep;Lift;Carry;Stairs;Squat    Examination-Participation Restrictions Community Activity;Other    Stability/Clinical Decision Making Stable/Uncomplicated    Clinical Decision Making Low    Rehab Potential Good    PT Frequency 2x / week    PT Duration 6 weeks    PT Treatment/Interventions ADLs/Self Care Home Management;Cryotherapy;Electrical  Stimulation;Iontophoresis 4mg /ml Dexamethasone;Moist Heat;Biofeedback;Traction;Ultrasound;Gait training;Stair training;Functional mobility training;Therapeutic activities;Therapeutic exercise;Balance training;Neuromuscular re-education;Patient/family education;Orthotic Fit/Training;Manual techniques;Passive range of motion;Dry needling;Taping;Spinal Manipulations;Joint Manipulations    PT Next Visit Plan review HEP, trial stand hip ABD, lateral hip mob, scoop inf hip mob, LAQ    PT Home Exercise Plan [email or web address]    Consulted and Agree with Plan of Care Patient           Patient will benefit from skilled therapeutic intervention in order to improve the following deficits and impairments:  Abnormal gait,Pain,Hypomobility,Decreased activity tolerance,Decreased strength,Decreased balance,Decreased mobility,Decreased range of motion,Impaired flexibility  Visit Diagnosis: Right hip pain  Left hip pain  Pain in joint of right knee  Muscle weakness     Problem List Patient Active Problem List   Diagnosis Date Noted  . Essential hypertension 03/03/2020    Daleen Bo PT, DPT 08/27/20 12:08 PM   [facility] [address] [place], [place], [address] Phone: [phone]   Fax:  [phone]  Name: Calyse Murcia MRN: [id number] Date  of Birth: 06/21/1950

## 2020-08-30 ENCOUNTER — Encounter: Payer: Self-pay | Admitting: Obstetrics and Gynecology

## 2020-08-30 ENCOUNTER — Other Ambulatory Visit: Payer: Self-pay

## 2020-08-30 ENCOUNTER — Ambulatory Visit: Payer: Medicare HMO | Admitting: Obstetrics and Gynecology

## 2020-08-30 VITALS — BP 122/64 | HR 74 | Ht 64.0 in | Wt 189.0 lb

## 2020-08-30 DIAGNOSIS — Z01419 Encounter for gynecological examination (general) (routine) without abnormal findings: Secondary | ICD-10-CM

## 2020-08-30 DIAGNOSIS — Z803 Family history of malignant neoplasm of breast: Secondary | ICD-10-CM

## 2020-08-30 DIAGNOSIS — I129 Hypertensive chronic kidney disease with stage 1 through stage 4 chronic kidney disease, or unspecified chronic kidney disease: Secondary | ICD-10-CM | POA: Insufficient documentation

## 2020-08-30 DIAGNOSIS — C4330 Malignant melanoma of unspecified part of face: Secondary | ICD-10-CM | POA: Insufficient documentation

## 2020-08-30 DIAGNOSIS — Z860100 Personal history of colon polyps, unspecified: Secondary | ICD-10-CM | POA: Insufficient documentation

## 2020-08-30 DIAGNOSIS — M8580 Other specified disorders of bone density and structure, unspecified site: Secondary | ICD-10-CM

## 2020-08-30 DIAGNOSIS — Z85828 Personal history of other malignant neoplasm of skin: Secondary | ICD-10-CM | POA: Insufficient documentation

## 2020-08-30 DIAGNOSIS — J309 Allergic rhinitis, unspecified: Secondary | ICD-10-CM | POA: Insufficient documentation

## 2020-08-30 DIAGNOSIS — E1169 Type 2 diabetes mellitus with other specified complication: Secondary | ICD-10-CM | POA: Insufficient documentation

## 2020-08-30 DIAGNOSIS — E559 Vitamin D deficiency, unspecified: Secondary | ICD-10-CM | POA: Insufficient documentation

## 2020-08-30 DIAGNOSIS — H25811 Combined forms of age-related cataract, right eye: Secondary | ICD-10-CM | POA: Diagnosis not present

## 2020-08-30 DIAGNOSIS — E785 Hyperlipidemia, unspecified: Secondary | ICD-10-CM | POA: Insufficient documentation

## 2020-08-30 NOTE — Patient Instructions (Signed)
EXERCISE   We recommended that you start or continue a regular exercise program for good health. Physical activity is anything that gets your body moving, some is better than none. The CDC recommends 150 minutes per week of Moderate-Intensity Aerobic Activity and 2 or more days of Muscle Strengthening Activity.  Benefits of exercise are limitless: helps weight loss/weight maintenance, improves mood and energy, helps with depression and anxiety, improves sleep, tones and strengthens muscles, improves balance, improves bone density, protects from chronic conditions such as heart disease, high blood pressure and diabetes and so much more. To learn more visit: https://www.cdc.gov/physicalactivity/index.html  DIET: Good nutrition starts with a healthy diet of fruits, vegetables, whole grains, and lean protein sources. Drink plenty of water for hydration. Minimize empty calories, sodium, sweets. For more information about dietary recommendations visit: https://health.gov/our-work/nutrition-physical-activity/dietary-guidelines and https://www.myplate.gov/  ALCOHOL:  Women should limit their alcohol intake to no more than 7 drinks/beers/glasses of wine (combined, not each!) per week. Moderation of alcohol intake to this level decreases your risk of breast cancer and liver damage.  If you are concerned that you may have a problem, or your friends have told you they are concerned about your drinking, there are many resources to help. A well-known program that is free, effective, and available to all people all over the nation is Alcoholics Anonymous.  Check out this site to learn more: https://www.aa.org/   CALCIUM AND VITAMIN D:  Adequate intake of calcium and Vitamin D are recommended for bone health.  You should be getting between 1000-1200 mg of calcium and 800 units of Vitamin D daily between diet and supplements  PAP SMEARS:  Pap smears, to check for cervical cancer or precancers,  have traditionally been  done yearly, scientific advances have shown that most women can have pap smears less often.  However, every woman still should have a physical exam from her gynecologist every year. It will include a breast check, inspection of the vulva and vagina to check for abnormal growths or skin changes, a visual exam of the cervix, and then an exam to evaluate the size and shape of the uterus and ovaries. We will also provide age appropriate advice regarding health maintenance, like when you should have certain vaccines, screening for sexually transmitted diseases, bone density testing, colonoscopy, mammograms, etc.   MAMMOGRAMS:  All women over 40 years old should have a routine mammogram.   COLON CANCER SCREENING: Now recommend starting at age 45. At this time colonoscopy is not covered for routine screening until 50. There are take home tests that can be done between 45-49.   COLONOSCOPY:  Colonoscopy to screen for colon cancer is recommended for all women at age 50.  We know, you hate the idea of the prep.  We agree, BUT, having colon cancer and not knowing it is worse!!  Colon cancer so often starts as a polyp that can be seen and removed at colonscopy, which can quite literally save your life!  And if your first colonoscopy is normal and you have no family history of colon cancer, most women don't have to have it again for 10 years.  Once every ten years, you can do something that may end up saving your life, right?  We will be happy to help you get it scheduled when you are ready.  Be sure to check your insurance coverage so you understand how much it will cost.  It may be covered as a preventative service at no cost, but you should check   your particular policy.      Breast Self-Awareness Breast self-awareness means being familiar with how your breasts look and feel. It involves checking your breasts regularly and reporting any changes to your health care provider. Practicing breast self-awareness is  important. A change in your breasts can be a sign of a serious medical problem. Being familiar with how your breasts look and feel allows you to find any problems early, when treatment is more likely to be successful. All women should practice breast self-awareness, including women who have had breast implants. How to do a breast self-exam One way to learn what is normal for your breasts and whether your breasts are changing is to do a breast self-exam. To do a breast self-exam: Look for Changes  1. Remove all the clothing above your waist. 2. Stand in front of a mirror in a room with good lighting. 3. Put your hands on your hips. 4. Push your hands firmly downward. 5. Compare your breasts in the mirror. Look for differences between them (asymmetry), such as: ? Differences in shape. ? Differences in size. ? Puckers, dips, and bumps in one breast and not the other. 6. Look at each breast for changes in your skin, such as: ? Redness. ? Scaly areas. 7. Look for changes in your nipples, such as: ? Discharge. ? Bleeding. ? Dimpling. ? Redness. ? A change in position. Feel for Changes Carefully feel your breasts for lumps and changes. It is best to do this while lying on your back on the floor and again while sitting or standing in the shower or tub with soapy water on your skin. Feel each breast in the following way:  Place the arm on the side of the breast you are examining above your head.  Feel your breast with the other hand.  Start in the nipple area and make  inch (2 cm) overlapping circles to feel your breast. Use the pads of your three middle fingers to do this. Apply light pressure, then medium pressure, then firm pressure. The light pressure will allow you to feel the tissue closest to the skin. The medium pressure will allow you to feel the tissue that is a little deeper. The firm pressure will allow you to feel the tissue close to the ribs.  Continue the overlapping circles,  moving downward over the breast until you feel your ribs below your breast.  Move one finger-width toward the center of the body. Continue to use the  inch (2 cm) overlapping circles to feel your breast as you move slowly up toward your collarbone.  Continue the up and down exam using all three pressures until you reach your armpit.  Write Down What You Find  Write down what is normal for each breast and any changes that you find. Keep a written record with breast changes or normal findings for each breast. By writing this information down, you do not need to depend only on memory for size, tenderness, or location. Write down where you are in your menstrual cycle, if you are still menstruating. If you are having trouble noticing differences in your breasts, do not get discouraged. With time you will become more familiar with the variations in your breasts and more comfortable with the exam. How often should I examine my breasts? Examine your breasts every month. If you are breastfeeding, the best time to examine your breasts is after a feeding or after using a breast pump. If you menstruate, the best time to   examine your breasts is 5-7 days after your period is over. During your period, your breasts are lumpier, and it may be more difficult to notice changes. When should I see my health care provider? See your health care provider if you notice:  A change in shape or size of your breasts or nipples.  A change in the skin of your breast or nipples, such as a reddened or scaly area.  Unusual discharge from your nipples.  A lump or thick area that was not there before.  Pain in your breasts.  Anything that concerns you.   Osteopenia  Osteopenia is a loss of thickness (density) inside the bones. Another name for osteopenia is low bone mass. Mild osteopenia is a normal part of aging. It is not a disease, and it does not cause symptoms. However, if you have osteopenia and continue to lose  bone mass, you could develop a condition that causes the bones to become thin and break more easily (osteoporosis). Osteoporosis can cause you to lose some height, have back pain, and have a stooped posture. Although osteopenia is not a disease, making changes to your lifestyle and diet can help to prevent osteopenia from developing into osteoporosis. What are the causes? Osteopenia is caused by loss of calcium in the bones. Bones are constantly changing. Old bone cells are continually being replaced with new bone cells. This process builds new bone. The mineral calcium is needed to build new bone and maintain bone density. Bone density is usually highest around age 26. After that, most people's bodies cannot replace all the bone they have lost with new bone. What increases the risk? You are more likely to develop this condition if:  You are older than age 68.  You are a woman who went through menopause early.  You have a long illness that keeps you in bed.  You do not get enough exercise.  You lack certain nutrients (malnutrition).  You have an overactive thyroid gland (hyperthyroidism).  You use products that contain nicotine or tobacco, such as cigarettes, e-cigarettes and chewing tobacco, or you drink a lot of alcohol.  You are taking medicines that weaken the bones, such as steroids. What are the signs or symptoms? This condition does not cause any symptoms. You may have a slightly higher risk for bone breaks (fractures), so getting fractures more easily than normal may be an indication of osteopenia. How is this diagnosed? This condition may be diagnosed based on an X-ray exam that measures bone density (dual-energy X-ray absorptiometry, or DEXA). This test can measure bone density in your hips, spine, and wrists. Osteopenia has no symptoms, so this condition is usually diagnosed after a routine bone density screening test is done for osteoporosis. This routine screening is usually  done for:  Women who are age 14 or older.  Men who are age 77 or older. If you have risk factors for osteopenia, you may have the screening test at an earlier age. How is this treated? Making dietary and lifestyle changes can lower your risk for osteoporosis. If you have severe osteopenia that is close to becoming osteoporosis, this condition can be treated with medicines and dietary supplements such as calcium and vitamin D. These supplements help to rebuild bone density. Follow these instructions at home: Eating and drinking Eat a diet that is high in calcium and vitamin D.  Calcium is found in dairy products, beans, salmon, and leafy green vegetables like spinach and broccoli.  Look for foods that  have vitamin D and calcium added to them (fortified foods), such as orange juice, cereal, and bread.   Lifestyle  Do 30 minutes or more of a weight-bearing exercise every day, such as walking, jogging, or playing a sport. These types of exercises strengthen the bones.  Do not use any products that contain nicotine or tobacco, such as cigarettes, e-cigarettes, and chewing tobacco. If you need help quitting, ask your health care provider.  Do not drink alcohol if: ? Your health care provider tells you not to drink. ? You are pregnant, may be pregnant, or are planning to become pregnant.  If you drink alcohol: ? Limit how much you use to:  0-1 drink a day for women.  0-2 drinks a day for men. ? Be aware of how much alcohol is in your drink. In the [place] one drink equals one 12 oz bottle of beer (355 mL), one 5 oz glass of wine (148 mL), or one 1 oz glass of hard liquor (44 mL). General instructions  Take over-the-counter and prescription medicines only as told by your health care provider. These include vitamins and supplements.  Take precautions at home to lower your risk of falling, such as: ? Keeping rooms well-lit and free of clutter, such as cords. ? Installing safety rails on  stairs. ? Using rubber mats in the bathroom or other areas that are often wet or slippery.  Keep all follow-up visits. This is important. Contact a health care provider if:  You have not had a bone density screening for osteoporosis and you are: ? A woman who is age 60 or older. ? A man who is age 54 or older.  You are a postmenopausal woman who has not had a bone density screening for osteoporosis.  You are older than age 66 and you want to know if you should have bone density screening for osteoporosis. Summary  Osteopenia is a loss of thickness (density) inside the bones. Another name for osteopenia is low bone mass.  Osteopenia is not a disease, but it may increase your risk for a condition that causes the bones to become thin and break more easily (osteoporosis).  You may be at risk for osteopenia if you are older than age 46 or if you are a woman who went through early menopause.  Osteopenia does not cause any symptoms, but it can be diagnosed with a bone density screening test.  Dietary and lifestyle changes are the first treatment for osteopenia. These may lower your risk for osteoporosis. This information is not intended to replace advice given to you by your health care provider. Make sure you discuss any questions you have with your health care provider. Document Revised: 11/13/2019 Document Reviewed: 11/13/2019 Elsevier Patient Education  [employer].

## 2020-09-01 ENCOUNTER — Encounter: Payer: Medicare HMO | Admitting: Physical Therapy

## 2020-09-08 ENCOUNTER — Ambulatory Visit: Payer: Medicare HMO | Admitting: Physical Therapy

## 2020-09-08 ENCOUNTER — Encounter: Payer: Self-pay | Admitting: Physical Therapy

## 2020-09-08 ENCOUNTER — Other Ambulatory Visit: Payer: Self-pay

## 2020-09-08 DIAGNOSIS — M6281 Muscle weakness (generalized): Secondary | ICD-10-CM | POA: Diagnosis not present

## 2020-09-08 DIAGNOSIS — M25552 Pain in left hip: Secondary | ICD-10-CM

## 2020-09-08 DIAGNOSIS — M25561 Pain in right knee: Secondary | ICD-10-CM | POA: Diagnosis not present

## 2020-09-08 DIAGNOSIS — M25551 Pain in right hip: Secondary | ICD-10-CM

## 2020-09-08 NOTE — Therapy (Signed)
[facility] [address] [place], [place], [address] Phone: [phone]   Fax:  [phone]  Physical Therapy Treatment  Patient Details  Name: Tina Sutton MRN: [id number] Date of Birth: 10-23-1950 Referring Provider (PT): Dr. Georgina Snell   Encounter Date: 09/08/2020   PT End of Session - 09/08/20 1002    Visit Number 2    Number of Visits 13    Date for PT Re-Evaluation 09/26/20    Authorization Type [employer] Medicare    PT Start Time [phone]    PT Stop Time 1012    PT Time Calculation (min) 39 min    Activity Tolerance Patient tolerated treatment well;Patient limited by pain    Behavior During Therapy [facility] for tasks assessed/performed           Past Medical History:  Diagnosis Date  . Depression   . Dysmenorrhea   . Fibroid   . Hyperlipemia   . Hyperlipidemia   . Hypertension   . Inverted nipple x several yrs   bilateral, pt states nipples have always been inverted  . Joint pain   . Obesity   . Sleep apnea   . SOB (shortness of breath)   . Vitamin D deficiency     Past Surgical History:  Procedure Laterality Date  . BLEPHAROPLASTY Bilateral 02/21/2008  . BREAST BIOPSY Right   . BREAST EXCISIONAL BIOPSY Right    benign  . PELVIC LAPAROSCOPY    . TOTAL ABDOMINAL HYSTERECTOMY  05/04/2001    There were no vitals filed for this visit.   Subjective Assessment - 09/08/20 0940    Subjective Pt states she has had R anterior hip pain with walking that is almost immediate. Her R S/L pain is still there when she lays onto her R side. She states the lunging stretch has been hurting the R knee but she has not been holding on. She states that she has had mixed compliance with HEP, that is why she canceled last week.    How long can you stand comfortably? >1hr    How long can you walk comfortably? >1hr    Diagnostic tests X-ray of hip and knee    Patient Stated Goals Return to activity without any pain    Currently in Pain?  No/denies    Multiple Pain Sites No                             [facility] Adult PT Treatment/Exercise - 09/08/20 0001      Ambulation/Gait   Gait Pattern Trendelenburg;Decreased stance time - right      Exercises   Exercises Knee/Hip      Knee/Hip Exercises: Stretches   Quad Stretch 30 seconds;3 reps    Quad Stretch Limitations prone with strap    Other Knee/Hip Stretches standing lunge stretch at stairs 30s 3x      Knee/Hip Exercises: Supine   Bridges 2 sets;10 reps      Knee/Hip Exercises: Sidelying   Hip ABduction 2 sets;10 reps    Hip ABduction Limitations cuing needed for hip extension      Manual Therapy   Manual Therapy Joint mobilization;Soft tissue mobilization    Joint Mobilization LAD grade II 3 min    Soft tissue mobilization R ant hip flexor group, TFL, rec fem                  PT Education - 09/08/20 7893  Education Details HEP update and compliance, sleeping position, posture, muscle firing, anatomy    Person(s) Educated Patient    Methods Explanation;Demonstration;Tactile cues;Handout;Verbal cues    Comprehension Verbalized understanding;Returned demonstration            PT Short Term Goals - 08/27/20 1202      PT SHORT TERM GOAL #1   Title Pt will become independent with HEP in order to demonstrate synthesis of PT education.    Time 2    Period Weeks    Status New      PT SHORT TERM GOAL #2   Title Pt will be able to demonstrate R S/L without pain in order to demonstrate functional improvement in R LE static positioning in order to promote proper sleep and recovery.    Time 3    Period Weeks    Status New             PT Long Term Goals - 08/27/20 1203      PT LONG TERM GOAL #1   Title Pt will be able to demonstrate reciprocal stair management pattern in order to demonstrate functional improvement in LE function for improve mobility for house hold duties.    Time 6    Period Weeks    Status New      PT LONG  TERM GOAL #2   Title Pt will be able to perform >10 STS without UE support in order to demonstrate functional improvement in R LE strength and tolerate to loaded activity.    Time 6    Period Weeks    Status New                 Plan - 09/08/20 1003    Clinical Impression Statement Pt presented with increased R hip stiffness at beginning of session that was relieved with STM and joint mobilization. Pt responded especially well to joint mob with decrease in anterior hip "catching" during gait. Due to mixed compliance with HEP, more advanced strengthening and progressive stretching exercises were held to promote better performance at home. HEP was reviewed and pt required cuing and edu re form and technique. Pt was given edu about the importance of HEP compliance in order to progress mobility and strengthening. Pt would benefit from continued skilled therapy in order to reach goals and maximize functional bilat UE strength and ROM for full return to PLOF.    Personal Factors and Comorbidities Fitness;Time since onset of injury/illness/exacerbation    Examination-Activity Limitations Sleep;Lift;Carry;Stairs;Squat    Examination-Participation Restrictions Community Activity;Other    Stability/Clinical Decision Making Stable/Uncomplicated    Rehab Potential Good    PT Frequency 2x / week    PT Duration 6 weeks    PT Treatment/Interventions ADLs/Self Care Home Management;Cryotherapy;Electrical Stimulation;Iontophoresis 4mg /ml Dexamethasone;Moist Heat;Biofeedback;Traction;Ultrasound;Gait training;Stair training;Functional mobility training;Therapeutic activities;Therapeutic exercise;Balance training;Neuromuscular re-education;Patient/family education;Orthotic Fit/Training;Manual techniques;Passive range of motion;Dry needling;Taping;Spinal Manipulations;Joint Manipulations    PT Next Visit Plan review HEP, trial stand hip ABD, lateral hip mob, scoop inf hip mob, LAQ    PT Home Exercise Plan  [email or web address]    Consulted and Agree with Plan of Care Patient           Patient will benefit from skilled therapeutic intervention in order to improve the following deficits and impairments:  Abnormal gait,Pain,Hypomobility,Decreased activity tolerance,Decreased strength,Decreased balance,Decreased mobility,Decreased range of motion,Impaired flexibility  Visit Diagnosis: Right hip pain  Left hip pain  Pain in joint of right knee  Muscle weakness     Problem  List Patient Active Problem List   Diagnosis Date Noted  . Allergic rhinitis 08/30/2020  . Chronic kidney disease due to hypertension 08/30/2020  . Type 2 diabetes mellitus with other specified complication ([place]) 43/20/0379  . History of malignant neoplasm of skin 08/30/2020  . Hyperlipidemia 08/30/2020  . Malignant melanoma of face ([place]) 08/30/2020  . Morbid obesity ([place]) 08/30/2020  . Osteopenia 08/30/2020  . Personal history of colonic polyps 08/30/2020  . Vitamin D deficiency 08/30/2020  . Essential hypertension 03/03/2020    Daleen Bo PT, DPT 09/08/20 1:04 PM   [facility] [address] [place], [place], [address] Phone: [phone]   Fax:  [phone]  Name: Tina Sutton MRN: [id number] Date of Birth: November 16, 1950

## 2020-09-08 NOTE — Patient Instructions (Signed)
Access Code: [email or web address] URL: [email or web address] Date: 09/08/2020 Prepared by: Daleen Bo  Exercises Side Lunge Adductor Stretch - 2 x daily - 7 x weekly - 1 sets - 3 reps - 30 hold Prone Quadriceps Stretch with Strap - 2 x daily - 7 x weekly - 1 sets - 3 reps - 30 hold Sidelying Hip Abduction - 2 x daily - 7 x weekly - 2 sets - 10 reps Supine Bridge - 1 x daily - 7 x weekly - 2 sets - 10 reps

## 2020-09-15 ENCOUNTER — Encounter: Payer: Medicare HMO | Admitting: Physical Therapy

## 2020-09-20 ENCOUNTER — Encounter (INDEPENDENT_AMBULATORY_CARE_PROVIDER_SITE_OTHER): Payer: Self-pay | Admitting: Family Medicine

## 2020-09-20 ENCOUNTER — Ambulatory Visit (INDEPENDENT_AMBULATORY_CARE_PROVIDER_SITE_OTHER): Payer: Medicare HMO | Admitting: Family Medicine

## 2020-09-20 ENCOUNTER — Other Ambulatory Visit: Payer: Self-pay

## 2020-09-20 VITALS — BP 115/66 | HR 72 | Temp 97.7°F | Ht 64.0 in | Wt 183.0 lb

## 2020-09-20 DIAGNOSIS — Z6838 Body mass index (BMI) 38.0-38.9, adult: Secondary | ICD-10-CM | POA: Diagnosis not present

## 2020-09-20 DIAGNOSIS — E860 Dehydration: Secondary | ICD-10-CM | POA: Diagnosis not present

## 2020-09-20 DIAGNOSIS — E1169 Type 2 diabetes mellitus with other specified complication: Secondary | ICD-10-CM

## 2020-09-22 ENCOUNTER — Other Ambulatory Visit: Payer: Self-pay

## 2020-09-22 ENCOUNTER — Ambulatory Visit: Payer: Medicare HMO | Admitting: Physical Therapy

## 2020-09-22 ENCOUNTER — Encounter: Payer: Self-pay | Admitting: Physical Therapy

## 2020-09-22 DIAGNOSIS — M25552 Pain in left hip: Secondary | ICD-10-CM | POA: Diagnosis not present

## 2020-09-22 DIAGNOSIS — R262 Difficulty in walking, not elsewhere classified: Secondary | ICD-10-CM | POA: Diagnosis not present

## 2020-09-22 DIAGNOSIS — M25551 Pain in right hip: Secondary | ICD-10-CM | POA: Diagnosis not present

## 2020-09-22 DIAGNOSIS — M6281 Muscle weakness (generalized): Secondary | ICD-10-CM | POA: Diagnosis not present

## 2020-09-22 DIAGNOSIS — M25561 Pain in right knee: Secondary | ICD-10-CM | POA: Diagnosis not present

## 2020-09-22 NOTE — Therapy (Signed)
[facility] [address] [place], [place], [address] Phone: [phone]   Fax:  [phone]  Physical Therapy Treatment  Patient Details  Name: Tina Sutton MRN: [id number] Date of Birth: [birth year] Referring Provider (PT): Dr. Georgina Snell   Encounter Date: 09/22/2020   PT End of Session - 09/22/20 0929    Visit Number 3    Number of Visits 13    Date for PT Re-Evaluation 09/26/20    Authorization Type [employer]na Medicare    PT Start Time [phone]    PT Stop Time 0929    PT Time Calculation (min) 40 min    Activity Tolerance Patient tolerated treatment well;Patient limited by pain    Behavior During Therapy [facility] for tasks assessed/performed           Past Medical History:  Diagnosis Date  . Depression   . Dysmenorrhea   . Fibroid   . Hyperlipemia   . Hyperlipidemia   . Hypertension   . Inverted nipple x several yrs   bilateral, pt states nipples have always been inverted  . Joint pain   . Obesity   . Sleep apnea   . SOB (shortness of breath)   . Vitamin D deficiency     Past Surgical History:  Procedure Laterality Date  . BLEPHAROPLASTY Bilateral 02/21/2008  . BREAST BIOPSY Right   . BREAST EXCISIONAL BIOPSY Right    benign  . PELVIC LAPAROSCOPY    . TOTAL ABDOMINAL HYSTERECTOMY  05/04/2001    There were no vitals filed for this visit.   Subjective Assessment - 09/22/20 0854    Subjective Pt she states she had some increased R hip pain over the weekend while sitting at her desk. She was in meetings all day and did things around the home. No new sort of physical activity or change. She states the prone quad stretch is not as intense anymore. She reports better compliance with HEP but it is not totally able to complete as prescribed.    How long can you stand comfortably? >1hr    How long can you walk comfortably? >1hr    Diagnostic tests X-ray of hip and knee    Patient Stated Goals Return to activity without any pain     Currently in Pain? Yes    Pain Score 1     Pain Location Hip    Pain Orientation Right    Pain Descriptors / Indicators Aching    Aggravating Factors  sitting, standing    Pain Relieving Factors resting, meds                             [facility] Adult PT Treatment/Exercise - 09/22/20 0001      Ambulation/Gait   Gait Pattern Trendelenburg;Decreased stance time - right ; standing R knee valgus posture     Exercises   Exercises Knee/Hip      Knee/Hip Exercises: Stretches   Sports administrator 30 seconds;3 reps    Quad Stretch Limitations prone with strap, towel under thigh   Other Knee/Hip Stretches standing lunge stretch at table, with hip flexion and extension 30s 3x      Knee/Hip Exercises: Seated   Long Arc Quad 2 sets;10 reps    Long Arc Quad Weight 2 lbs.      Knee/Hip Exercises: Supine   Bridges 2 sets;10 reps    Bridges Limitations red band at knees      Knee/Hip  Exercises: Sidelying   Hip ABduction 2 sets;10 reps    Hip ABduction Limitations cuing needed for hip extension      Manual Therapy   Manual Therapy Joint mobilization;Soft tissue mobilization    Joint Mobilization Mulligan belt inf and lateral grade IV R hip mob    Soft tissue mobilization --                  PT Education - 09/22/20 0929    Education Details HEP, POC, posture, muscle firing, anatomy, exercise progression    Person(s) Educated Patient    Methods Explanation;Demonstration;Tactile cues;Verbal cues;Handout    Comprehension Verbalized understanding;Returned demonstration;Verbal cues required;Tactile cues required            PT Short Term Goals - 08/27/20 1202      PT SHORT TERM GOAL #1   Title Pt will become independent with HEP in order to demonstrate synthesis of PT education.    Time 2    Period Weeks    Status New      PT SHORT TERM GOAL #2   Title Pt will be able to demonstrate R S/L without pain in order to demonstrate functional improvement in R LE static  positioning in order to promote proper sleep and recovery.    Time 3    Period Weeks    Status New             PT Long Term Goals - 08/27/20 1203      PT LONG TERM GOAL #1   Title Pt will be able to demonstrate reciprocal stair management pattern in order to demonstrate functional improvement in LE function for improve mobility for house hold duties.    Time 6    Period Weeks    Status New      PT LONG TERM GOAL #2   Title Pt will be able to perform >10 STS without UE support in order to demonstrate functional improvement in R LE strength and tolerate to loaded activity.    Time 6    Period Weeks    Status New                 Plan - 09/22/20 0930    Clinical Impression Statement Pt had abolishment of hip pain following grade IV hip mobilization. Pt had improved hip IR ROM following. HEP was reviewed and modified due to pt's reported compliance with exercise program. Pt does demonstrate improvement with posterior chain muscle endurance though does continue to show strenght deficits with added external resistance. Pt was able to progress HEP strengthening. Plan to incorporate quad strengthening at next visit, if pt reports no issues with current HEP. Pt would benefit from continued skilled therapy in order to reach goals and maximize functional R LE strength and ROM for full return to PLOF.    Personal Factors and Comorbidities Fitness;Time since onset of injury/illness/exacerbation    Examination-Activity Limitations Sleep;Lift;Carry;Stairs;Squat    Examination-Participation Restrictions Community Activity;Other    Stability/Clinical Decision Making Stable/Uncomplicated    Rehab Potential Good    PT Frequency 2x / week    PT Duration 6 weeks    PT Treatment/Interventions ADLs/Self Care Home Management;Cryotherapy;Electrical Stimulation;Iontophoresis 4mg /ml Dexamethasone;Moist Heat;Biofeedback;Traction;Ultrasound;Gait training;Stair training;Functional mobility  training;Therapeutic activities;Therapeutic exercise;Balance training;Neuromuscular re-education;Patient/family education;Orthotic Fit/Training;Manual techniques;Passive range of motion;Dry needling;Taping;Spinal Manipulations;Joint Manipulations    PT Next Visit Plan review HEP, LAQ, step up, prone HS curl    PT Home Exercise Plan [email or web address]    Consulted and Agree with  Plan of Care Patient           Patient will benefit from skilled therapeutic intervention in order to improve the following deficits and impairments:  Abnormal gait,Pain,Hypomobility,Decreased activity tolerance,Decreased strength,Decreased balance,Decreased mobility,Decreased range of motion,Impaired flexibility  Visit Diagnosis: Right hip pain  Left hip pain  Pain in joint of right knee  Muscle weakness (generalized)  Difficulty walking     Problem List Patient Active Problem List   Diagnosis Date Noted  . Allergic rhinitis 08/30/2020  . Chronic kidney disease due to hypertension 08/30/2020  . Type 2 diabetes mellitus with other specified complication ([place]) 48/06/6551  . History of malignant neoplasm of skin 08/30/2020  . Hyperlipidemia 08/30/2020  . Malignant melanoma of face ([place]) 08/30/2020  . Morbid obesity ([place]) 08/30/2020  . Osteopenia 08/30/2020  . Personal history of colonic polyps 08/30/2020  . Vitamin D deficiency 08/30/2020  . Essential hypertension 03/03/2020    Daleen Bo PT, DPT 09/22/20 9:44 AM   [facility] [address] [place], [place], [address] Phone: [phone]   Fax:  [phone]  Name: Tina Sutton MRN: [id number] Date of Birth: 1951/04/08

## 2020-09-29 ENCOUNTER — Other Ambulatory Visit: Payer: Self-pay

## 2020-09-29 ENCOUNTER — Encounter: Payer: Self-pay | Admitting: Physical Therapy

## 2020-09-29 ENCOUNTER — Ambulatory Visit: Payer: Medicare HMO | Admitting: Physical Therapy

## 2020-09-29 DIAGNOSIS — R262 Difficulty in walking, not elsewhere classified: Secondary | ICD-10-CM | POA: Diagnosis not present

## 2020-09-29 DIAGNOSIS — M6281 Muscle weakness (generalized): Secondary | ICD-10-CM

## 2020-09-29 DIAGNOSIS — M25552 Pain in left hip: Secondary | ICD-10-CM

## 2020-09-29 DIAGNOSIS — M25551 Pain in right hip: Secondary | ICD-10-CM

## 2020-09-29 DIAGNOSIS — M25561 Pain in right knee: Secondary | ICD-10-CM | POA: Diagnosis not present

## 2020-09-29 NOTE — Therapy (Signed)
[facility] [address] [place], [place], [address] Phone: [phone]   Fax:  [phone]  Physical Therapy Treatment  Patient Details  Name: Tina Sutton MRN: [id number] Date of Birth: 09/15/1950 Referring Provider (PT): Dr. Georgina Snell   Encounter Date: 09/29/2020   PT End of Session - 09/29/20 0834    Visit Number 4    Number of Visits 13    Date for PT Re-Evaluation 10/29/20    Authorization Type Aetna Medicare    PT Start Time 0845    PT Stop Time 0930    PT Time Calculation (min) 45 min    Activity Tolerance Patient tolerated treatment well;Patient limited by pain    Behavior During Therapy [facility] for tasks assessed/performed           Past Medical History:  Diagnosis Date  . Depression   . Dysmenorrhea   . Fibroid   . Hyperlipemia   . Hyperlipidemia   . Hypertension   . Inverted nipple x several yrs   bilateral, pt states nipples have always been inverted  . Joint pain   . Obesity   . Sleep apnea   . SOB (shortness of breath)   . Vitamin D deficiency     Past Surgical History:  Procedure Laterality Date  . BLEPHAROPLASTY Bilateral 02/21/2008  . BREAST BIOPSY Right   . BREAST EXCISIONAL BIOPSY Right    benign  . PELVIC LAPAROSCOPY    . TOTAL ABDOMINAL HYSTERECTOMY  05/04/2001    There were no vitals filed for this visit.   Subjective Assessment - 09/29/20 0847    Subjective Pt states that crossing the R leg to put on socks is painful. She states that motion has continually been getting worse. She states she has done an "exceptionally poor job" of keeping up with her HEP recently.    How long can you stand comfortably? >1hr    How long can you walk comfortably? >1hr    Diagnostic tests X-ray of hip and knee    Patient Stated Goals Return to activity without any pain    Currently in Pain? Yes    Pain Score 1     Pain Location Hip    Pain Orientation Right    Pain Descriptors / Indicators Nagging;Radiating     Aggravating Factors  crossing leg, sitting              [facility] PT Assessment - 09/29/20 0001      Assessment   Medical Diagnosis Bil hip and R knee pain    Referring Provider (PT) Dr. Georgina Snell      Precautions   Precautions None      Restrictions   Weight Bearing Restrictions No      Balance Screen   Has the patient fallen in the past 6 months No      [place] residence      Prior Function   Level of Independence Independent      Cognition   Overall Cognitive Status Within Functional Limits for tasks assessed      Observation/Other Assessments   Other Surveys  Lower Extremity Functional Scale    Lower Extremity Functional Scale  77.5% function      Functional Tests   Functional tests Sit to Stand      Sit to Stand   Comments L> R WB, no UE support needed from thigh height surface      AROM  Overall AROM Comments R hip limited to ~60% in flexion, ADD, ER, and IR;   p! into latreal hip with IR on R     PROM   Overall PROM  Deficits    Overall PROM Comments L WFL, R most limited in IR, mod limited in other directions      Strength   Overall Strength Comments 4+/5 bilat through hips, bilat knee 4+/5 flexion and ext      Flexibility   Soft Tissue Assessment /Muscle Length yes    Hamstrings Decreased bilaterally    Quadriceps improved in Ely's, 3 inches from heel to bottom with OP    ITB --      Palpation   SI assessment  --    Palpation comment TTP of R lateral hip and quads      Anterior Hip Impingement Test    Findings --    Side  --                         [facility] Adult PT Treatment/Exercise - 09/29/20 0001      Ambulation/Gait   Gait Pattern Trendelenburg;Decreased stance time - right      Exercises   Exercises Knee/Hip      Knee/Hip Exercises: Stretches   Sports administrator 30 seconds;3 reps    Quad Stretch Limitations prone with strap    Other Knee/Hip Stretches standing lunge stretch at stairs  30s 3x    Other Knee/Hip Stretches modified thomas EOB 30s 2x      Knee/Hip Exercises: Seated   Long Arc Quad 2 sets;10 reps    Long Arc Quad Weight 5 lbs.      Knee/Hip Exercises: Supine   Bridges 2 sets;10 reps    Bridges Limitations --    Other Supine Knee/Hip Exercises SLR 1lb 3x5      Knee/Hip Exercises: Sidelying   Hip ABduction 2 sets;10 reps    Hip ABduction Limitations 1lb      Manual Therapy   Manual Therapy Joint mobilization;Soft tissue mobilization    Joint Mobilization Mulligan belt inf and lateral grade IV R hip mob                  PT Education - 09/29/20 0907    Education Details HEP, POC, posture, muscle firing, anatomy, exercise progression, compliance/consistency of HEP    Person(s) Educated Patient    Methods Explanation;Demonstration;Tactile cues;Verbal cues    Comprehension Verbalized understanding;Returned demonstration;Verbal cues required;Tactile cues required            PT Short Term Goals - 09/29/20 1212      PT SHORT TERM GOAL #1   Title Pt will become independent with HEP in order to demonstrate synthesis of PT education.    Time 2    Period Weeks    Status On-going      PT SHORT TERM GOAL #2   Title Pt will be able to demonstrate R S/L without pain in order to demonstrate functional improvement in R LE static positioning in order to promote proper sleep and recovery.    Time 3    Period Weeks    Status On-going             PT Long Term Goals - 09/29/20 1212      PT LONG TERM GOAL #1   Title Pt will be able to demonstrate reciprocal stair management pattern in order to demonstrate functional improvement in LE function  for improve mobility for house hold duties.    Time 6    Period Weeks    Status On-going      PT LONG TERM GOAL #2   Title Pt will be able to perform >10 STS without UE support in order to demonstrate functional improvement in R LE strength and tolerate to loaded activity.    Time 6    Period Weeks     Status On-going                 Plan - 09/29/20 1210    Clinical Impression Statement Pt demonstrates slight improvement with strength and function as demosntrated by objective measure and outcome measure. Pt does also have some improvement in irritability and intensity of sx. However, due to decreased compliance with HEP, progress is not as expected at this point. Pt is aware of need for HEP and consistency of exercise in order to improve joint mobility, flexibility, and strength. Pt's HEP unmodified at this point and frequency was verbally updated to promote better follow through at home. Pt would benefit from continued skilled therapy in order to reach goals and maximize functional R LE strength and ROM for full return to PLOF.    Personal Factors and Comorbidities Fitness;Time since onset of injury/illness/exacerbation    Examination-Activity Limitations Sleep;Lift;Carry;Stairs;Squat    Examination-Participation Restrictions Community Activity;Other    Stability/Clinical Decision Making Stable/Uncomplicated    Rehab Potential Good    PT Frequency 2x / week    PT Duration 6 weeks    PT Treatment/Interventions ADLs/Self Care Home Management;Cryotherapy;Electrical Stimulation;Iontophoresis 4mg /ml Dexamethasone;Moist Heat;Biofeedback;Traction;Ultrasound;Gait training;Stair training;Functional mobility training;Therapeutic activities;Therapeutic exercise;Balance training;Neuromuscular re-education;Patient/family education;Orthotic Fit/Training;Manual techniques;Passive range of motion;Dry needling;Taping;Spinal Manipulations;Joint Manipulations    PT Next Visit Plan review HEP, LAQ with 10 lbs, step up, prone HS curl    PT Home Exercise Plan [email or web address]    Consulted and Agree with Plan of Care Patient           Patient will benefit from skilled therapeutic intervention in order to improve the following deficits and impairments:  Abnormal gait,Pain,Hypomobility,Decreased activity  tolerance,Decreased strength,Decreased balance,Decreased mobility,Decreased range of motion,Impaired flexibility  Visit Diagnosis: Right hip pain  Left hip pain  Pain in joint of right knee  Muscle weakness (generalized)  Difficulty walking     Problem List Patient Active Problem List   Diagnosis Date Noted  . Allergic rhinitis 08/30/2020  . Chronic kidney disease due to hypertension 08/30/2020  . Type 2 diabetes mellitus with other specified complication ([place]) 07/37/1062  . History of malignant neoplasm of skin 08/30/2020  . Hyperlipidemia 08/30/2020  . Malignant melanoma of face ([place]) 08/30/2020  . Morbid obesity ([place]) 08/30/2020  . Osteopenia 08/30/2020  . Personal history of colonic polyps 08/30/2020  . Vitamin D deficiency 08/30/2020  . Essential hypertension 03/03/2020   Daleen Bo PT, DPT 09/29/20 1:25 PM   [facility] [address] [place], [place], [address] Phone: [phone]   Fax:  [phone]  Name: Jelena Malicoat MRN: [id number] Date of Birth: 03-15-1951

## 2020-09-29 NOTE — Progress Notes (Signed)
Chief Complaint:   OBESITY Isola is here to discuss her progress with her obesity treatment plan along with follow-up of her obesity related diagnoses. Valerye is on the Category 2 Plan + 100 calories and states she is following her eating plan approximately 90% of the time. Camylle states she is doing 0 minutes 0 times per week.  Today's visit was #: 12 Starting weight: 226 lbs Starting date: 12/06/2019 Today's weight: 183 lbs Today's date: 09/20/2020 Total lbs lost to date: 43 Total lbs lost since last in-office visit: 2  Interim History: Kasara continues to do well with weight loss. She is still following her plan but sometimes adds some extra fats but does well with portion control. She started physical therapy for her hip pain recently.  Subjective:   1. Type 2 diabetes mellitus with other specified complication, without long-term current use of insulin (HCC) Aryianna is doing very well with diet. She is not on medications and she denies feeling hypoglycemic. Her last A1c was controlled at 5.6.  2. Dehydration Jazmina has had a couple of episodes of feeling lightheaded with positive changes. She is trying to increase her water intake.  Assessment/Plan:   1. Type 2 diabetes mellitus with other specified complication, without long-term current use of insulin (HCC) Good blood sugar control is important to decrease the likelihood of diabetic complications such as nephropathy, neuropathy, limb loss, blindness, coronary artery disease, and death. Intensive lifestyle modification including diet, exercise and weight loss are the first line of treatment for diabetes. Antwan will continue diet and exercise, and will recheck labs in 1 month.  2. Dehydration Marvetta will continue to increase her water intake as dehydration is very common with weight loss.  3. Obesity with current BMI of 31.5 Abegail is currently in the action stage of change. As such, her goal is to continue with  weight loss efforts. She has agreed to the Category 2 Plan.   We will recheck fasting labs at her next visit.  Behavioral modification strategies: increasing water intake.  Aryssa has agreed to follow-up with our clinic in 4 weeks. She was informed of the importance of frequent follow-up visits to maximize her success with intensive lifestyle modifications for her multiple health conditions.   Objective:   Blood pressure 115/66, pulse 72, temperature 97.7 F (36.5 C), height 5\' 4"  (1.626 m), weight 183 lb (83 kg), last menstrual period 06/12/2000, SpO2 98 %. Body mass index is 31.41 kg/m.  General: Cooperative, alert, well developed, in no acute distress. HEENT: Conjunctivae and lids unremarkable. Cardiovascular: Regular rhythm.  Lungs: Normal work of breathing. Neurologic: No focal deficits.   Lab Results  Component Value Date   CREATININE 0.72 06/22/2020   BUN 20 06/22/2020   NA 140 06/22/2020   K 4.2 06/22/2020   CL 102 06/22/2020   CO2 23 06/22/2020   Lab Results  Component Value Date   ALT 21 06/22/2020   AST 19 06/22/2020   ALKPHOS 61 06/22/2020   BILITOT 0.4 06/22/2020   Lab Results  Component Value Date   HGBA1C 5.6 06/22/2020   HGBA1C 5.8 (H) 03/30/2020   HGBA1C 6.5 10/31/2019   Lab Results  Component Value Date   INSULIN 17.6 06/22/2020   INSULIN 13.9 03/30/2020   INSULIN 24.9 12/09/2019   Lab Results  Component Value Date   TSH 2.260 06/22/2020   Lab Results  Component Value Date   CHOL 194 06/22/2020   HDL 64 06/22/2020   LDLCALC 113 (  H) 06/22/2020   TRIG 94 06/22/2020   Lab Results  Component Value Date   WBC 8.4 07/30/2020   HGB 13.1 07/30/2020   HCT 38.8 07/30/2020   MCV 89.6 07/30/2020   PLT 178 07/30/2020   No results found for: IRON, TIBC, FERRITIN   Attestation Statements:   Reviewed by clinician on day of visit: allergies, medications, problem list, medical history, surgical history, family history, social history, and  previous encounter notes.  Time spent on visit including pre-visit chart review and post-visit care and charting was 32 minutes.    I, Trixie Dredge, am acting as transcriptionist for [name], MD.  I have reviewed the above documentation for accuracy and completeness, and I agree with the above. -  [name], MD

## 2020-09-29 NOTE — Patient Instructions (Signed)
Access Code: [email or web address] URL: [email or web address] Date: 09/29/2020 Prepared by: Daleen Bo  Exercises Side Lunge Adductor Stretch - 2 x daily - 7 x weekly - 1 sets - 3 reps - 30 hold Sidelying Hip Abduction - 2 x daily - 7 x weekly - 2 sets - 10 reps Prone Hip Flexor Stretch on Table with Strap - 2 x daily - 7 x weekly - 1 sets - 3 reps - 30 hold Supine Bridge with Resistance Band - 1 x daily - 7 x weekly - 2 sets - 10 reps Side Stepping with Resistance at Thighs - 1 x daily - 7 x weekly - 1 sets - 1 reps - 47ft hold

## 2020-10-04 ENCOUNTER — Ambulatory Visit: Payer: Medicare HMO | Admitting: Family Medicine

## 2020-10-06 ENCOUNTER — Encounter: Payer: Self-pay | Admitting: Physical Therapy

## 2020-10-06 ENCOUNTER — Other Ambulatory Visit: Payer: Self-pay

## 2020-10-06 ENCOUNTER — Ambulatory Visit: Payer: Medicare HMO | Admitting: Physical Therapy

## 2020-10-06 DIAGNOSIS — M6281 Muscle weakness (generalized): Secondary | ICD-10-CM | POA: Diagnosis not present

## 2020-10-06 DIAGNOSIS — M25561 Pain in right knee: Secondary | ICD-10-CM | POA: Diagnosis not present

## 2020-10-06 DIAGNOSIS — L570 Actinic keratosis: Secondary | ICD-10-CM | POA: Diagnosis not present

## 2020-10-06 DIAGNOSIS — D1801 Hemangioma of skin and subcutaneous tissue: Secondary | ICD-10-CM | POA: Diagnosis not present

## 2020-10-06 DIAGNOSIS — L821 Other seborrheic keratosis: Secondary | ICD-10-CM | POA: Diagnosis not present

## 2020-10-06 DIAGNOSIS — L814 Other melanin hyperpigmentation: Secondary | ICD-10-CM | POA: Diagnosis not present

## 2020-10-06 DIAGNOSIS — D692 Other nonthrombocytopenic purpura: Secondary | ICD-10-CM | POA: Diagnosis not present

## 2020-10-06 DIAGNOSIS — D3617 Benign neoplasm of peripheral nerves and autonomic nervous system of trunk, unspecified: Secondary | ICD-10-CM | POA: Diagnosis not present

## 2020-10-06 DIAGNOSIS — Z85828 Personal history of other malignant neoplasm of skin: Secondary | ICD-10-CM | POA: Diagnosis not present

## 2020-10-06 DIAGNOSIS — R262 Difficulty in walking, not elsewhere classified: Secondary | ICD-10-CM | POA: Diagnosis not present

## 2020-10-06 DIAGNOSIS — M25551 Pain in right hip: Secondary | ICD-10-CM

## 2020-10-06 DIAGNOSIS — M25552 Pain in left hip: Secondary | ICD-10-CM

## 2020-10-06 NOTE — Therapy (Signed)
[facility] [address] [place], [place], [address] Phone: [phone]   Fax:  [phone]  Physical Therapy Treatment  Patient Details  Name: Tina [place] MRN: [id number] Date of Birth: November 15, 1950 Referring Provider (PT): Dr. Georgina Snell   Encounter Date: 10/06/2020   PT End of Session - 10/06/20 0943    Visit Number 5    Number of Visits 13    Date for PT Re-Evaluation 10/29/20    Authorization Type [employer]    PT Start Time 0845    PT Stop Time 0924    PT Time Calculation (min) 39 min    Activity Tolerance Patient tolerated treatment well;Patient limited by pain    Behavior During Therapy [facility] for tasks assessed/performed           Past Medical History:  Diagnosis Date  . Depression   . Dysmenorrhea   . Fibroid   . Hyperlipemia   . Hyperlipidemia   . Hypertension   . Inverted nipple x several yrs   bilateral, pt states nipples have always been inverted  . Joint pain   . Obesity   . Sleep apnea   . SOB (shortness of breath)   . Vitamin D deficiency     Past Surgical History:  Procedure Laterality Date  . BLEPHAROPLASTY Bilateral 02/21/2008  . BREAST BIOPSY Right   . BREAST EXCISIONAL BIOPSY Right    benign  . PELVIC LAPAROSCOPY    . TOTAL ABDOMINAL HYSTERECTOMY  05/04/2001    There were no vitals filed for this visit.   Subjective Assessment - 10/06/20 0850    Subjective Pt states that the pain "might be a little better." She states she has been diligent about her exercises this past week. She notices that doing hip ABD and sidesteps is difficult bc the hip muscle are tired.    How long can you stand comfortably? >1hr    How long can you walk comfortably? >1hr    Diagnostic tests X-ray of hip and knee    Patient Stated Goals Return to activity without any pain    Currently in Pain? No/denies    Pain Score 0-No pain                             [facility] Adult PT Treatment/Exercise -  10/06/20 0001      Ambulation/Gait   Gait Pattern Trendelenburg;Decreased stance time - right      Exercises   Exercises Knee/Hip      Knee/Hip Exercises: Stretches           Other Knee/Hip Stretches standing lunge stretch at stairs, upright and with hip flexion 30s 3x          Knee/Hip Exercises: Standing   Forward Step Up Limitations 2x10 double UE support      Knee/Hip Exercises: Seated   Long Arc Quad 10 reps;3 sets    [employer] Weight 5 lbs.      Knee/Hip Exercises: Supine   Bridges 10 reps    Bridges Limitations red band at knees    Other Supine Knee/Hip Exercises SLR 2x10 2lbs      Knee/Hip Exercises: Sidelying   Hip ABduction 2 sets;10 reps          Manual Therapy   Manual Therapy Joint mobilization;   Joint Mobilization Mulligan belt inf and lateral grade IV R hip mob & IR/ER bias  PT Education - 10/06/20 0942    Education Details HEP modifications,  joint protection, muscle firing, anatomy, exercise progression,    Person(s) Educated Patient    Methods Explanation;Demonstration;Verbal cues;Tactile cues;Handout    Comprehension Verbalized understanding;Returned demonstration;Verbal cues required;Tactile cues required            PT Short Term Goals - 09/29/20 1212      PT SHORT TERM GOAL #1   Title Pt will become independent with HEP in order to demonstrate synthesis of PT education.    Time 2    Period Weeks    Status On-going      PT SHORT TERM GOAL #2   Title Pt will be able to demonstrate R S/L without pain in order to demonstrate functional improvement in R LE static positioning in order to promote proper sleep and recovery.    Time 3    Period Weeks    Status On-going             PT Long Term Goals - 09/29/20 1212      PT LONG TERM GOAL #1   Title Pt will be able to demonstrate reciprocal stair management pattern in order to demonstrate functional improvement in LE function for improve mobility for house hold  duties.    Time 6    Period Weeks    Status On-going      PT LONG TERM GOAL #2   Title Pt will be able to perform >10 STS without UE support in order to demonstrate functional improvement in R LE strength and tolerate to loaded activity.    Time 6    Period Weeks    Status On-going                 Plan - 10/06/20 0943    Clinical Impression Statement Pt demonstrates improvement with hip ER ROM following joint mobilization, though sustained seated fig 4 position begins to cause joint pain. Pt was able to progress R LE strengthening in CKC without increased pain. Pt with decreased R quadriceps and hip flexor endurance at this time that is likely due to history of chronic knee pain. Plan to progress CKC exercise as tolerated. Pt would benefit from continued skilled therapy in order to reach goals and maximize functional R LE strength and ROM for full return regular participation/community activity.    Personal Factors and Comorbidities Fitness;Time since onset of injury/illness/exacerbation    Examination-Activity Limitations Sleep;Lift;Carry;Stairs;Squat    Examination-Participation Restrictions Community Activity;Other    Stability/Clinical Decision Making Stable/Uncomplicated    Rehab Potential Good    PT Frequency 2x / week    PT Duration 6 weeks    PT Treatment/Interventions ADLs/Self Care Home Management;Cryotherapy;Electrical Stimulation;Iontophoresis 4mg /ml Dexamethasone;Moist Heat;Biofeedback;Traction;Ultrasound;Gait training;Stair training;Functional mobility training;Therapeutic activities;Therapeutic exercise;Balance training;Neuromuscular re-education;Patient/family education;Orthotic Fit/Training;Manual techniques;Passive range of motion;Dry needling;Taping;Spinal Manipulations;Joint Manipulations    PT Next Visit Plan review HEP, LAQ with 10 lbs, lateral step up, STS    PT Home Exercise Plan [email or web address]    Consulted and Agree with Plan of Care Patient           Patient  will benefit from skilled therapeutic intervention in order to improve the following deficits and impairments:  Abnormal gait,Pain,Hypomobility,Decreased activity tolerance,Decreased strength,Decreased balance,Decreased mobility,Decreased range of motion,Impaired flexibility  Visit Diagnosis: Right hip pain  Left hip pain  Pain in joint of right knee  Muscle weakness (generalized)  Difficulty walking     Problem List Patient Active Problem List   Diagnosis Date  Noted  . Allergic rhinitis 08/30/2020  . Chronic kidney disease due to hypertension 08/30/2020  . Type 2 diabetes mellitus with other specified complication ([place]) 16/03/9603  . History of malignant neoplasm of skin 08/30/2020  . Hyperlipidemia 08/30/2020  . 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 Morbid obesity ([place]) 08/30/2020  . Osteopenia 08/30/2020  . Personal history of colonic polyps 08/30/2020  . Vitamin D deficiency 08/30/2020  . Essential hypertension 03/03/2020    Daleen Bo PT, DPT 10/06/20 9:57 AM   [facility] [address] [place], [place], [address] Phone: [phone]   Fax:  [phone]  Name: Seri Kimmer MRN: [id number] Date of Birth: 1950-12-03

## 2020-10-06 NOTE — Patient Instructions (Signed)
Access Code: [email or web address] URL: [email or web address] Date: 10/06/2020 Prepared by: Daleen Bo  Exercises Side Lunge Adductor Stretch - 2 x daily - 7 x weekly - 1 sets - 3 reps - 30 hold Prone Hip Flexor Stretch on Table with Strap - 2 x daily - 7 x weekly - 1 sets - 3 reps - 30 hold Supine Active Straight Leg Raise - 1 x daily - 4 x weekly - 3 sets - 10 reps Supine Bridge with Resistance Band - 1 x daily - 4 x weekly - 2 sets - 10 reps Side Stepping with Resistance at Thighs - 1 x daily - 4 x weekly - 1 sets - 1 reps - 73ft hold

## 2020-10-13 ENCOUNTER — Encounter: Payer: Medicare HMO | Admitting: Physical Therapy

## 2020-10-15 NOTE — Progress Notes (Signed)
Rito Ehrlich, am serving as a Education administrator for Dr. [name].  Tina [place] is a 70 y.o. female who presents to [place] [facility] today for f/u of B hip pain, R>L.  She was last seen by Dr. Georgina Snell on 08/23/20 and was referred to PT of which she has completed 5 sessions.  Since her last visit, pt reports that she has not noticed any huge changes even with PT. Patient states that she is getting better about doing her exercises at home. Pain is just the Right hip.    Diagnostic testing: B hip and R knee XR- 08/23/20   Pertinent review of systems: No fevers or chills  Relevant historical information: Hypertension and diabetes.  History of melanoma face   Exam:  BP 110/70 (BP Location: Left Arm, Patient Position: Sitting, Cuff Size: Normal)   Pulse 76   Ht 5\' 4"  (1.626 m)   Wt 183 lb (83 kg)   LMP 06/12/2000   SpO2 98%   BMI 31.41 kg/m  General: Well Developed, well nourished, and in no acute distress.   MSK: Right hip normal-appearing Mildly tender palpation right lateral hip Normal hip motion. Right knee normal-appearing normal motion    Lab and Radiology Results MM 3D SCREEN BREAST BILATERAL  Result Date: 07/31/2020 CLINICAL DATA:  Screening. EXAM: DIGITAL SCREENING BILATERAL MAMMOGRAM WITH TOMOSYNTHESIS AND CAD TECHNIQUE: Bilateral screening digital craniocaudal and mediolateral oblique mammograms were obtained. Bilateral screening digital breast tomosynthesis was performed. The images were evaluated with computer-aided detection. COMPARISON:  Previous exam(s). ACR Breast Density Category c: The breast tissue is heterogeneously dense, which may obscure small masses. FINDINGS: There are no findings suspicious for malignancy. IMPRESSION: No mammographic evidence of malignancy. A result letter of this screening mammogram will be mailed directly to the patient. RECOMMENDATION: Screening mammogram in one year. (Code:SM-B-01Y) BI-RADS CATEGORY  1: Negative.  Electronically Signed   By: Lovey Newcomer M.D.   On: 07/31/2020 06:34   DG HIPS BILAT WITH PELVIS 3-4 VIEWS  Result Date: 08/23/2020 CLINICAL DATA:  Hip pain. EXAM: DG HIP (WITH OR WITHOUT PELVIS) 3-4V BILAT COMPARISON:  None. FINDINGS: There is osteopenia which limits detection of nondisplaced fractures. There is no acute displaced fracture or dislocation. There are mild degenerative changes of both hips. No radiopaque foreign body. IMPRESSION: No acute displaced fracture or dislocation. Electronically Signed   By: Constance Holster M.D.   On: 08/23/2020 13:16   DG Knee AP/LAT W/[place] Right  Result Date: 08/23/2020 CLINICAL DATA:  Chronic knee pain. EXAM: RIGHT KNEE 3 VIEWS COMPARISON:  None. FINDINGS: Mild tricompartmental degenerative changes are noted, greatest within the medial and patellofemoral compartments. There is no acute displaced fracture. No significant joint effusion. Vascular calcifications are noted. IMPRESSION: Mild tricompartmental degenerative changes. Electronically Signed   By: Constance Holster M.D.   On: 08/23/2020 13:18   I, [name], personally (independently) visualized and performed the interpretation of the images attached in this note.     Assessment and Plan: 70 y.o. 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  She has had improvement with physical therapy some but still having some pain.  After discussion potential neck steps she elects to continue conservative management with home exercise program.  Potential neck step would be MRI arthrogram hip to further evaluate cause of pain.  Ultimately she may require hip  replacement but she is certainly doing too well right now to consider that. She will let me know when if she is ready for MRI arthrogram.  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  Doing quite well as things stand.   Next step would be steroid injection.  Again not hurting enough to consider that.  She will let me know in the future when she is ready for a steroid injection.    Discussed warning signs or symptoms. Please see discharge instructions. Patient expresses understanding.   The above documentation has been reviewed and is accurate and complete [name], M.D.  Total encounter time 20 minutes including face-to-face time with the patient and, reviewing past medical record, and charting on the date of service.

## 2020-10-18 ENCOUNTER — Other Ambulatory Visit: Payer: Self-pay

## 2020-10-18 ENCOUNTER — Encounter: Payer: Self-pay | Admitting: Family Medicine

## 2020-10-18 ENCOUNTER — Ambulatory Visit (INDEPENDENT_AMBULATORY_CARE_PROVIDER_SITE_OTHER): Payer: Medicare HMO | Admitting: Physician Assistant

## 2020-10-18 ENCOUNTER — Encounter (INDEPENDENT_AMBULATORY_CARE_PROVIDER_SITE_OTHER): Payer: Self-pay | Admitting: Physician Assistant

## 2020-10-18 ENCOUNTER — Ambulatory Visit: Payer: Medicare HMO | Admitting: Family Medicine

## 2020-10-18 VITALS — BP 110/70 | HR 76 | Ht 64.0 in | Wt 183.0 lb

## 2020-10-18 VITALS — BP 119/73 | HR 88 | Temp 98.1°F | Ht 64.0 in | Wt 180.0 lb

## 2020-10-18 DIAGNOSIS — G8929 Other chronic pain: Secondary | ICD-10-CM | POA: Diagnosis not present

## 2020-10-18 DIAGNOSIS — M25561 Pain in right knee: Secondary | ICD-10-CM

## 2020-10-18 DIAGNOSIS — E7849 Other hyperlipidemia: Secondary | ICD-10-CM

## 2020-10-18 DIAGNOSIS — E559 Vitamin D deficiency, unspecified: Secondary | ICD-10-CM | POA: Diagnosis not present

## 2020-10-18 DIAGNOSIS — E1169 Type 2 diabetes mellitus with other specified complication: Secondary | ICD-10-CM

## 2020-10-18 DIAGNOSIS — Z6837 Body mass index (BMI) 37.0-37.9, adult: Secondary | ICD-10-CM | POA: Diagnosis not present

## 2020-10-18 DIAGNOSIS — M25559 Pain in unspecified hip: Secondary | ICD-10-CM | POA: Diagnosis not present

## 2020-10-18 NOTE — Patient Instructions (Addendum)
Thank you for coming in today.  Continue the home exercixes and finish out PT.   I can do more if needed.  Just let me know.   Knee: Next step is likely injection.   Hip: Next step is MRI arthrogram (MRI with injection) Let me know. That's a phone call or mychart message to arrange it.   Let me know. This holding pattern is fine.

## 2020-10-18 NOTE — Progress Notes (Signed)
Chief Complaint:   OBESITY [place] is here to discuss her progress with her obesity treatment plan along with follow-up of her obesity related diagnoses. [place] is on the Category 2 Plan and states she is following her eating plan approximately 90% of the time. [place] states she is doing physical therapy for 20 minutes 7 times per week.  Today's visit was #: 13 Starting weight: 226 lbs Starting date: 12/06/2019 Today's weight: 180 lbs Today's date: 10/18/2020 Total lbs lost to date: 46 Total lbs lost since last in-office visit: 3  Interim History: [place] did very well with weight loss. She continues to weigh and measure her food. Her hunger is controlled. She reports that she is "tired of dieting".  Subjective:   1. Type 2 diabetes mellitus with other specified complication, without long-term current use of insulin (HCC) [place] is not on medications, and her last A1c was 5.6.  2. Vitamin D deficiency [place] is on Vit D OTC daily.  3. Other hyperlipidemia [place] is on Zocor, and she denies chest pain or myalgias.  Assessment/Plan:   1. Type 2 diabetes mellitus with other specified complication, without long-term current use of insulin (HCC) We will check labs today. [place] will continue her meal plan. Good blood sugar control is important to decrease the likelihood of diabetic complications such as nephropathy, neuropathy, limb loss, blindness, coronary artery disease, and death. Intensive lifestyle modification including diet, exercise and weight loss are the first line of treatment for diabetes.   - Comprehensive metabolic panel - Hemoglobin A1c - Insulin, random  2. Vitamin D deficiency Low Vitamin D level contributes to fatigue and are associated with obesity, breast, and colon cancer. We will check labs today, and [place] will continue taking Vitamin D 2,000 IU daily. She will follow-up for routine testing of Vitamin D, at least 2-3 times per year to avoid  over-replacement.  - VITAMIN D 25 Hydroxy (Vit-D Deficiency, Fractures)  3. Other hyperlipidemia Cardiovascular risk and specific lipid/LDL goals reviewed. We discussed several lifestyle modifications today. We will check labs today. [place] will continue her medications, and will continue to work on diet, exercise and weight loss efforts. Orders and follow up as documented in patient record.   Counseling Intensive lifestyle modifications are the first line treatment for this issue. . Dietary changes: Increase soluble fiber. Decrease simple carbohydrates. . Exercise changes: Moderate to vigorous-intensity aerobic activity 150 minutes per week if tolerated. . Lipid-lowering medications: see documented in medical record.  - Lipid panel  4. Class 2 severe obesity with serious comorbidity and body mass index (BMI) of 37.0 to 37.9 in adult, unspecified obesity type (HCC) [place] is currently in the action stage of change. As such, her goal is to continue with weight loss efforts. She has agreed to the Category 2 Plan + 100 calories.   Exercise goals: As is.  Behavioral modification strategies: meal planning and cooking strategies and keeping healthy foods in the home.  T[place] has agreed to follow-up with our clinic in 4 weeks. She was informed of the importance of frequent follow-up visits to maximize her success with intensive lifestyle modifications for her multiple health conditions.   [place] was informed we would discuss her lab results at her next visit unless there is a critical issue that needs to be addressed sooner. [place] agreed to keep her next visit at the agreed upon time to discuss these results.  Objective:   Blood pressure 119/73, pulse 88, temperature 98.1 F (36.7 C), height  5\' 4"  (1.626 m), weight 180 lb (81.6 kg), last menstrual period 06/12/2000, SpO2 99 %. Body mass index is 30.9 kg/m.  General: Cooperative, alert, well developed, in no acute distress. HEENT:  Conjunctivae and lids unremarkable. Cardiovascular: Regular rhythm.  Lungs: Normal work of breathing. Neurologic: No focal deficits.   Lab Results  Component Value Date   CREATININE 0.72 06/22/2020   BUN 20 06/22/2020   NA 140 06/22/2020   K 4.2 06/22/2020   CL 102 06/22/2020   CO2 23 06/22/2020   Lab Results  Component Value Date   ALT 21 06/22/2020   AST 19 06/22/2020   ALKPHOS 61 06/22/2020   BILITOT 0.4 06/22/2020   Lab Results  Component Value Date   HGBA1C 5.6 06/22/2020   HGBA1C 5.8 (H) 03/30/2020   HGBA1C 6.5 10/31/2019   Lab Results  Component Value Date   INSULIN 17.6 06/22/2020   INSULIN 13.9 03/30/2020   INSULIN 24.9 12/09/2019   Lab Results  Component Value Date   TSH 2.260 06/22/2020   Lab Results  Component Value Date   CHOL 194 06/22/2020   HDL 64 06/22/2020   LDLCALC 113 (H) 06/22/2020   TRIG 94 06/22/2020   Lab Results  Component Value Date   WBC 8.4 07/30/2020   HGB 13.1 07/30/2020   HCT 38.8 07/30/2020   MCV 89.6 07/30/2020   PLT 178 07/30/2020   No results found for: IRON, TIBC, FERRITIN  Obesity Behavioral Intervention:   Approximately 15 minutes were spent on the discussion below.  ASK: We discussed the diagnosis of obesity with [place] today and [place] agreed to give [name] permission to discuss obesity behavioral modification therapy today.  ASSESS: [place] has the diagnosis of obesity and her BMI today is 30.88. [place] is in the action stage of change.   ADVISE: [place] was educated on the multiple health risks of obesity as well as the benefit of weight loss to improve her health. She was advised of the need for long term treatment and the importance of lifestyle modifications to improve her current health and to decrease her risk of future health problems.  AGREE: Multiple dietary modification options and treatment options were discussed and [place] agreed to follow the recommendations documented in the above  note.  ARRANGE: [place] was educated on the importance of frequent visits to treat obesity as outlined per CMS and USPSTF guidelines and agreed to schedule her next follow up appointment today.  Attestation Statements:   Reviewed by clinician on day of visit: allergies, medications, problem list, medical history, surgical history, family history, social history, and previous encounter notes.   Wilhemena Durie, am acting as transcriptionist for [employer], PA-C.  I have reviewed the above documentation for accuracy and completeness, and I agree with the above. Abby Potash, PA-C

## 2020-10-19 LAB — LIPID PANEL
Chol/HDL Ratio: 3 ratio (ref 0.0–4.4)
Cholesterol, Total: 196 mg/dL (ref 100–199)
HDL: 65 mg/dL (ref >39)
LDL Chol Calc (NIH): 114 mg/dL — ABNORMAL HIGH (ref 0–99)
Triglycerides: 98 mg/dL (ref 0–149)
VLDL Cholesterol Cal: 17 mg/dL (ref 5–40)

## 2020-10-19 LAB — COMPREHENSIVE METABOLIC PANEL
ALT: 22 IU/L (ref 0–32)
AST: 22 IU/L (ref 0–40)
Albumin/Globulin Ratio: 2 (ref 1.2–2.2)
Albumin: 4.7 g/dL (ref 3.8–4.8)
Alkaline Phosphatase: 58 IU/L (ref 44–121)
BUN/Creatinine Ratio: 23 (ref 12–28)
BUN: 18 mg/dL (ref 8–27)
Bilirubin Total: 0.4 mg/dL (ref 0.0–1.2)
CO2: 24 mmol/L (ref 20–29)
Calcium: 9.7 mg/dL (ref 8.7–10.3)
Chloride: 104 mmol/L (ref 96–106)
Creatinine, Ser: 0.78 mg/dL (ref 0.57–1.00)
Globulin, Total: 2.4 g/dL (ref 1.5–4.5)
Glucose: 103 mg/dL — ABNORMAL HIGH (ref 65–99)
Potassium: 4.3 mmol/L (ref 3.5–5.2)
Sodium: 144 mmol/L (ref 134–144)
Total Protein: 7.1 g/dL (ref 6.0–8.5)
eGFR: 82 mL/min/{1.73_m2} (ref >59)

## 2020-10-19 LAB — VITAMIN D 25 HYDROXY (VIT D DEFICIENCY, FRACTURES): Vit D, 25-Hydroxy: 58.1 ng/mL (ref 30.0–100.0)

## 2020-10-19 LAB — HEMOGLOBIN A1C
Est. average glucose Bld gHb Est-mCnc: 108 mg/dL
Hgb A1c MFr Bld: 5.4 % (ref 4.8–5.6)

## 2020-10-19 LAB — INSULIN, RANDOM: INSULIN: 13.6 u[IU]/mL (ref 2.6–24.9)

## 2020-10-20 ENCOUNTER — Other Ambulatory Visit: Payer: Self-pay

## 2020-10-20 ENCOUNTER — Ambulatory Visit: Payer: Medicare HMO | Admitting: Physical Therapy

## 2020-10-20 ENCOUNTER — Encounter: Payer: Self-pay | Admitting: Physical Therapy

## 2020-10-20 DIAGNOSIS — M6281 Muscle weakness (generalized): Secondary | ICD-10-CM | POA: Diagnosis not present

## 2020-10-20 DIAGNOSIS — R262 Difficulty in walking, not elsewhere classified: Secondary | ICD-10-CM | POA: Diagnosis not present

## 2020-10-20 DIAGNOSIS — M25551 Pain in right hip: Secondary | ICD-10-CM

## 2020-10-20 DIAGNOSIS — M25561 Pain in right knee: Secondary | ICD-10-CM | POA: Diagnosis not present

## 2020-10-20 NOTE — Therapy (Signed)
[facility] [address] [place], [place], [address] Phone: [phone]   Fax:  [phone]  Physical Therapy Treatment  Patient Details  Name: Tina Sutton MRN: [id number] Date of Birth: July 28, 1950 Referring Provider (PT): Dr. Georgina Snell   Encounter Date: 10/20/2020   PT End of Session - 10/20/20 0924    Visit Number 6    Number of Visits 13    Date for PT Re-Evaluation 10/29/20    Authorization Type [employer]    PT Start Time 0845    PT Stop Time 0930    PT Time Calculation (min) 45 min    Activity Tolerance Patient tolerated treatment well;Patient limited by pain    Behavior During Therapy [facility] for tasks assessed/performed           Past Medical History:  Diagnosis Date  . Depression   . Dysmenorrhea   . Fibroid   . Hyperlipemia   . Hyperlipidemia   . Hypertension   . Inverted nipple x several yrs   bilateral, pt states nipples have always been inverted  . Joint pain   . Obesity   . Sleep apnea   . SOB (shortness of breath)   . Vitamin D deficiency     Past Surgical History:  Procedure Laterality Date  . BLEPHAROPLASTY Bilateral 02/21/2008  . BREAST BIOPSY Right   . BREAST EXCISIONAL BIOPSY Right    benign  . PELVIC LAPAROSCOPY    . TOTAL ABDOMINAL HYSTERECTOMY  05/04/2001    There were no vitals filed for this visit.   Subjective Assessment - 10/20/20 0843    Subjective Pt states the "only difference she can tell is able to go up the steps easier" but down is not as easy. The hip improvement is "marginal" but does note better quad flexibility. She states she would like to continue with PT and conservative management.    How long can you stand comfortably? >1hr    How long can you walk comfortably? >1hr    Diagnostic tests X-ray of hip and knee    Patient Stated Goals Return to activity without any pain    Currently in Pain? No/denies    Pain Score 0-No pain                              [facility] Adult PT Treatment/Exercise - 10/20/20 0001      Ambulation/Gait   Gait Pattern Trendelenburg;Decreased stance time - right      Exercises   Exercises Knee/Hip      Knee/Hip Exercises: Stretches   Other Knee/Hip Stretches standing lunge stretch at stairs 30s 3x   reviewed for home   Other Knee/Hip Stretches heel drop 2x10 5s holds      Knee/Hip Exercises: Aerobic   Recumbent Bike L1 2.5 mins stopped due to hip pain      Knee/Hip Exercises: Standing   Forward Step Up Limitations 2x10 double UE support    Other Standing Knee Exercises STS 2x10 with RTB at knees                   Knee/Hip Exercises: Supine   Other Supine Knee/Hip Exercises SLR 2x10 2lbs      Knee/Hip Exercises: Sidelying   Hip ABduction 2 sets;10 reps  PT Education - 10/20/20 1216    Education Details HEP modifications, joint protection, POC, exercise/fitness, regular daily activity, anatomy, exercise progression, step up/squat mechanics   Person(s) Educated Patient    Methods Explanation;Demonstration;Tactile cues;Verbal cues;Handout    Comprehension Returned demonstration;Verbal cues required;Tactile cues required;Verbalized understanding            PT Short Term Goals - 09/29/20 1212      PT SHORT TERM GOAL #1   Title Pt will become independent with HEP in order to demonstrate synthesis of PT education.    Time 2    Period Weeks    Status On-going      PT SHORT TERM GOAL #2   Title Pt will be able to demonstrate R S/L without pain in order to demonstrate functional improvement in R LE static positioning in order to promote proper sleep and recovery.    Time 3    Period Weeks    Status On-going             PT Long Term Goals - 09/29/20 1212      PT LONG TERM GOAL #1   Title Pt will be able to demonstrate reciprocal stair management pattern in order to demonstrate functional improvement in LE function  for improve mobility for house hold duties.    Time 6    Period Weeks    Status On-going      PT LONG TERM GOAL #2   Title Pt will be able to perform >10 STS without UE support in order to demonstrate functional improvement in R LE strength and tolerate to loaded activity.    Time 6    Period Weeks    Status On-going                 Plan - 10/20/20 3614    Clinical Impression Statement Pt with continued R hip and knee pain but demonstrates improved ability to tolerate increased CKC loading with minimal discomfort. Pt does demonstrate moderate endurance deficits with LE strength, requiring frequent rest break between sets. Pt was given edu about incorporating regular daily exercise and walking to help with general physical fitness, joint mobility, as well as improving functional capacity to activity. Pt gave verbal understanding. Pt to reduce frequency of visits in order to facilitate more independence with HEP and self strengthening. Pt agreed to POC update. Pt would benefit from continued skilled therapy in order to reach goals and maximize functional R LE strength and ROM for full return to PLOF.    Personal Factors and Comorbidities Fitness;Time since onset of injury/illness/exacerbation    Examination-Activity Limitations Sleep;Lift;Carry;Stairs;Squat    Examination-Participation Restrictions Community Activity;Other    Stability/Clinical Decision Making Stable/Uncomplicated    Rehab Potential Good    PT Frequency 2x / week    PT Duration 6 weeks    PT Treatment/Interventions ADLs/Self Care Home Management;Cryotherapy;Electrical Stimulation;Iontophoresis 4mg /ml Dexamethasone;Moist Heat;Biofeedback;Traction;Ultrasound;Gait training;Stair training;Functional mobility training;Therapeutic activities;Therapeutic exercise;Balance training;Neuromuscular re-education;Patient/family education;Orthotic Fit/Training;Manual techniques;Passive range of motion;Dry needling;Taping;Spinal  Manipulations;Joint Manipulations    PT Next Visit Plan review HEP, LAQ with 10 lbs, lateral step up, STS    PT Home Exercise Plan [email or web address]    Consulted and Agree with Plan of Care Patient           Patient will benefit from skilled therapeutic intervention in order to improve the following deficits and impairments:  Abnormal gait,Pain,Hypomobility,Decreased activity tolerance,Decreased strength,Decreased balance,Decreased mobility,Decreased range of motion,Impaired flexibility  Visit Diagnosis: Pain in joint of right knee  Right hip pain  Muscle weakness (generalized)  Difficulty walking     Problem List Patient Active Problem List   Diagnosis Date Noted  . Allergic rhinitis 08/30/2020  . Chronic kidney disease due to hypertension 08/30/2020  . Type 2 diabetes mellitus with other specified complication ([place]) 30/13/1438  . History of malignant neoplasm of skin 08/30/2020  . Hyperlipidemia 08/30/2020  . Malignant melanoma of face ([place]) 08/30/2020  . Morbid obesity ([place]) 08/30/2020  . Osteopenia 08/30/2020  . Personal history of colonic polyps 08/30/2020  . Vitamin D deficiency 08/30/2020  . Essential hypertension 03/03/2020    Daleen Bo PT, DPT 10/20/20 12:23 PM   [place], [place], [address] Phone: [phone]   Fax:  [phone]  Name: Doreather Hoxworth MRN: [id number] Date of Birth: 10/09/1950

## 2020-10-20 NOTE — Patient Instructions (Signed)
Access Code: [email or web address] URL: [email or web address] Date: 10/20/2020 Prepared by: Daleen Bo  Exercises Sidelying Quadriceps Stretch - 2 x daily - 7 x weekly - 1 sets - 3 reps - 30 hold Side Stepping with Resistance at Thighs - 1 x daily - 4 x weekly - 1 sets - 1 reps - 35ft hold Supine Bridge - 1 x daily - 4 x weekly - 2 sets - 20 reps Sit to Stand with Resistance Around Legs - 1 x daily - 4 x weekly - 3 sets - 10 reps Standing Bilateral Heel Raise on Step - 1 x daily - 4 x weekly - 3 sets - 10 reps

## 2020-10-26 DIAGNOSIS — H2511 Age-related nuclear cataract, right eye: Secondary | ICD-10-CM | POA: Diagnosis not present

## 2020-10-26 DIAGNOSIS — H25811 Combined forms of age-related cataract, right eye: Secondary | ICD-10-CM | POA: Diagnosis not present

## 2020-11-02 ENCOUNTER — Encounter: Payer: Self-pay | Admitting: Family Medicine

## 2020-11-02 DIAGNOSIS — M25559 Pain in unspecified hip: Secondary | ICD-10-CM

## 2020-11-03 ENCOUNTER — Encounter: Payer: Medicare HMO | Admitting: Physical Therapy

## 2020-11-05 DIAGNOSIS — Z1389 Encounter for screening for other disorder: Secondary | ICD-10-CM | POA: Diagnosis not present

## 2020-11-05 DIAGNOSIS — H35033 Hypertensive retinopathy, bilateral: Secondary | ICD-10-CM | POA: Diagnosis not present

## 2020-11-05 DIAGNOSIS — E669 Obesity, unspecified: Secondary | ICD-10-CM | POA: Diagnosis not present

## 2020-11-05 DIAGNOSIS — E785 Hyperlipidemia, unspecified: Secondary | ICD-10-CM | POA: Diagnosis not present

## 2020-11-05 DIAGNOSIS — I10 Essential (primary) hypertension: Secondary | ICD-10-CM | POA: Diagnosis not present

## 2020-11-05 DIAGNOSIS — Z0000 Encounter for general adult medical examination without abnormal findings: Secondary | ICD-10-CM | POA: Diagnosis not present

## 2020-11-10 ENCOUNTER — Encounter: Payer: Medicare HMO | Admitting: Physical Therapy

## 2020-11-15 ENCOUNTER — Other Ambulatory Visit: Payer: Self-pay

## 2020-11-15 ENCOUNTER — Ambulatory Visit (INDEPENDENT_AMBULATORY_CARE_PROVIDER_SITE_OTHER): Payer: Medicare HMO | Admitting: Sports Medicine

## 2020-11-15 ENCOUNTER — Ambulatory Visit (INDEPENDENT_AMBULATORY_CARE_PROVIDER_SITE_OTHER): Payer: Medicare HMO

## 2020-11-15 DIAGNOSIS — G8929 Other chronic pain: Secondary | ICD-10-CM | POA: Diagnosis not present

## 2020-11-15 DIAGNOSIS — M25551 Pain in right hip: Secondary | ICD-10-CM

## 2020-11-15 DIAGNOSIS — M25451 Effusion, right hip: Secondary | ICD-10-CM | POA: Diagnosis not present

## 2020-11-15 DIAGNOSIS — M25559 Pain in unspecified hip: Secondary | ICD-10-CM

## 2020-11-15 DIAGNOSIS — S73191A Other sprain of right hip, initial encounter: Secondary | ICD-10-CM | POA: Diagnosis not present

## 2020-11-15 DIAGNOSIS — Z90710 Acquired absence of both cervix and uterus: Secondary | ICD-10-CM | POA: Diagnosis not present

## 2020-11-15 MED ORDER — GADOBUTROL 1 MMOL/ML IV SOLN
1.0000 mL | Freq: Once | INTRAVENOUS | Status: AC | PRN
  Administered 2020-11-15: 1 mL via INTRAVENOUS

## 2020-11-15 NOTE — Progress Notes (Signed)
    Procedures performed today:    Procedure: Real-time Ultrasound Guided gadolinium contrast injection of right hip joint Device: Samsung HS60  Verbal informed consent obtained.  Time-out conducted.  Noted no overlying erythema, induration, or other signs of local infection.  Skin prepped in a sterile fashion.  Local anesthesia: Topical Ethyl chloride.  With sterile technique and under real time ultrasound guidance: Noted mildly arthritic hip joint, I advanced a 22-gauge spinal needle to the femoral head/neck junction, contacted bone and injected 1 cc kenalog 40, 2 cc lidocaine, 2 cc bupivacaine, syringe switched and 0.1 cc gadolinium injected, syringe again switched and 10 cc sterile saline used to fully distend the joint. Joint visualized and capsule seen distending confirming intra-articular placement of contrast material and medication. Completed without difficulty  Advised to call if fevers/chills, erythema, induration, drainage, or persistent bleeding.  Images permanently stored in PACS Impression: Technically successful ultrasound guided gadolinium contrast injection for MR arthrography.  Please see separate MR arthrogram report.   Independent interpretation of notes and tests performed by another provider:   None.  Brief History, Exam, Impression, and Recommendations:    Chronic right hip pain Injection for hip MR arthrography, further management per primary treating provider.    ___________________________________________ Gwen Her. Dianah Field, M.D., ABFM., CAQSM. Primary Care and [place] Instructor of [place] [employer] [facility] [employer]

## 2020-11-15 NOTE — Assessment & Plan Note (Signed)
Injection for hip MR arthrography, further management per primary treating provider.

## 2020-11-16 ENCOUNTER — Ambulatory Visit (INDEPENDENT_AMBULATORY_CARE_PROVIDER_SITE_OTHER): Payer: Medicare HMO | Admitting: Family Medicine

## 2020-11-16 ENCOUNTER — Other Ambulatory Visit: Payer: Self-pay

## 2020-11-16 ENCOUNTER — Encounter (INDEPENDENT_AMBULATORY_CARE_PROVIDER_SITE_OTHER): Payer: Self-pay | Admitting: Family Medicine

## 2020-11-16 VITALS — BP 140/71 | HR 65 | Temp 98.0°F | Ht 64.0 in | Wt 181.0 lb

## 2020-11-16 DIAGNOSIS — E559 Vitamin D deficiency, unspecified: Secondary | ICD-10-CM

## 2020-11-16 DIAGNOSIS — Z6838 Body mass index (BMI) 38.0-38.9, adult: Secondary | ICD-10-CM | POA: Diagnosis not present

## 2020-11-16 DIAGNOSIS — E7849 Other hyperlipidemia: Secondary | ICD-10-CM

## 2020-11-16 NOTE — Progress Notes (Signed)
Right hip MRI shows fraying of the labrum with a small tear.  Otherwise it looks to be in good shape.  Please return to clinic to go over the results in full detail and discuss treatment plan and options.

## 2020-11-21 NOTE — Progress Notes (Signed)
I, Tina Sutton, LAT, ATC acting as a scribe for Tina Leader, MD.  Tina Sutton is a 70 y.o. female who presents to [facility] today for f/u of R hip pain R hip MRI review, and R knee pain. Pt was previously referred to PT and completed a total of 6 visits. Pt was last seen by Dr. Georgina Snell on 10/18/20 and was advised to cont conservative management. Pt sent a MyChart message on 11/02/20 and reporting that she was experiencing rating pain down her R leg and requested a MRI be ordered. Today, pt reports redness around the injection site from her MRI. Pt c/o continued R hip pain and R knee is about the same. Pt c/o aching in her R hip, even at rest.  Dx imaging: 11/15/20 R hip MRI  08/23/20 R knee & bilat hip XR  Pertinent review of systems: No fevers or chills  Relevant historical information: Hypertension, diabetes   Exam:  BP 128/84 (BP Location: Right Arm, Patient Position: Sitting, Cuff Size: Normal)   Pulse 84   Ht 5\' 4"  (1.626 m)   Wt 183 lb 6.4 oz (83.2 kg)   LMP 06/12/2000   SpO2 98%   BMI 31.48 kg/m  General: Well Developed, well nourished, and in no acute distress.   MSK: L-spine: Nontender midline normal lumbar motion negative slump test. Right hip normal-appearing Normal motion. Pain with hip flexion and rotation.   Skin: Bruising anterior hip.  Central portion of bruising with small nontender erythematous rash.      Lab and Radiology Results  EXAM: MRI OF THE RIGHT HIP WITH CONTRAST   TECHNIQUE: Multiplanar, multisequence MR imaging was performed following the administration of intravenous contrast.   CONTRAST:  16mL GADAVIST GADOBUTROL 1 MMOL/ML IV SOLN   COMPARISON:  Bilateral hip x-rays dated August 23, 2020.   FINDINGS: Bones: There is no evidence of acute fracture, dislocation or avascular necrosis. No focal bone lesion. The visualized sacroiliac joints and symphysis pubis appear normal.   Articular cartilage and  labrum   Articular cartilage: No focal chondral defect or subchondral signal abnormality identified.   Labrum: Fraying of the right anterior superior labrum with small tear (series 6, image 7; series 8, image 15). Remaining labrum is intact.   Joint or bursal effusion   Joint effusion: The right hip joint is distended with intra-articular contrast. No left hip joint effusion.   Bursae: No focal periarticular fluid collection.   Muscles and tendons   Muscles and tendons: The visualized gluteus, hamstring and iliopsoas tendons appear normal. No muscle edema or atrophy.   Other findings   Miscellaneous: Prior hysterectomy. Left-sided colonic diverticulosis.   IMPRESSION: 1. Fraying of the right anterior superior labrum with small tear.     Electronically Signed   By: Titus Dubin M.D.   On: 11/15/2020 10:34  I, Tina Sutton, personally (independently) visualized and performed the interpretation of the images attached in this note.      Assessment and Plan: 70 y.o. female with Right hip/groin pain. Pain likely due to right hip labrum tear pain certainly could be due to L2 radiculopathy however this is less likely.  She has worse pain in her hip MRI would really indicate but this is still a possibility.  Discussed options.  Plan for a bit of watchful waiting.  Work on home exercises.  If not improving may benefit from surgical consultation.  Rash.  Patient experienced bruising with the injection part of the  MRI arthrogram.  Central portion of it has a little bit of a rash that is warm an irritant dermatitis type.  Recommend hydrocortisone cream.  If worsening return to clinic for further evaluation.  Cellulitis very unlikely.    Discussed warning signs or symptoms. Please see discharge instructions. Patient expresses understanding.   The above documentation has been reviewed and is accurate and complete Tina Sutton, M.D.

## 2020-11-22 ENCOUNTER — Other Ambulatory Visit: Payer: Self-pay

## 2020-11-22 ENCOUNTER — Ambulatory Visit: Payer: Medicare HMO | Admitting: Family Medicine

## 2020-11-22 VITALS — BP 128/84 | HR 84 | Ht 64.0 in | Wt 183.4 lb

## 2020-11-22 DIAGNOSIS — M25559 Pain in unspecified hip: Secondary | ICD-10-CM

## 2020-11-22 DIAGNOSIS — R21 Rash and other nonspecific skin eruption: Secondary | ICD-10-CM | POA: Diagnosis not present

## 2020-11-22 NOTE — Patient Instructions (Signed)
Thank you for coming in today.   I think the pain is probably coming from your hip but it could be coming from your lumbar spine.   Let me know if you worsen.   I like your idea of water exercises.   I think it is ok to do water aerobics on your own.   If this is not good enough let e know and I will refer your to [facility] and they will show you more.   Try hyrodortisone cream on the rash. Keep me updated.

## 2020-11-24 NOTE — Progress Notes (Signed)
Chief Complaint:   OBESITY Tina Sutton is here to discuss her progress with her obesity treatment plan along with follow-up of her obesity related diagnoses. Tina Sutton is on the Category 2 Plan + 100 calories and states she is following her eating plan approximately 85% of the time. Tina Sutton states she is doing 0 minutes 0 times per week.  Today's visit was #: 14 Starting weight: 226 lbs Starting date: 12/06/2019 Today's weight: 181 lbs Today's date: 11/16/2020 Total lbs lost to date: 45 Total lbs lost since last in-office visit: 2  Interim History: Tina Sutton continues to do well with weight loss on her eating plan. Her hunger is mostly controlled and she is doing well with meal planning and prepping. Her exercise is limited due to her hip pain which she see's Dr. Georgina Snell.  Subjective:   1. Other hyperlipidemia Tina Sutton's LDL is above goal, but her HDL is very good at 65. She denies chest pain. She has questions about high cholesterol foods and how that affects her blood cholesterol levels.  2. Vitamin D deficiency Tina Sutton is on Vit D OTC, and she denies nausea, vomiting, or muscle weakness.  Assessment/Plan:   1. Other hyperlipidemia Cardiovascular risk and specific lipid/LDL goals reviewed. We discussed several lifestyle modifications today. Tina Sutton will continue diet, exercise, and Zocor, and will follow up as directed. Orders and follow up as documented in patient record.   2. Vitamin D deficiency Low Vitamin D level contributes to fatigue and are associated with obesity, breast, and colon cancer. Tina Sutton will continue OTC Vitamin D and will follow-up for routine testing of Vitamin D, at least 2-3 times per year to avoid over-replacement.  3. Obesity with current BMI 31.1 Tina Sutton is currently in the action stage of change. As such, her goal is to continue with weight loss efforts. She has agreed to the Category 2 Plan + 100 calories.   Exercise goals: Core strengthening exercise was  discussed.  Behavioral modification strategies: increasing lean protein intake and meal planning and cooking strategies.  Tina Sutton has agreed to follow-up with our clinic in 4 weeks. She was informed of the importance of frequent follow-up visits to maximize her success with intensive lifestyle modifications for her multiple health conditions.   Objective:   Blood pressure 140/71, pulse 65, temperature 98 F (36.7 C), height 5\' 4"  (1.626 m), weight 181 lb (82.1 kg), last menstrual period 06/12/2000, SpO2 97 %. Body mass index is 31.07 kg/m.  General: Cooperative, alert, well developed, in no acute distress. HEENT: Conjunctivae and lids unremarkable. Cardiovascular: Regular rhythm.  Lungs: Normal work of breathing. Neurologic: No focal deficits.   Lab Results  Component Value Date   CREATININE 0.78 10/18/2020   BUN 18 10/18/2020   NA 144 10/18/2020   K 4.3 10/18/2020   CL 104 10/18/2020   CO2 24 10/18/2020   Lab Results  Component Value Date   ALT 22 10/18/2020   AST 22 10/18/2020   ALKPHOS 58 10/18/2020   BILITOT 0.4 10/18/2020   Lab Results  Component Value Date   HGBA1C 5.4 10/18/2020   HGBA1C 5.6 06/22/2020   HGBA1C 5.8 (H) 03/30/2020   HGBA1C 6.5 10/31/2019   Lab Results  Component Value Date   INSULIN 13.6 10/18/2020   INSULIN 17.6 06/22/2020   INSULIN 13.9 03/30/2020   INSULIN 24.9 12/09/2019   Lab Results  Component Value Date   TSH 2.260 06/22/2020   Lab Results  Component Value Date   CHOL 196 10/18/2020  HDL 65 10/18/2020   LDLCALC 114 (H) 10/18/2020   TRIG 98 10/18/2020   CHOLHDL 3.0 10/18/2020   Lab Results  Component Value Date   WBC 8.4 07/30/2020   HGB 13.1 07/30/2020   HCT 38.8 07/30/2020   MCV 89.6 07/30/2020   PLT 178 07/30/2020   No results found for: IRON, TIBC, FERRITIN  Attestation Statements:   Reviewed by clinician on day of visit: allergies, medications, problem list, medical history, surgical history, family history,  social history, and previous encounter notes.  Time spent on visit including pre-visit chart review and post-visit care and charting was 30 minutes.    I, Trixie Dredge, am acting as transcriptionist for [name], MD.  I have reviewed the above documentation for accuracy and completeness, and I agree with the above. -  [name], MD

## 2020-12-16 ENCOUNTER — Encounter (INDEPENDENT_AMBULATORY_CARE_PROVIDER_SITE_OTHER): Payer: Self-pay | Admitting: Family Medicine

## 2020-12-16 ENCOUNTER — Other Ambulatory Visit: Payer: Self-pay

## 2020-12-16 ENCOUNTER — Ambulatory Visit (INDEPENDENT_AMBULATORY_CARE_PROVIDER_SITE_OTHER): Payer: Medicare HMO | Admitting: Family Medicine

## 2020-12-16 VITALS — BP 106/67 | HR 74 | Temp 98.2°F | Ht 64.0 in | Wt 179.0 lb

## 2020-12-16 DIAGNOSIS — I10 Essential (primary) hypertension: Secondary | ICD-10-CM

## 2020-12-16 DIAGNOSIS — Z6838 Body mass index (BMI) 38.0-38.9, adult: Secondary | ICD-10-CM | POA: Diagnosis not present

## 2020-12-23 NOTE — Progress Notes (Signed)
Chief Complaint:   OBESITY Tina Sutton is here to discuss her progress with her obesity treatment plan along with follow-up of her obesity related diagnoses. Tina Sutton is on the Category 2 Plan + 100 calories and states she is following her eating plan approximately 90% of the time. Tina Sutton states she is taking water class for 60 minutes 2-3 times per week.  Today's visit was #: 15 Starting weight: 226 lbs Starting date: 12/06/2019 Today's weight: 179 lbs Today's date: 12/16/2020 Total lbs lost to date: 47 Total lbs lost since last in-office visit: 2  Interim History: Tina Sutton continues to do very well with weight loss on her eating plan. She has started a pool aerobics class. She is trying to stay more active and occupied.  Subjective:   1. Essential hypertension, benign Tina Sutton's blood pressure continues to improve with weight loss. She is on losartan and hydrochlorothiazide.  Assessment/Plan:   1. Essential hypertension, benign Tina Sutton is ok to decrease hydrochlorothiazide to 1/2 tablet if she becomes symptomatic. She will continue working on healthy weight loss and exercise to improve blood pressure control. We will watch for signs of hypotension as she continues her lifestyle modifications.  2. Obesity with current BMI 30.8 Tina Sutton is currently in the action stage of change. As such, her goal is to continue with weight loss efforts. She has agreed to the Category 2 Plan + 100 calories.   Exercise goals: As is.  Behavioral modification strategies: increasing lean protein intake and meal planning and cooking strategies.  Chaunda has agreed to follow-up with our clinic in 8 weeks. She was informed of the importance of frequent follow-up visits to maximize her success with intensive lifestyle modifications for her multiple health conditions.   Objective:   Blood pressure 106/67, pulse 74, temperature 98.2 F (36.8 C), height 5\' 4"  (1.626 m), weight 179 lb (81.2 kg), last menstrual  period 06/12/2000, SpO2 97 %. Body mass index is 30.73 kg/m.  General: Cooperative, alert, well developed, in no acute distress. HEENT: Conjunctivae and lids unremarkable. Cardiovascular: Regular rhythm.  Lungs: Normal work of breathing. Neurologic: No focal deficits.   Lab Results  Component Value Date   CREATININE 0.78 10/18/2020   BUN 18 10/18/2020   NA 144 10/18/2020   K 4.3 10/18/2020   CL 104 10/18/2020   CO2 24 10/18/2020   Lab Results  Component Value Date   ALT 22 10/18/2020   AST 22 10/18/2020   ALKPHOS 58 10/18/2020   BILITOT 0.4 10/18/2020   Lab Results  Component Value Date   HGBA1C 5.4 10/18/2020   HGBA1C 5.6 06/22/2020   HGBA1C 5.8 (H) 03/30/2020   HGBA1C 6.5 10/31/2019   Lab Results  Component Value Date   INSULIN 13.6 10/18/2020   INSULIN 17.6 06/22/2020   INSULIN 13.9 03/30/2020   INSULIN 24.9 12/09/2019   Lab Results  Component Value Date   TSH 2.260 06/22/2020   Lab Results  Component Value Date   CHOL 196 10/18/2020   HDL 65 10/18/2020   LDLCALC 114 (H) 10/18/2020   TRIG 98 10/18/2020   CHOLHDL 3.0 10/18/2020   Lab Results  Component Value Date   VD25OH 58.1 10/18/2020   VD25OH 52.6 06/22/2020   VD25OH 64.4 03/30/2020   Lab Results  Component Value Date   WBC 8.4 07/30/2020   HGB 13.1 07/30/2020   HCT 38.8 07/30/2020   MCV 89.6 07/30/2020   PLT 178 07/30/2020   No results found for: IRON, TIBC, FERRITIN  Attestation  Statements:   Reviewed by clinician on day of visit: allergies, medications, problem list, medical history, surgical history, family history, social history, and previous encounter notes.  Time spent on visit including pre-visit chart review and post-visit care and charting was 42 minutes.    I, Trixie Dredge, am acting as transcriptionist for [name], MD.  I have reviewed the above documentation for accuracy and completeness, and I agree with the above. -  [name], MD

## 2021-01-28 DIAGNOSIS — H43812 Vitreous degeneration, left eye: Secondary | ICD-10-CM | POA: Diagnosis not present

## 2021-01-28 DIAGNOSIS — H35033 Hypertensive retinopathy, bilateral: Secondary | ICD-10-CM | POA: Diagnosis not present

## 2021-01-28 DIAGNOSIS — Z961 Presence of intraocular lens: Secondary | ICD-10-CM | POA: Diagnosis not present

## 2021-02-07 ENCOUNTER — Ambulatory Visit (INDEPENDENT_AMBULATORY_CARE_PROVIDER_SITE_OTHER): Payer: Medicare HMO | Admitting: Family Medicine

## 2021-02-07 ENCOUNTER — Other Ambulatory Visit: Payer: Self-pay

## 2021-02-07 ENCOUNTER — Encounter (INDEPENDENT_AMBULATORY_CARE_PROVIDER_SITE_OTHER): Payer: Self-pay | Admitting: Family Medicine

## 2021-02-07 VITALS — BP 124/76 | HR 64 | Temp 97.6°F | Ht 64.0 in | Wt 180.0 lb

## 2021-02-07 DIAGNOSIS — Z6838 Body mass index (BMI) 38.0-38.9, adult: Secondary | ICD-10-CM

## 2021-02-07 DIAGNOSIS — E1169 Type 2 diabetes mellitus with other specified complication: Secondary | ICD-10-CM

## 2021-02-07 DIAGNOSIS — E7849 Other hyperlipidemia: Secondary | ICD-10-CM | POA: Diagnosis not present

## 2021-02-07 MED ORDER — OZEMPIC (0.25 OR 0.5 MG/DOSE) 2 MG/1.5ML ~~LOC~~ SOPN: PEN_INJECTOR | SUBCUTANEOUS | 0 refills | Status: DC

## 2021-02-07 NOTE — Progress Notes (Signed)
Chief Complaint:   OBESITY Tina Sutton is here to discuss her progress with her obesity treatment plan along with follow-up of her obesity related diagnoses. Tina Sutton is on the Category 2 Plan + 100 calories and states she is following her eating plan approximately 90% of the time. Tina Sutton states she is walking for 30 minutes 2 times per week.  Today's visit was #: 16 Starting weight: 226 lbs Starting date: 12/06/2019 Today's weight: 180 lbs Today's date: 02/07/2021 Total lbs lost to date: 46 Total lbs lost since last in-office visit: 0  Interim History: Tina Sutton has kept her weight mostly the same over the last 2 months. Her hunger has become an issue recently and she is open to looking at medication options. She is working on increasing protein and appears to be meeting or exceeding her goals.  Subjective:   1. Type 2 diabetes mellitus with other specified complication, without long-term current use of insulin (HCC) Tina Sutton is working on diet, exercise, and weight loss to control her glucose levels. Her last A1c was well controlled at 5.4.  2. Other hyperlipidemia associated with Type 2 diabetes mellitus Tina Sutton is stable on Zocor. She is working on diet, exercise, and weight loss.   Assessment/Plan:   1. Type 2 diabetes mellitus with other specified complication, without long-term current use of insulin ([place]) Tina Sutton agreed to start Ozempic 0.25 mg q weekly with no refills. Good blood sugar control is important to decrease the likelihood of diabetic complications such as nephropathy, neuropathy, limb loss, blindness, coronary artery disease, and death. Intensive lifestyle modification including diet, exercise and weight loss are the first line of treatment for diabetes.   - Semaglutide,0.25 or 0.'5MG'$ /DOS, (OZEMPIC, 0.25 OR 0.5 MG/DOSE,) 2 MG/1.5ML SOPN; Inject 0.'25mg'$  into the skin weekly  Dispense: 1.5 mL; Refill: 0  2. Other hyperlipidemia associated with Type 2 diabetes  mellitus Cardiovascular risk and specific lipid/LDL goals reviewed.  We discussed several lifestyle modifications today. Tina Sutton will continue to work on diet, exercise, and weight loss efforts. Orders and follow up as documented in patient record.   3. Obesity with current BMI  Tina Sutton is currently in the action stage of change. As such, her goal is to continue with weight loss efforts. She has agreed to the Category 2 Plan + 100 calories.   Exercise goals: As is.  Behavioral modification strategies: increasing lean protein intake.  Tina Sutton has agreed to follow-up with our clinic in 4 weeks. She was informed of the importance of frequent follow-up visits to maximize her success with intensive lifestyle modifications for her multiple health conditions.   Objective:   Blood pressure 124/76, pulse 64, temperature 97.6 F (36.4 C), height '5\' 4"'$  (1.626 m), weight 180 lb (81.6 kg), last menstrual period 06/12/2000, SpO2 97 %. Body mass index is 30.9 kg/m.  General: Cooperative, alert, well developed, in no acute distress. HEENT: Conjunctivae and lids unremarkable. Cardiovascular: Regular rhythm.  Lungs: Normal work of breathing. Neurologic: No focal deficits.   Lab Results  Component Value Date   CREATININE 0.78 10/18/2020   BUN 18 10/18/2020   NA 144 10/18/2020   K 4.3 10/18/2020   CL 104 10/18/2020   CO2 24 10/18/2020   Lab Results  Component Value Date   ALT 22 10/18/2020   AST 22 10/18/2020   ALKPHOS 58 10/18/2020   BILITOT 0.4 10/18/2020   Lab Results  Component Value Date   HGBA1C 5.4 10/18/2020   HGBA1C 5.6 06/22/2020   HGBA1C 5.8 (H)  03/30/2020   HGBA1C 6.5 10/31/2019   Lab Results  Component Value Date   INSULIN 13.6 10/18/2020   INSULIN 17.6 06/22/2020   INSULIN 13.9 03/30/2020   INSULIN 24.9 12/09/2019   Lab Results  Component Value Date   TSH 2.260 06/22/2020   Lab Results  Component Value Date   CHOL 196 10/18/2020   HDL 65 10/18/2020   LDLCALC  114 (H) 10/18/2020   TRIG 98 10/18/2020   CHOLHDL 3.0 10/18/2020   Lab Results  Component Value Date   VD25OH 58.1 10/18/2020   VD25OH 52.6 06/22/2020   VD25OH 64.4 03/30/2020   Lab Results  Component Value Date   WBC 8.4 07/30/2020   HGB 13.1 07/30/2020   HCT 38.8 07/30/2020   MCV 89.6 07/30/2020   PLT 178 07/30/2020   No results found for: IRON, TIBC, FERRITIN  Obesity Behavioral Intervention:   Approximately 15 minutes were spent on the discussion below.  ASK: We discussed the diagnosis of obesity with Tina Sutton today and Tina Sutton agreed to give [name] permission to discuss obesity behavioral modification therapy today.  ASSESS: Tina Sutton has the diagnosis of obesity and her BMI today is 30.88. Tina Sutton is in the action stage of change.   ADVISE: Tina Sutton was educated on the multiple health risks of obesity as well as the benefit of weight loss to improve her health. She was advised of the need for long term treatment and the importance of lifestyle modifications to improve her current health and to decrease her risk of future health problems.  AGREE: Multiple dietary modification options and treatment options were discussed and Tina Sutton agreed to follow the recommendations documented in the above note.  ARRANGE: Tina Sutton was educated on the importance of frequent visits to treat obesity as outlined per CMS and USPSTF guidelines and agreed to schedule her next follow up appointment today.  Attestation Statements:   Reviewed by clinician on day of visit: allergies, medications, problem list, medical history, surgical history, family history, social history, and previous encounter notes.   I, Trixie Dredge, am acting as transcriptionist for [name], MD.  I have reviewed the above documentation for accuracy and completeness, and I agree with the above. -  [name], MD

## 2021-02-22 DIAGNOSIS — Z961 Presence of intraocular lens: Secondary | ICD-10-CM | POA: Diagnosis not present

## 2021-02-22 DIAGNOSIS — H43812 Vitreous degeneration, left eye: Secondary | ICD-10-CM | POA: Diagnosis not present

## 2021-02-22 DIAGNOSIS — H35033 Hypertensive retinopathy, bilateral: Secondary | ICD-10-CM | POA: Diagnosis not present

## 2021-03-07 ENCOUNTER — Other Ambulatory Visit: Payer: Self-pay

## 2021-03-07 ENCOUNTER — Ambulatory Visit (INDEPENDENT_AMBULATORY_CARE_PROVIDER_SITE_OTHER): Payer: Medicare HMO | Admitting: Family Medicine

## 2021-03-07 ENCOUNTER — Encounter (INDEPENDENT_AMBULATORY_CARE_PROVIDER_SITE_OTHER): Payer: Self-pay | Admitting: Family Medicine

## 2021-03-07 VITALS — BP 104/66 | HR 70 | Temp 98.1°F | Ht 64.0 in | Wt 179.0 lb

## 2021-03-07 DIAGNOSIS — R0602 Shortness of breath: Secondary | ICD-10-CM

## 2021-03-07 DIAGNOSIS — E559 Vitamin D deficiency, unspecified: Secondary | ICD-10-CM | POA: Diagnosis not present

## 2021-03-07 DIAGNOSIS — E7849 Other hyperlipidemia: Secondary | ICD-10-CM | POA: Diagnosis not present

## 2021-03-07 DIAGNOSIS — E1169 Type 2 diabetes mellitus with other specified complication: Secondary | ICD-10-CM

## 2021-03-07 DIAGNOSIS — Z6838 Body mass index (BMI) 38.0-38.9, adult: Secondary | ICD-10-CM

## 2021-03-07 MED ORDER — OZEMPIC (0.25 OR 0.5 MG/DOSE) 2 MG/1.5ML ~~LOC~~ SOPN: PEN_INJECTOR | SUBCUTANEOUS | 0 refills | Status: DC

## 2021-03-07 NOTE — Progress Notes (Signed)
Chief Complaint:   OBESITY Tina Sutton is here to discuss her progress with her obesity treatment plan along with follow-up of her obesity related diagnoses. Tina Sutton is on the Category 2 Plan + 100 calories and states she is following her eating plan approximately 90% of the time. Tina Sutton states she is doing 0 minutes 0 times per week.  Today's visit was #: 17 Starting weight: 226 lbs Starting date: 12/06/2019 Today's weight: 179 lbs Today's date: 03/07/2021 Total lbs lost to date: 78 Total lbs lost since last in-office visit: 1  Interim History: Tina Sutton has done well with weight loss and she has lost another lb. Her visceral fat remains at 12 with a goal of below 10. Her RMR has decreased significantly from while she was almost 50 lbs heavier which is expected, but her decrease was higher than anticipated.  Subjective:   1. Type 2 diabetes mellitus with other specified complication, without long-term current use of insulin ([place]) Tina Sutton started the Ozempic, and she notes feeling decreased polyphagia.  2. SOB (shortness of breath) on exertion Tina Sutton has been been active and she has done well with weight loss. It appears that this has improved her weight loss.  3. Other hyperlipidemia associated with Type 2 diabetes mellitus Tina Sutton is working on diet and she is due for labs.  4. Vitamin D deficiency Tina Sutton is due to have levels checked. She denies signs of over-replacement.  Assessment/Plan:   1. Type 2 diabetes mellitus with other specified complication, without long-term current use of insulin (HCC) We will check labs today, and we will refill Ozempic for 1 month. We will follow up at Tina Sutton's next visit in 1 month. Good blood sugar control is important to decrease the likelihood of diabetic complications such as nephropathy, neuropathy, limb loss, blindness, coronary artery disease, and death. Intensive lifestyle modification including diet, exercise and weight loss are the  first line of treatment for diabetes.   - Semaglutide,0.25 or 0.5MG/DOS, (OZEMPIC, 0.25 OR 0.5 MG/DOSE,) 2 MG/1.5ML SOPN; Inject 0.75m into the skin weekly  Dispense: 1.5 mL; Refill: 0 - CMP14+EGFR - Insulin, random - Hemoglobin A1c  2. SOB (shortness of breath) on exertion Shavontae's repeat IC was done today and it shows her RMR has decreased. Increasing exercise was discussed today in addition to her daily level. She has agreed to work on weight loss and gradually increase exercise and will continue to monitor closely.  3. Other hyperlipidemia associated with Type 2 diabetes mellitus Cardiovascular risk and specific lipid/LDL goals reviewed. We discussed several lifestyle modifications today. We will check labs today, and will follow up at her next visit. CKoreenwill continue to work on diet, exercise and weight loss efforts. Orders and follow up as documented in patient record.   - Lipid Panel With LDL/HDL Ratio  4. Vitamin D deficiency Low Vitamin D level contributes to fatigue and are associated with obesity, breast, and colon cancer. We will check labs today. Tina Sutton follow-up for routine testing of Vitamin D, at least 2-3 times per year to avoid over-replacement.  - VITAMIN D 25 Hydroxy (Vit-D Deficiency, Fractures)  5. Obesity with current BMI of 30.7 Tina Sutton is currently in the action stage of change. As such, her goal is to continue with weight loss efforts. She has agreed to the Category 2 Plan + 100 calories.   Exercise goals: Add strengthening of core and legs to increase RMR.  Behavioral modification strategies: increasing lean protein intake and increasing water intake.  Tina Sutton  has agreed to follow-up with our clinic in 4 weeks. She was informed of the importance of frequent follow-up visits to maximize her success with intensive lifestyle modifications for her multiple health conditions.   Tina Sutton was informed we would discuss her lab results at her next visit  unless there is a critical issue that needs to be addressed sooner. Tina Sutton agreed to keep her next visit at the agreed upon time to discuss these results.  Objective:   Blood pressure 104/66, pulse 70, temperature 98.1 F (36.7 C), height _0  (1.626 m), weight 179 lb (81.2 kg), last menstrual period 06/12/2000, SpO2 97 %. Body mass index is 30.73 kg/m.  General: Cooperative, alert, well developed, in no acute distress. HEENT: Conjunctivae and lids unremarkable. Cardiovascular: Regular rhythm.  Lungs: Normal work of breathing. Neurologic: No focal deficits.   Lab Results  Component Value Date   CREATININE 0.78 10/18/2020   BUN 18 10/18/2020   NA 144 10/18/2020   K 4.3 10/18/2020   CL 104 10/18/2020   CO2 24 10/18/2020   Lab Results  Component Value Date   ALT 22 10/18/2020   AST 22 10/18/2020   ALKPHOS 58 10/18/2020   BILITOT 0.4 10/18/2020   Lab Results  Component Value Date   HGBA1C 5.4 10/18/2020   HGBA1C 5.6 06/22/2020   HGBA1C 5.8 (H) 03/30/2020   HGBA1C 6.5 10/31/2019   Lab Results  Component Value Date   INSULIN 13.6 10/18/2020   INSULIN 17.6 06/22/2020   INSULIN 13.9 03/30/2020   INSULIN 24.9 12/09/2019   Lab Results  Component Value Date   TSH 2.260 06/22/2020   Lab Results  Component Value Date   CHOL 196 10/18/2020   HDL 65 10/18/2020   LDLCALC 114 (H) 10/18/2020   TRIG 98 10/18/2020   CHOLHDL 3.0 10/18/2020   Lab Results  Component Value Date   VD25OH 58.1 10/18/2020   VD25OH 52.6 06/22/2020   VD25OH 64.4 03/30/2020   Lab Results  Component Value Date   WBC 8.4 07/30/2020   HGB 13.1 07/30/2020   HCT 38.8 07/30/2020   MCV 89.6 07/30/2020   PLT 178 07/30/2020   No results found for: IRON, TIBC, FERRITIN  Obesity Behavioral Intervention:   Approximately 15 minutes were spent on the discussion below.  ASK: We discussed the diagnosis of obesity with Tina Sutton today and Tina Sutton agreed to give [name] permission to discuss obesity  behavioral modification therapy today.  ASSESS: Tina Sutton has the diagnosis of obesity and her BMI today is 30.7. Tina Sutton is in the action stage of change.   ADVISE: Tina Sutton was educated on the multiple health risks of obesity as well as the benefit of weight loss to improve her health. She was advised of the need for long term treatment and the importance of lifestyle modifications to improve her current health and to decrease her risk of future health problems.  AGREE: Multiple dietary modification options and treatment options were discussed and Tina Sutton agreed to follow the recommendations documented in the above note.  ARRANGE: Tina Sutton was educated on the importance of frequent visits to treat obesity as outlined per CMS and USPSTF guidelines and agreed to schedule her next follow up appointment today.  Attestation Statements:   Reviewed by clinician on day of visit: allergies, medications, problem list, medical history, surgical history, family history, social history, and previous encounter notes.   I, Trixie Dredge, am acting as transcriptionist for [name], MD.  I have reviewed the above documentation for accuracy and  completeness, and I agree with the above. -  [name], MD

## 2021-03-08 LAB — CMP14+EGFR
ALT: 30 IU/L (ref 0–32)
AST: 22 IU/L (ref 0–40)
Albumin/Globulin Ratio: 2.4 — ABNORMAL HIGH (ref 1.2–2.2)
Albumin: 4.6 g/dL (ref 3.8–4.8)
Alkaline Phosphatase: 62 IU/L (ref 44–121)
BUN/Creatinine Ratio: 17 (ref 12–28)
BUN: 13 mg/dL (ref 8–27)
Bilirubin Total: 0.4 mg/dL (ref 0.0–1.2)
CO2: 27 mmol/L (ref 20–29)
Calcium: 9.3 mg/dL (ref 8.7–10.3)
Chloride: 106 mmol/L (ref 96–106)
Creatinine, Ser: 0.78 mg/dL (ref 0.57–1.00)
Globulin, Total: 1.9 g/dL (ref 1.5–4.5)
Glucose: 91 mg/dL (ref 70–99)
Potassium: 4.5 mmol/L (ref 3.5–5.2)
Sodium: 145 mmol/L — ABNORMAL HIGH (ref 134–144)
Total Protein: 6.5 g/dL (ref 6.0–8.5)
eGFR: 82 mL/min/{1.73_m2} (ref >59)

## 2021-03-08 LAB — HEMOGLOBIN A1C
Est. average glucose Bld gHb Est-mCnc: 114 mg/dL
Hgb A1c MFr Bld: 5.6 % (ref 4.8–5.6)

## 2021-03-08 LAB — LIPID PANEL WITH LDL/HDL RATIO
Cholesterol, Total: 182 mg/dL (ref 100–199)
HDL: 68 mg/dL (ref >39)
LDL Chol Calc (NIH): 98 mg/dL (ref 0–99)
LDL/HDL Ratio: 1.4 ratio (ref 0.0–3.2)
Triglycerides: 88 mg/dL (ref 0–149)
VLDL Cholesterol Cal: 16 mg/dL (ref 5–40)

## 2021-03-08 LAB — VITAMIN D 25 HYDROXY (VIT D DEFICIENCY, FRACTURES): Vit D, 25-Hydroxy: 60.9 ng/mL (ref 30.0–100.0)

## 2021-03-08 LAB — INSULIN, RANDOM: INSULIN: 10.1 u[IU]/mL (ref 2.6–24.9)

## 2021-03-17 ENCOUNTER — Encounter (INDEPENDENT_AMBULATORY_CARE_PROVIDER_SITE_OTHER): Payer: Self-pay | Admitting: Emergency Medicine

## 2021-03-17 ENCOUNTER — Telehealth (INDEPENDENT_AMBULATORY_CARE_PROVIDER_SITE_OTHER): Payer: Self-pay

## 2021-03-17 NOTE — Telephone Encounter (Signed)
Pt called in and stated that she would like her Ozempic to be sent to [facility] on [address]. Please advise

## 2021-03-17 NOTE — Telephone Encounter (Signed)
Mychart msg has been sent to patient to see of she have enough till her next appt

## 2021-03-21 ENCOUNTER — Other Ambulatory Visit (INDEPENDENT_AMBULATORY_CARE_PROVIDER_SITE_OTHER): Payer: Self-pay | Admitting: Emergency Medicine

## 2021-03-21 DIAGNOSIS — E1169 Type 2 diabetes mellitus with other specified complication: Secondary | ICD-10-CM

## 2021-03-21 MED ORDER — OZEMPIC (0.25 OR 0.5 MG/DOSE) 2 MG/1.5ML ~~LOC~~ SOPN: PEN_INJECTOR | SUBCUTANEOUS | 0 refills | Status: DC

## 2021-03-21 NOTE — Telephone Encounter (Signed)
Ok to rf x 1

## 2021-04-04 ENCOUNTER — Encounter (INDEPENDENT_AMBULATORY_CARE_PROVIDER_SITE_OTHER): Payer: Self-pay | Admitting: Family Medicine

## 2021-04-04 ENCOUNTER — Other Ambulatory Visit: Payer: Self-pay

## 2021-04-04 ENCOUNTER — Ambulatory Visit (INDEPENDENT_AMBULATORY_CARE_PROVIDER_SITE_OTHER): Payer: Medicare HMO | Admitting: Family Medicine

## 2021-04-04 VITALS — BP 126/71 | HR 66 | Temp 97.8°F | Ht 64.0 in | Wt 176.0 lb

## 2021-04-04 DIAGNOSIS — Z6838 Body mass index (BMI) 38.0-38.9, adult: Secondary | ICD-10-CM

## 2021-04-04 DIAGNOSIS — E1169 Type 2 diabetes mellitus with other specified complication: Secondary | ICD-10-CM

## 2021-04-04 MED ORDER — BD PEN NEEDLE NANO U/F 32G X 4 MM MISC: 0 refills | Status: DC

## 2021-04-04 MED ORDER — OZEMPIC (0.25 OR 0.5 MG/DOSE) 2 MG/1.5ML ~~LOC~~ SOPN: PEN_INJECTOR | SUBCUTANEOUS | 0 refills | Status: DC

## 2021-04-04 NOTE — Progress Notes (Signed)
Chief Complaint:   OBESITY Tina Sutton is here to discuss her progress with her obesity treatment plan along with follow-up of her obesity related diagnoses. Tina Sutton is on the Category 2 Plan + 100 calories and states she is following her eating plan approximately 80% of the time. Tina Sutton states she is doing physical activity.    Today's visit was #: 18 Starting weight: 226 lbs Starting date: 12/06/2019 Today's weight: 176 lbs Today's date: 04/04/2021 Total lbs lost to date: 50 Total lbs lost since last in-office visit: 3  Interim History: Tina Sutton continues to do well with weight loss. She is working on increasing lean protein and staying active, but no formal exercise currently.  Subjective:   1. Type 2 diabetes mellitus with other specified complication, without long-term current use of insulin (HCC) Tina Sutton continues to do well with diet and weight loss. Her recent A1c was very well controlled at 5.6. She is tolerating Ozempic.  Assessment/Plan:   1. Type 2 diabetes mellitus with other specified complication, without long-term current use of insulin (Tina Sutton) Tina Sutton agreed to increase Ozempic to 0.5 mg q weekly with no refills (she is ok to stay at 0.25 mg for now), and we will refill nano needles #30 with no refills. Good blood sugar control is important to decrease the likelihood of diabetic complications such as nephropathy, neuropathy, limb loss, blindness, coronary artery disease, and death. Intensive lifestyle modification including diet, exercise and weight loss are the first line of treatment for diabetes.   - Insulin Pen Needle (BD PEN NEEDLE NANO U/F) 32G X 4 MM MISC; Use with Saxenda.  Dispense: 30 each; Refill: 0 - Semaglutide,0.25 or 0.5MG /DOS, (OZEMPIC, 0.25 OR 0.5 MG/DOSE,) 2 MG/1.5ML SOPN; Inject 0.5 mg into the skin weekly  Dispense: 1.5 mL; Refill: 0  2. Obesity with current BMI of 30.2 Tina Sutton is currently in the action stage of change. As such, her goal is to  continue with weight loss efforts. She has agreed to the Category 2 Plan.   Exercise goals: Add strengthening exercises.  Behavioral modification strategies: increasing lean protein intake.  Tina Sutton has agreed to follow-up with our clinic in 6 to 8 weeks. She was informed of the importance of frequent follow-up visits to maximize her success with intensive lifestyle modifications for her multiple health conditions.   Objective:   Blood pressure 126/71, pulse 66, temperature 97.8 F (36.6 C), height 5\' 4"  (1.626 m), weight 176 lb (79.8 kg), last menstrual period 06/12/2000, SpO2 100 %. Body mass index is 30.21 kg/m.  General: Cooperative, alert, well developed, in no acute distress. HEENT: Conjunctivae and lids unremarkable. Cardiovascular: Regular rhythm.  Lungs: Normal work of breathing. Neurologic: No focal deficits.   Lab Results  Component Value Date   CREATININE 0.78 03/07/2021   BUN 13 03/07/2021   NA 145 (H) 03/07/2021   K 4.5 03/07/2021   CL 106 03/07/2021   CO2 27 03/07/2021   Lab Results  Component Value Date   ALT 30 03/07/2021   AST 22 03/07/2021   ALKPHOS 62 03/07/2021   BILITOT 0.4 03/07/2021   Lab Results  Component Value Date   HGBA1C 5.6 03/07/2021   HGBA1C 5.4 10/18/2020   HGBA1C 5.6 06/22/2020   HGBA1C 5.8 (H) 03/30/2020   HGBA1C 6.5 10/31/2019   Lab Results  Component Value Date   INSULIN 10.1 03/07/2021   INSULIN 13.6 10/18/2020   INSULIN 17.6 06/22/2020   INSULIN 13.9 03/30/2020   INSULIN 24.9 12/09/2019   Lab  Results  Component Value Date   TSH 2.260 06/22/2020   Lab Results  Component Value Date   CHOL 182 03/07/2021   HDL 68 03/07/2021   LDLCALC 98 03/07/2021   TRIG 88 03/07/2021   CHOLHDL 3.0 10/18/2020   Lab Results  Component Value Date   VD25OH 60.9 03/07/2021   VD25OH 58.1 10/18/2020   VD25OH 52.6 06/22/2020   Lab Results  Component Value Date   WBC 8.4 07/30/2020   HGB 13.1 07/30/2020   HCT 38.8 07/30/2020    MCV 89.6 07/30/2020   PLT 178 07/30/2020   No results found for: IRON, TIBC, FERRITIN  Attestation Statements:   Reviewed by clinician on day of visit: allergies, medications, problem list, medical history, surgical history, family history, social history, and previous encounter notes.  Time spent on visit including pre-visit chart review and post-visit care and charting was 32 minutes.    I, Trixie Dredge, am acting as transcriptionist for [name], MD.  I have reviewed the above documentation for accuracy and completeness, and I agree with the above. -  [name], MD

## 2021-05-16 ENCOUNTER — Ambulatory Visit (INDEPENDENT_AMBULATORY_CARE_PROVIDER_SITE_OTHER): Payer: Medicare HMO | Admitting: Family Medicine

## 2021-05-16 ENCOUNTER — Other Ambulatory Visit: Payer: Self-pay

## 2021-05-16 ENCOUNTER — Encounter (INDEPENDENT_AMBULATORY_CARE_PROVIDER_SITE_OTHER): Payer: Self-pay | Admitting: Family Medicine

## 2021-05-16 VITALS — BP 112/66 | HR 84 | Temp 98.1°F | Ht 64.0 in | Wt 174.0 lb

## 2021-05-16 DIAGNOSIS — Z6838 Body mass index (BMI) 38.0-38.9, adult: Secondary | ICD-10-CM | POA: Diagnosis not present

## 2021-05-16 DIAGNOSIS — E1169 Type 2 diabetes mellitus with other specified complication: Secondary | ICD-10-CM

## 2021-05-16 NOTE — Progress Notes (Signed)
Chief Complaint:   OBESITY Tina Sutton is here to discuss her progress with her obesity treatment plan along with follow-up of her obesity related diagnoses. Tina Sutton is on the Category 2 Plan and states she is following her eating plan approximately 80% of the time. Darneisha states she is walking on the treadmill for 15 minutes 1 time per week.  Today's visit was #: 61 Starting weight: 226 lbs Starting date: 12/06/2019 Today's weight: 174 lbs Today's date: 05/16/2021 Total lbs lost to date: 10 Total lbs lost since last in-office visit: 2  Interim History: Tina Sutton continues to do well with weight loss. She is working on increasing her exercise especially with increasing strengthening exercise. She did well with avoiding holiday weight gain. She is working on increasing her protein, but she is using protein supplements.   Subjective:   1. Type 2 diabetes mellitus with other specified complication, without long-term current use of insulin (HCC) Tina Sutton's recent A1c is very well controlled at 5.6. She is doing well on diet and weight loss, and she is doing well on her GLP-1.  Assessment/Plan:   1. Type 2 diabetes mellitus with other specified complication, without long-term current use of insulin (HCC) Tina Sutton will continue with diet and exercise, and we will recheck labs in 2 months. Good blood sugar control is important to decrease the likelihood of diabetic complications such as nephropathy, neuropathy, limb loss, blindness, coronary artery disease, and death. Intensive lifestyle modification including diet, exercise and weight loss are the first line of treatment for diabetes.   2. Class 2 severe obesity with serious comorbidity and body mass index (BMI) of 38.0 to 38.9 in adult, unspecified obesity type (HCC) Tina Sutton is currently in the action stage of change. As such, her goal is to continue with weight loss efforts. She has agreed to the Category 2 Plan.   Exercise goals: As  is.  Behavioral modification strategies: meal planning and cooking strategies and holiday eating strategies .  Tina Sutton has agreed to follow-up with our clinic in 6 weeks. She was informed of the importance of frequent follow-up visits to maximize her success with intensive lifestyle modifications for her multiple health conditions.   Objective:   Blood pressure 112/66, pulse 84, temperature 98.1 F (36.7 C), height 5\' 4"  (1.626 m), weight 174 lb (78.9 kg), last menstrual period 06/12/2000, SpO2 96 %. Body mass index is 29.87 kg/m.  General: Cooperative, alert, well developed, in no acute distress. HEENT: Conjunctivae and lids unremarkable. Cardiovascular: Regular rhythm.  Lungs: Normal work of breathing. Neurologic: No focal deficits.   Lab Results  Component Value Date   CREATININE 0.78 03/07/2021   BUN 13 03/07/2021   NA 145 (H) 03/07/2021   K 4.5 03/07/2021   CL 106 03/07/2021   CO2 27 03/07/2021   Lab Results  Component Value Date   ALT 30 03/07/2021   AST 22 03/07/2021   ALKPHOS 62 03/07/2021   BILITOT 0.4 03/07/2021   Lab Results  Component Value Date   HGBA1C 5.6 03/07/2021   HGBA1C 5.4 10/18/2020   HGBA1C 5.6 06/22/2020   HGBA1C 5.8 (H) 03/30/2020   HGBA1C 6.5 10/31/2019   Lab Results  Component Value Date   INSULIN 10.1 03/07/2021   INSULIN 13.6 10/18/2020   INSULIN 17.6 06/22/2020   INSULIN 13.9 03/30/2020   INSULIN 24.9 12/09/2019   Lab Results  Component Value Date   TSH 2.260 06/22/2020   Lab Results  Component Value Date   CHOL 182 03/07/2021  HDL 68 03/07/2021   LDLCALC 98 03/07/2021   TRIG 88 03/07/2021   CHOLHDL 3.0 10/18/2020   Lab Results  Component Value Date   VD25OH 60.9 03/07/2021   VD25OH 58.1 10/18/2020   VD25OH 52.6 06/22/2020   Lab Results  Component Value Date   WBC 8.4 07/30/2020   HGB 13.1 07/30/2020   HCT 38.8 07/30/2020   MCV 89.6 07/30/2020   PLT 178 07/30/2020   No results found for: IRON, TIBC,  FERRITIN  Attestation Statements:   Reviewed by clinician on day of visit: allergies, medications, problem list, medical history, surgical history, family history, social history, and previous encounter notes.  Time spent on visit including pre-visit chart review and post-visit care and charting was 30 minutes.    I, Trixie Dredge, am acting as transcriptionist for [name], MD.  I have reviewed the above documentation for accuracy and completeness, and I agree with the above. -  [name], MD

## 2021-06-01 DIAGNOSIS — H35033 Hypertensive retinopathy, bilateral: Secondary | ICD-10-CM | POA: Diagnosis not present

## 2021-06-01 DIAGNOSIS — Z961 Presence of intraocular lens: Secondary | ICD-10-CM | POA: Diagnosis not present

## 2021-06-01 DIAGNOSIS — H26493 Other secondary cataract, bilateral: Secondary | ICD-10-CM | POA: Diagnosis not present

## 2021-06-01 DIAGNOSIS — H43812 Vitreous degeneration, left eye: Secondary | ICD-10-CM | POA: Diagnosis not present

## 2021-06-20 ENCOUNTER — Other Ambulatory Visit: Payer: Self-pay | Admitting: Obstetrics and Gynecology

## 2021-06-20 DIAGNOSIS — Z1231 Encounter for screening mammogram for malignant neoplasm of breast: Secondary | ICD-10-CM

## 2021-07-04 ENCOUNTER — Other Ambulatory Visit: Payer: Self-pay

## 2021-07-04 ENCOUNTER — Encounter (INDEPENDENT_AMBULATORY_CARE_PROVIDER_SITE_OTHER): Payer: Self-pay | Admitting: Family Medicine

## 2021-07-04 ENCOUNTER — Ambulatory Visit (INDEPENDENT_AMBULATORY_CARE_PROVIDER_SITE_OTHER): Payer: Medicare HMO | Admitting: Family Medicine

## 2021-07-04 VITALS — BP 126/72 | HR 86 | Temp 97.8°F | Ht 64.0 in | Wt 175.0 lb

## 2021-07-04 DIAGNOSIS — E559 Vitamin D deficiency, unspecified: Secondary | ICD-10-CM | POA: Diagnosis not present

## 2021-07-04 DIAGNOSIS — E1169 Type 2 diabetes mellitus with other specified complication: Secondary | ICD-10-CM | POA: Diagnosis not present

## 2021-07-04 DIAGNOSIS — E785 Hyperlipidemia, unspecified: Secondary | ICD-10-CM | POA: Diagnosis not present

## 2021-07-04 DIAGNOSIS — E7849 Other hyperlipidemia: Secondary | ICD-10-CM

## 2021-07-04 DIAGNOSIS — Z6838 Body mass index (BMI) 38.0-38.9, adult: Secondary | ICD-10-CM

## 2021-07-04 DIAGNOSIS — E669 Obesity, unspecified: Secondary | ICD-10-CM | POA: Diagnosis not present

## 2021-07-04 DIAGNOSIS — Z6830 Body mass index (BMI) 30.0-30.9, adult: Secondary | ICD-10-CM | POA: Diagnosis not present

## 2021-07-04 MED ORDER — OZEMPIC (0.25 OR 0.5 MG/DOSE) 2 MG/1.5ML ~~LOC~~ SOPN: PEN_INJECTOR | SUBCUTANEOUS | 0 refills | Status: DC

## 2021-07-04 MED ORDER — BD PEN NEEDLE NANO U/F 32G X 4 MM MISC: 0 refills | Status: DC

## 2021-07-04 NOTE — Progress Notes (Signed)
Chief Complaint:   OBESITY Tina Sutton is here to discuss her progress with her obesity treatment plan along with follow-up of her obesity related diagnoses. Tina Sutton is on the Category 2 Plan and states she is following her eating plan approximately 90% of the time. Tina Sutton states she is doing 0 minutes 0 times per week.  Today's visit was #: 20 Starting weight: 226 lbs Starting date: 12/06/2019 Today's weight: 175 lbs Today's date: 07/04/2021 Total lbs lost to date: 51 Total lbs lost since last in-office visit: 0  Interim History: Tina Sutton has done well with maintaining her weight loss, but she would like to get back to losing weight. She is open to changing meal plans.   Subjective:   1. Type 2 diabetes mellitus with other specified complication, without long-term current use of insulin (Tina Sutton) Tina Sutton has done well with diet, and she has no problems with Ozempic. She wonders if she needs it any longer.  2. Vitamin D deficiency Tina Sutton is stable on Vitamin D OTC, with no side effects noted.  3. Other hyperlipidemia Tina Sutton is stable on Zocor, and she denies chest pain or myalgias. She is doing well with diet and she is due for labs.  Assessment/Plan:   1. Type 2 diabetes mellitus with other specified complication, without long-term current use of insulin ([facility]) We will check labs today, and we will refill Ozempic for 1 month with pen needles #100 with no refills. We will discuss changing dose or discontinuing Ozempic in 6 weeks. Good blood sugar control is important to decrease the likelihood of diabetic complications such as nephropathy, neuropathy, limb loss, blindness, coronary artery disease, and death. Intensive lifestyle modification including diet, exercise and weight loss are the first line of treatment for diabetes.   - CMP14+EGFR - Semaglutide,0.25 or 0.5MG/DOS, (OZEMPIC, 0.25 OR 0.5 MG/DOSE,) 2 MG/1.5ML SOPN; Inject 0.5 mg into the skin weekly  Dispense: 1.5 mL; Refill:  0 - Insulin Pen Needle (BD PEN NEEDLE NANO U/F) 32G X 4 MM MISC; Use with Saxenda.  Dispense: 100 each; Refill: 0  2. Vitamin D deficiency Low Vitamin D level contributes to fatigue and are associated with obesity, breast, and colon cancer. We will check labs today, and Tina Sutton will continue OTC Vitamin D and will follow-up for routine testing of Vitamin D, at least 2-3 times per year to avoid over-replacement.  - VITAMIN D 25 Hydroxy (Vit-D Deficiency, Fractures)  3. Other hyperlipidemia Cardiovascular risk and specific lipid/LDL goals reviewed. We discussed several lifestyle modifications today. We will check labs today. Tina Sutton will continue to work on diet, exercise and weight loss efforts. Orders and follow up as documented in patient record.   - Lipid Panel With LDL/HDL Ratio - Insulin, random - Hemoglobin A1c  4. Obesity, current BMI 30.0 Tina Sutton is currently in the action stage of change. As such, her goal is to continue with weight loss efforts. She has agreed to change to following a lower carbohydrate, vegetable and lean protein rich diet plan.   Exercise goals: For substantial health benefits, adults should do at least 150 minutes (2 hours and 30 minutes) a week of moderate-intensity, or 75 minutes (1 hour and 15 minutes) a week of vigorous-intensity aerobic physical activity, or an equivalent combination of moderate- and vigorous-intensity aerobic activity. Aerobic activity should be performed in episodes of at least 10 minutes, and preferably, it should be spread throughout the week.  Behavioral modification strategies: decreasing simple carbohydrates and meal planning and cooking strategies.  Tina Sutton has agreed to follow-up with our clinic in 6 weeks. She was informed of the importance of frequent follow-up visits to maximize her success with intensive lifestyle modifications for her multiple health conditions.   Tina Sutton was informed we would discuss her lab results at her next  visit unless there is a critical issue that needs to be addressed sooner. Tina Sutton agreed to keep her next visit at the agreed upon time to discuss these results.  Objective:   Blood pressure 126/72, pulse 86, temperature 97.8 F (36.6 C), height 5' 4" (1.626 m), weight 175 lb (79.4 kg), last menstrual period 06/12/2000, SpO2 97 %. Body mass index is 30.04 kg/m.  General: Cooperative, alert, well developed, in no acute distress. HEENT: Conjunctivae and lids unremarkable. Cardiovascular: Regular rhythm.  Lungs: Normal work of breathing. Neurologic: No focal deficits.   Lab Results  Component Value Date   CREATININE 0.78 03/07/2021   BUN 13 03/07/2021   NA 145 (H) 03/07/2021   K 4.5 03/07/2021   CL 106 03/07/2021   CO2 27 03/07/2021   Lab Results  Component Value Date   ALT 30 03/07/2021   AST 22 03/07/2021   ALKPHOS 62 03/07/2021   BILITOT 0.4 03/07/2021   Lab Results  Component Value Date   HGBA1C 5.6 03/07/2021   HGBA1C 5.4 10/18/2020   HGBA1C 5.6 06/22/2020   HGBA1C 5.8 (H) 03/30/2020   HGBA1C 6.5 10/31/2019   Lab Results  Component Value Date   INSULIN 10.1 03/07/2021   INSULIN 13.6 10/18/2020   INSULIN 17.6 06/22/2020   INSULIN 13.9 03/30/2020   INSULIN 24.9 12/09/2019   Lab Results  Component Value Date   TSH 2.260 06/22/2020   Lab Results  Component Value Date   CHOL 182 03/07/2021   HDL 68 03/07/2021   LDLCALC 98 03/07/2021   TRIG 88 03/07/2021   CHOLHDL 3.0 10/18/2020   Lab Results  Component Value Date   VD25OH 60.9 03/07/2021   VD25OH 58.1 10/18/2020   VD25OH 52.6 06/22/2020   Lab Results  Component Value Date   WBC 8.4 07/30/2020   HGB 13.1 07/30/2020   HCT 38.8 07/30/2020   MCV 89.6 07/30/2020   PLT 178 07/30/2020   No results found for: IRON, TIBC, FERRITIN  Obesity Behavioral Intervention:   Approximately 15 minutes were spent on the discussion below.  ASK: We discussed the diagnosis of obesity with Tina Sutton today and Tina Sutton  agreed to give [name] permission to discuss obesity behavioral modification therapy today.  ASSESS: Tina Sutton has the diagnosis of obesity and her BMI today is 30.0. Tina Sutton is in the action stage of change.   ADVISE: Tina Sutton was educated on the multiple health risks of obesity as well as the benefit of weight loss to improve her health. She was advised of the need for long term treatment and the importance of lifestyle modifications to improve her current health and to decrease her risk of future health problems.  AGREE: Multiple dietary modification options and treatment options were discussed and Tina Sutton agreed to follow the recommendations documented in the above note.  ARRANGE: Tina Sutton was educated on the importance of frequent visits to treat obesity as outlined per CMS and USPSTF guidelines and agreed to schedule her next follow up appointment today.  Attestation Statements:   Reviewed by clinician on day of visit: allergies, medications, problem list, medical history, surgical history, family history, social history, and previous encounter notes.   Wilhemena Durie, am acting as Location manager for [employer],  MD.  I have reviewed the above documentation for accuracy and completeness, and I agree with the above. -  [name], MD

## 2021-07-06 LAB — CMP14+EGFR
ALT: 29 IU/L (ref 0–32)
AST: 19 IU/L (ref 0–40)
Albumin/Globulin Ratio: 2.4 — ABNORMAL HIGH (ref 1.2–2.2)
Albumin: 4.7 g/dL (ref 3.8–4.8)
Alkaline Phosphatase: 66 IU/L (ref 44–121)
BUN/Creatinine Ratio: 25 (ref 12–28)
BUN: 21 mg/dL (ref 8–27)
Bilirubin Total: 0.3 mg/dL (ref 0.0–1.2)
CO2: 24 mmol/L (ref 20–29)
Calcium: 9.6 mg/dL (ref 8.7–10.3)
Chloride: 103 mmol/L (ref 96–106)
Creatinine, Ser: 0.83 mg/dL (ref 0.57–1.00)
Globulin, Total: 2 g/dL (ref 1.5–4.5)
Glucose: 86 mg/dL (ref 70–99)
Potassium: 4.7 mmol/L (ref 3.5–5.2)
Sodium: 143 mmol/L (ref 134–144)
Total Protein: 6.7 g/dL (ref 6.0–8.5)
eGFR: 76 mL/min/{1.73_m2} (ref >59)

## 2021-07-06 LAB — LIPID PANEL WITH LDL/HDL RATIO
Cholesterol, Total: 199 mg/dL (ref 100–199)
HDL: 77 mg/dL (ref >39)
LDL Chol Calc (NIH): 109 mg/dL — ABNORMAL HIGH (ref 0–99)
LDL/HDL Ratio: 1.4 ratio (ref 0.0–3.2)
Triglycerides: 70 mg/dL (ref 0–149)
VLDL Cholesterol Cal: 13 mg/dL (ref 5–40)

## 2021-07-06 LAB — HEMOGLOBIN A1C
Est. average glucose Bld gHb Est-mCnc: 100 mg/dL
Hgb A1c MFr Bld: 5.1 % (ref 4.8–5.6)

## 2021-07-06 LAB — VITAMIN D 25 HYDROXY (VIT D DEFICIENCY, FRACTURES): Vit D, 25-Hydroxy: 65.4 ng/mL (ref 30.0–100.0)

## 2021-07-06 LAB — INSULIN, RANDOM: INSULIN: 8.8 u[IU]/mL (ref 2.6–24.9)

## 2021-07-29 ENCOUNTER — Ambulatory Visit
Admission: RE | Admit: 2021-07-29 | Discharge: 2021-07-29 | Disposition: A | Discharge status: Home / Self Care | Payer: Medicare HMO | Source: Ambulatory Visit | Attending: Obstetrics and Gynecology | Admitting: Obstetrics and Gynecology

## 2021-07-29 DIAGNOSIS — Z1231 Encounter for screening mammogram for malignant neoplasm of breast: Secondary | ICD-10-CM

## 2021-08-15 ENCOUNTER — Other Ambulatory Visit: Payer: Self-pay

## 2021-08-15 ENCOUNTER — Ambulatory Visit (INDEPENDENT_AMBULATORY_CARE_PROVIDER_SITE_OTHER): Payer: Medicare HMO | Admitting: Family Medicine

## 2021-08-15 ENCOUNTER — Encounter (INDEPENDENT_AMBULATORY_CARE_PROVIDER_SITE_OTHER): Payer: Self-pay | Admitting: Family Medicine

## 2021-08-15 VITALS — BP 104/61 | HR 66 | Temp 98.2°F | Ht 64.0 in | Wt 173.0 lb

## 2021-08-15 DIAGNOSIS — E1169 Type 2 diabetes mellitus with other specified complication: Secondary | ICD-10-CM | POA: Diagnosis not present

## 2021-08-15 DIAGNOSIS — E559 Vitamin D deficiency, unspecified: Secondary | ICD-10-CM

## 2021-08-15 DIAGNOSIS — E669 Obesity, unspecified: Secondary | ICD-10-CM | POA: Diagnosis not present

## 2021-08-15 DIAGNOSIS — Z6829 Body mass index (BMI) 29.0-29.9, adult: Secondary | ICD-10-CM | POA: Diagnosis not present

## 2021-08-15 DIAGNOSIS — Z7985 Long-term (current) use of injectable non-insulin antidiabetic drugs: Secondary | ICD-10-CM

## 2021-08-15 MED ORDER — OZEMPIC (0.25 OR 0.5 MG/DOSE) 2 MG/1.5ML ~~LOC~~ SOPN: PEN_INJECTOR | SUBCUTANEOUS | 0 refills | Status: DC

## 2021-08-17 NOTE — Progress Notes (Signed)
? ? ? ?Chief Complaint:  ? ?OBESITY ?Tina Sutton is here to discuss her progress with her obesity treatment plan along with follow-up of her obesity related diagnoses. Tina Sutton is on the Category 2 Plan and states she is following her eating plan approximately 90% of the time. Tina Sutton states she is doing 0 minutes 0 times per week. ? ?Today's visit was #: 21 ?Starting weight: 226 lbs ?Starting date: 12/06/2019 ?Today's weight: 173 lbs ?Today's date: 08/15/2021 ?Total lbs lost to date: 42 ?Total lbs lost since last in-office visit: 2 ? ?Interim History: Tina Sutton continues to do well with weight loss. She is working on wearing ankle weights during the day. She struggles with some cravings but is working on meal planning, and is very active with helping friends move and de-cluttering her home.  ? ?Subjective:  ? ?1. Type 2 diabetes mellitus with other specified complication, without long-term current use of insulin ([place]) ?Tina Sutton is stable on Ozempic, and her A1c, insulin, and glucose are all at goal. I discussed labs with the patient today. ? ?2. Vitamin D deficiency ?Tina Sutton's Vit D level is at goal. She is on OTC Vit D, with no side effects noted. I discussed labs with the patient today. ? ?Assessment/Plan:  ? ?1. Type 2 diabetes mellitus with other specified complication, without long-term current use of insulin ([place]) ?Tina Sutton will continue Ozempic, and we will refill for 2 months. Good blood sugar control is important to decrease the likelihood of diabetic complications such as nephropathy, neuropathy, limb loss, blindness, coronary artery disease, and death. Intensive lifestyle modification including diet, exercise and weight loss are the first line of treatment for diabetes.  ? ?- Semaglutide,0.25 or 0.'5MG'$ /DOS, (OZEMPIC, 0.25 OR 0.5 MG/DOSE,) 2 MG/1.5ML SOPN; Inject 0.5 mg into the skin weekly  Dispense: 6 mL; Refill: 0 ? ?2. Vitamin D deficiency ?Tina Sutton will continue OTC Vitamin D as is, and we will continue to follow  and activity manage her medications. She will follow-up for routine testing of Vitamin D, at least 2-3 times per year to avoid over-replacement. ? ?3. Obesity, with current BMI of 29.8 ?Tina Sutton is currently in the action stage of change. As such, her goal is to continue with weight loss efforts. She has agreed to the Category 2 Plan.  ? ?Exercise goals: As is. ? ?Behavioral modification strategies: increasing lean protein intake. ? ?Tina Sutton has agreed to follow-up with our clinic in 6 weeks. She was informed of the importance of frequent follow-up visits to maximize her success with intensive lifestyle modifications for her multiple health conditions.  ? ?Objective:  ? ?Blood pressure 104/61, pulse 66, temperature 98.2 ?F (36.8 ?C), height '5\' 4"'$  (1.626 m), weight 173 lb (78.5 kg), last menstrual period 06/12/2000, SpO2 96 %. ?Body mass index is 29.7 kg/m?. ? ?General: Cooperative, alert, well developed, in no acute distress. ?HEENT: Conjunctivae and lids unremarkable. ?Cardiovascular: Regular rhythm.  ?Lungs: Normal work of breathing. ?Neurologic: No focal deficits.  ? ?Lab Results  ?Component Value Date  ? CREATININE 0.83 07/04/2021  ? BUN 21 07/04/2021  ? NA 143 07/04/2021  ? K 4.7 07/04/2021  ? CL 103 07/04/2021  ? CO2 24 07/04/2021  ? ?Lab Results  ?Component Value Date  ? ALT 29 07/04/2021  ? AST 19 07/04/2021  ? ALKPHOS 66 07/04/2021  ? BILITOT 0.3 07/04/2021  ? ?Lab Results  ?Component Value Date  ? HGBA1C 5.1 07/04/2021  ? HGBA1C 5.6 03/07/2021  ? HGBA1C 5.4 10/18/2020  ? HGBA1C 5.6 06/22/2020  ?  HGBA1C 5.8 (H) 03/30/2020  ? ?Lab Results  ?Component Value Date  ? INSULIN 8.8 07/04/2021  ? INSULIN 10.1 03/07/2021  ? INSULIN 13.6 10/18/2020  ? INSULIN 17.6 06/22/2020  ? INSULIN 13.9 03/30/2020  ? ?Lab Results  ?Component Value Date  ? TSH 2.260 06/22/2020  ? ?Lab Results  ?Component Value Date  ? CHOL 199 07/04/2021  ? HDL 77 07/04/2021  ? LDLCALC 109 (H) 07/04/2021  ? TRIG 70 07/04/2021  ? CHOLHDL 3.0  10/18/2020  ? ?Lab Results  ?Component Value Date  ? VD25OH 65.4 07/04/2021  ? VD25OH 60.9 03/07/2021  ? VD25OH 58.1 10/18/2020  ? ?Lab Results  ?Component Value Date  ? WBC 8.4 07/30/2020  ? HGB 13.1 07/30/2020  ? HCT 38.8 07/30/2020  ? MCV 89.6 07/30/2020  ? PLT 178 07/30/2020  ? ?No results found for: IRON, TIBC, FERRITIN ? ?Obesity Behavioral Intervention:  ? ?Approximately 15 minutes were spent on the discussion below. ? ?ASK: ?We discussed the diagnosis of obesity with Tina Sutton today and Tina Sutton agreed to give [name] permission to discuss obesity behavioral modification therapy today. ? ?ASSESS: ?Tina Sutton has the diagnosis of obesity and her BMI today is 29.8. Tina Sutton is in the action stage of change.  ? ?Tina Sutton was educated on the multiple health risks of obesity as well as the benefit of weight loss to improve her health. She was advised of the need for long term treatment and the importance of lifestyle modifications to improve her current health and to decrease her risk of future health problems. ? ?AGREE: ?Multiple dietary modification options and treatment options were discussed and Tina Sutton agreed to follow the recommendations documented in the above note. ? ?ARRANGE: ?Tina Sutton was educated on the importance of frequent visits to treat obesity as outlined per CMS and USPSTF guidelines and agreed to schedule her next follow up appointment today. ? ?Attestation Statements:  ? ?Reviewed by clinician on day of visit: allergies, medications, problem list, medical history, surgical history, family history, social history, and previous encounter notes. ? ? ?I, Trixie Dredge, am acting as transcriptionist for [name], MD. ? ?I have reviewed the above documentation for accuracy and completeness, and I agree with the above. -  [name], MD ? ? ?

## 2021-08-23 NOTE — Progress Notes (Signed)
71 y.o. G0P0000 Single White or Caucasian Not Hispanic or Latino female here for annual exam.  H/O TAH/BSO. ? ?No bowel or bladder issues. ? ?She noticed a bump in her pubic area, not itchy.  ?  ? ?Patient's last menstrual period was 06/12/2000.          ?Sexually active: No.  ?The current method of family planning is status post hysterectomy.    ?Exercising: No.  The patient does not participate in regular exercise at present. ?Smoker:  no ? ?Health Maintenance: ?Pap:  07/02/15 [facility] - per patient normal ?History of abnormal Pap:  no ?MMG:  07/29/21 density C Bi-rads 1 neg  ?BMD:   11/26/2017 mild osteopenia, T score -1.2, FRAX 14.3/0.8% ?Colonoscopy: 11/23/2017 polyps, f/u 2024   ?TDaP:  06/25/2018 ?Gardasil: NA ? ? reports that she has never smoked. She has never used smokeless tobacco. She reports current alcohol use of about 1.0 - 5.0 standard drink per week. She reports that she does not use drugs. Retired, she was a Network engineer for [employer]. She lived in [place] for 3 years while working for [employer]. ? ?Past Medical History:  ?Diagnosis Date  ? Depression   ? Dysmenorrhea   ? Fibroid   ? Hyperlipemia   ? Hyperlipidemia   ? Hypertension   ? Inverted nipple x several yrs  ? bilateral, pt states nipples have always been inverted  ? Joint pain   ? Obesity   ? Sleep apnea   ? SOB (shortness of breath)   ? Vitamin D deficiency   ? ? ?Past Surgical History:  ?Procedure Laterality Date  ? BLEPHAROPLASTY Bilateral 02/21/2008  ? BREAST BIOPSY Right   ? BREAST EXCISIONAL BIOPSY Right   ? benign  ? PELVIC LAPAROSCOPY    ? TOTAL ABDOMINAL HYSTERECTOMY  05/04/2001  ? ? ?Current Outpatient Medications  ?Medication Sig Dispense Refill  ? aspirin EC 81 MG tablet Take 81 mg by mouth daily.    ? Calcium Carbonate-Vit D-Min (CALCIUM 1200 PO) Take by mouth daily.    ? Cholecalciferol (VITAMIN D) 50 MCG (2000 UT) CAPS Take by mouth.    ? hydrochlorothiazide (HYDRODIURIL) 12.5 MG tablet Take 12.5 mg by mouth  daily.    ? Insulin Pen Needle (BD PEN NEEDLE NANO U/F) 32G X 4 MM MISC Use with Saxenda. 100 each 0  ? losartan (COZAAR) 100 MG tablet Take 1 tablet by mouth daily.  1  ? Probiotic Product (PROBIOTIC-10 PO) Take by mouth.    ? Semaglutide,0.25 or 0.'5MG'$ /DOS, (OZEMPIC, 0.25 OR 0.5 MG/DOSE,) 2 MG/1.5ML SOPN Inject 0.5 mg into the skin weekly 6 mL 0  ? simvastatin (ZOCOR) 20 MG tablet Take 1 tablet by mouth daily.  6  ? ?No current facility-administered medications for this visit.  ? ? ?Family History  ?Problem Relation Age of Onset  ? Hypertension Mother   ? Alzheimer's disease Mother   ? Heart disease Mother   ? Hyperlipidemia Mother   ? Depression Mother   ? Obesity Mother   ? Hypertension Father   ? Diabetes Father   ? Stroke Father   ? Obesity Father   ? Breast cancer Sister   ? Hypertension Sister   ? Breast cancer Sister   ? Hypertension Sister   ? Hypertension Sister   ? ? ?Review of Systems  ?All other systems reviewed and are negative. ? ?Exam:   ?BP 130/68   Pulse 82   Ht 5' 3.5" (  1.613 m)   Wt 182 lb (82.6 kg)   LMP 06/12/2000   SpO2 100%   BMI 31.73 kg/m?   Weight change: '@WEIGHTCHANGE'$ @ Height:   Height: 5' 3.5" (161.3 cm)  ?Ht Readings from Last 3 Encounters:  ?08/31/21 5' 3.5" (1.613 m)  ?08/15/21 '5\' 4"'$  (1.626 m)  ?07/04/21 '5\' 4"'$  (1.626 m)  ? ? ?General appearance: alert, cooperative and appears stated age ?Head: Normocephalic, without obvious abnormality, atraumatic ?Neck: no adenopathy, supple, symmetrical, trachea midline and thyroid normal to inspection and palpation ?Breasts: normal appearance, no masses or tenderness, bilaterally inverted nipples (always) ?Abdomen: soft, non-tender; non distended,  no masses,  no organomegaly ?Extremities: extremities normal, atraumatic, no cyanosis or edema ?Skin: Skin color, texture, turgor normal. No rashes or lesions ?Lymph nodes: Cervical, supraclavicular, and axillary nodes normal. ?No abnormal inguinal nodes palpated ?Neurologic: Grossly  normal ? ? ?Pelvic: External genitalia:  no lesions. Her area of concern was a blackhead which was expressed. ?             Urethra:  normal appearing urethra with no masses, tenderness or lesions ?             Bartholins and Skenes: normal    ?             Vagina: normal appearing vagina with normal color and discharge, no lesions ?             Cervix: absent ?              ?Bimanual Exam:  Uterus:  uterus absent ?             Adnexa: no mass, fullness, tenderness ?              Rectovaginal: Confirms ?              Anus:  normal sphincter tone, no lesions ? ?Gae Dry chaperoned for the exam. ? ?1. GYN exam for high-risk Medicare patient (>5 sexual partners) ?Discussed breast self exam ?Mammogram UTD ?Colonoscopy next year ?Labs with primary ? ?2. Osteopenia, unspecified location ?Will plan DEXA next year ?Discussed calcium and vit D intake ? ?3. Family history of breast cancer ? ? ?

## 2021-08-31 ENCOUNTER — Other Ambulatory Visit: Payer: Self-pay

## 2021-08-31 ENCOUNTER — Encounter: Payer: Self-pay | Admitting: Obstetrics and Gynecology

## 2021-08-31 ENCOUNTER — Ambulatory Visit (INDEPENDENT_AMBULATORY_CARE_PROVIDER_SITE_OTHER): Payer: Medicare HMO | Admitting: Obstetrics and Gynecology

## 2021-08-31 VITALS — BP 130/68 | HR 82 | Ht 63.5 in | Wt 182.0 lb

## 2021-08-31 DIAGNOSIS — Z9189 Other specified personal risk factors, not elsewhere classified: Secondary | ICD-10-CM

## 2021-08-31 DIAGNOSIS — H35039 Hypertensive retinopathy, unspecified eye: Secondary | ICD-10-CM | POA: Insufficient documentation

## 2021-08-31 DIAGNOSIS — M8580 Other specified disorders of bone density and structure, unspecified site: Secondary | ICD-10-CM

## 2021-08-31 DIAGNOSIS — Z803 Family history of malignant neoplasm of breast: Secondary | ICD-10-CM

## 2021-08-31 NOTE — Addendum Note (Signed)
Addended by: Dorothy Spark on: 08/31/2021 01:54 PM ? ? Modules accepted: Level of Service ? ?

## 2021-08-31 NOTE — Patient Instructions (Signed)
Osteopenia ?Osteopenia is a loss of thickness (density) inside the bones. Another name for osteopenia is low bone mass. Mild osteopenia is a normal part of aging. It is not a disease, and it does not cause symptoms. ?However, if you have osteopenia and continue to lose bone mass, you could develop a condition that causes the bones to become thin and break more easily (osteoporosis). Osteoporosis can cause you to lose some height, have back pain, and have a stooped posture. Although osteopenia is not a disease, making changes to your lifestyle and diet can help to prevent osteopenia from developing into osteoporosis. ?What are the causes? ?Osteopenia is caused by loss of calcium in the bones. Bones are constantly changing. Old bone cells are continually being replaced with new bone cells. This process builds new bone. ?The mineral calcium is needed to build new bone and maintain bone density. Bone density is usually highest around age 29. After that, most people's bodies cannot replace all the bone they have lost with new bone. ?What increases the risk? ?You are more likely to develop this condition if: ?You are older than age 79. ?You are a woman who went through menopause early. ?You have a long illness that keeps you in bed. ?You do not get enough exercise. ?You lack certain nutrients (malnutrition). ?You have an overactive thyroid gland (hyperthyroidism). ?You use products that contain nicotine or tobacco, such as cigarettes, e-cigarettes and chewing tobacco, or you drink a lot of alcohol. ?You are taking medicines that weaken the bones, such as steroids. ?What are the signs or symptoms? ?This condition does not cause any symptoms. You may have a slightly higher risk for bone breaks (fractures), so getting fractures more easily than normal may be an indication of osteopenia. ?How is this diagnosed? ?This condition may be diagnosed based on an X-ray exam that measures bone density (dual-energy X-ray  absorptiometry, or DEXA). This test can measure bone density in your hips, spine, and wrists. ?Osteopenia has no symptoms, so this condition is usually diagnosed after a routine bone density screening test is done for osteoporosis. This routine screening is usually done for: ?Women who are age 26 or older. ?Men who are age 39 or older. ?If you have risk factors for osteopenia, you may have the screening test at an earlier age. ?How is this treated? ?Making dietary and lifestyle changes can lower your risk for osteoporosis. ?If you have severe osteopenia that is close to becoming osteoporosis, this condition can be treated with medicines and dietary supplements such as calcium and vitamin D. These supplements help to rebuild bone density. ?Follow these instructions at home: ?Eating and drinking ?Eat a diet that is high in calcium and vitamin D. ?Calcium is found in dairy products, beans, salmon, and leafy green vegetables like spinach and broccoli. ?Look for foods that have vitamin D and calcium added to them (fortified foods), such as orange juice, cereal, and bread. ? ?Lifestyle ?Do 30 minutes or more of a weight-bearing exercise every day, such as walking, jogging, or playing a sport. These types of exercises strengthen the bones. ?Do not use any products that contain nicotine or tobacco, such as cigarettes, e-cigarettes, and chewing tobacco. If you need help quitting, ask your health care provider. ?Do not drink alcohol if: ?Your health care provider tells you not to drink. ?You are pregnant, may be pregnant, or are planning to become pregnant. ?If you drink alcohol: ?Limit how much you use to: ?0-1 drink a day for women. ?0-2 drinks  a day for men. ?Be aware of how much alcohol is in your drink. In the [place] one drink equals one 12 oz bottle of beer (355 mL), one 5 oz glass of wine (148 mL), or one 1? oz glass of hard liquor (44 mL). ?General instructions ?Take over-the-counter and prescription medicines only as  told by your health care provider. These include vitamins and supplements. ?Take precautions at home to lower your risk of falling, such as: ?Keeping rooms well-lit and free of clutter, such as cords. ?Installing safety rails on stairs. ?Using rubber mats in the bathroom or other areas that are often wet or slippery. ?Keep all follow-up visits. This is important. ?Contact a health care provider if: ?You have not had a bone density screening for osteoporosis and you are: ?A woman who is age 45 or older. ?A man who is age 33 or older. ?You are a postmenopausal woman who has not had a bone density screening for osteoporosis. ?You are older than age 51 and you want to know if you should have bone density screening for osteoporosis. ?Summary ?Osteopenia is a loss of thickness (density) inside the bones. Another name for osteopenia is low bone mass. ?Osteopenia is not a disease, but it may increase your risk for a condition that causes the bones to become thin and break more easily (osteoporosis). ?You may be at risk for osteopenia if you are older than age 67 or if you are a woman who went through early menopause. ?Osteopenia does not cause any symptoms, but it can be diagnosed with a bone density screening test. ?Dietary and lifestyle changes are the first treatment for osteopenia. These may lower your risk for osteoporosis. ?This information is not intended to replace advice given to you by your health care provider. Make sure you discuss any questions you have with your health care provider. ?Document Revised: 11/13/2019 Document Reviewed: 11/13/2019 ?Elsevier Patient Education ? [place]. ? ?

## 2021-09-26 ENCOUNTER — Encounter (INDEPENDENT_AMBULATORY_CARE_PROVIDER_SITE_OTHER): Payer: Self-pay | Admitting: Family Medicine

## 2021-09-26 ENCOUNTER — Ambulatory Visit (INDEPENDENT_AMBULATORY_CARE_PROVIDER_SITE_OTHER): Payer: Medicare HMO | Admitting: Family Medicine

## 2021-09-26 VITALS — BP 116/67 | HR 73 | Temp 98.1°F | Ht 64.0 in | Wt 181.0 lb

## 2021-09-26 DIAGNOSIS — I10 Essential (primary) hypertension: Secondary | ICD-10-CM | POA: Diagnosis not present

## 2021-09-26 DIAGNOSIS — Z6831 Body mass index (BMI) 31.0-31.9, adult: Secondary | ICD-10-CM

## 2021-09-26 DIAGNOSIS — E669 Obesity, unspecified: Secondary | ICD-10-CM | POA: Diagnosis not present

## 2021-10-06 DIAGNOSIS — Z85828 Personal history of other malignant neoplasm of skin: Secondary | ICD-10-CM | POA: Diagnosis not present

## 2021-10-06 DIAGNOSIS — D2262 Melanocytic nevi of left upper limb, including shoulder: Secondary | ICD-10-CM | POA: Diagnosis not present

## 2021-10-06 DIAGNOSIS — D225 Melanocytic nevi of trunk: Secondary | ICD-10-CM | POA: Diagnosis not present

## 2021-10-06 DIAGNOSIS — D692 Other nonthrombocytopenic purpura: Secondary | ICD-10-CM | POA: Diagnosis not present

## 2021-10-06 DIAGNOSIS — D1801 Hemangioma of skin and subcutaneous tissue: Secondary | ICD-10-CM | POA: Diagnosis not present

## 2021-10-06 DIAGNOSIS — L570 Actinic keratosis: Secondary | ICD-10-CM | POA: Diagnosis not present

## 2021-10-06 DIAGNOSIS — L821 Other seborrheic keratosis: Secondary | ICD-10-CM | POA: Diagnosis not present

## 2021-10-06 DIAGNOSIS — L814 Other melanin hyperpigmentation: Secondary | ICD-10-CM | POA: Diagnosis not present

## 2021-10-06 NOTE — Progress Notes (Signed)
? ? ? ?Chief Complaint:  ? ?OBESITY ?Tina Sutton is here to discuss her progress with her obesity treatment plan along with follow-up of her obesity related diagnoses. [name] is on the Category 2 Plan and states she is following her eating plan approximately 80% of the time. [name] states she is doing 0 minutes 0 times per week. ? ?Today's visit was #: 18 ?Starting weight: 226 lbs ?Starting date: 12/06/2019 ?Today's weight: 181 lbs ?Today's date: 09/26/2021 ?Total lbs lost to date: 13 ?Total lbs lost since last in-office visit: 0 ? ?Interim History: [name] has been struggling more with deviating from her plan more, and has started to gain more weight but at least 1/2 of her weight gain was due to water weight.  ? ?Subjective:  ? ?1. Essential hypertension ?[name]'s blood pressure is well controlled with her medications and weight loss. She is helping to be able to decrease her medications.  ? ?Assessment/Plan:  ? ?1. Essential hypertension ?[name] is to watch her Na+ intake, and continue to keep off the weight she has lost. We will continue to monitor as she continues her lifestyle modifications. ? ?2. Obesity, with current BMI of 31.1 ?[name] is currently in the action stage of change. As such, her goal is to continue with weight loss efforts. She has agreed to the Category 2 Plan.  ? ?Behavioral modification strategies: increasing lean protein intake, increasing water intake, decreasing liquid calories, and decreasing sodium intake. ? ?[name] has agreed to follow-up with our clinic in 3 months. She was informed of the importance of frequent follow-up visits to maximize her success with intensive lifestyle modifications for her multiple health conditions.  ? ?Objective:  ? ?Blood pressure 116/67, pulse 73, temperature 98.1 ?F (36.7 ?C), height '5\' 4"'$  (1.626 m), weight 181 lb (82.1 kg), last menstrual period 06/12/2000, SpO2 98 %. ?Body mass index is 31.07 kg/m?. ? ?General: Cooperative, alert, well developed, in  no acute distress. ?HEENT: Conjunctivae and lids unremarkable. ?Cardiovascular: Regular rhythm.  ?Lungs: Normal work of breathing. ?Neurologic: No focal deficits.  ? ?Lab Results  ?Component Value Date  ? CREATININE 0.83 07/04/2021  ? BUN 21 07/04/2021  ? NA 143 07/04/2021  ? K 4.7 07/04/2021  ? CL 103 07/04/2021  ? CO2 24 07/04/2021  ? ?Lab Results  ?Component Value Date  ? ALT 29 07/04/2021  ? AST 19 07/04/2021  ? ALKPHOS 66 07/04/2021  ? BILITOT 0.3 07/04/2021  ? ?Lab Results  ?Component Value Date  ? HGBA1C 5.1 07/04/2021  ? HGBA1C 5.6 03/07/2021  ? HGBA1C 5.4 10/18/2020  ? HGBA1C 5.6 06/22/2020  ? HGBA1C 5.8 (H) 03/30/2020  ? ?Lab Results  ?Component Value Date  ? INSULIN 8.8 07/04/2021  ? INSULIN 10.1 03/07/2021  ? INSULIN 13.6 10/18/2020  ? INSULIN 17.6 06/22/2020  ? INSULIN 13.9 03/30/2020  ? ?Lab Results  ?Component Value Date  ? TSH 2.260 06/22/2020  ? ?Lab Results  ?Component Value Date  ? CHOL 199 07/04/2021  ? HDL 77 07/04/2021  ? LDLCALC 109 (H) 07/04/2021  ? TRIG 70 07/04/2021  ? CHOLHDL 3.0 10/18/2020  ? ?Lab Results  ?Component Value Date  ? VD25OH 65.4 07/04/2021  ? VD25OH 60.9 03/07/2021  ? VD25OH 58.1 10/18/2020  ? ?Lab Results  ?Component Value Date  ? WBC 8.4 07/30/2020  ? HGB 13.1 07/30/2020  ? HCT 38.8 07/30/2020  ? MCV 89.6 07/30/2020  ? PLT 178 07/30/2020  ? ?No results found for: IRON, TIBC, FERRITIN ? ?Attestation Statements:  ? ?  Reviewed by clinician on day of visit: allergies, medications, problem list, medical history, surgical history, family history, social history, and previous encounter notes. ? ?Time spent on visit including pre-visit chart review and post-visit care and charting was 30 minutes.  ? ? ?I, Trixie Dredge, am acting as transcriptionist for [name], MD. ? ?I have reviewed the above documentation for accuracy and completeness, and I agree with the above. -  [name], MD ? ? ?

## 2021-10-13 IMAGING — MG MM DIGITAL SCREENING BILAT W/ TOMO AND CAD
6 of 10 series · 6 of 30 positions shown · non-contrast
Comparison: Previous exam(s).

CLINICAL DATA: Screening.

EXAM:
DIGITAL SCREENING BILATERAL MAMMOGRAM WITH TOMOSYNTHESIS AND CAD
TECHNIQUE: Bilateral screening digital craniocaudal and mediolateral oblique
mammograms were obtained. Bilateral screening digital breast
tomosynthesis was performed. The images were evaluated with
computer-aided detection.

[L MLO synth-2D]
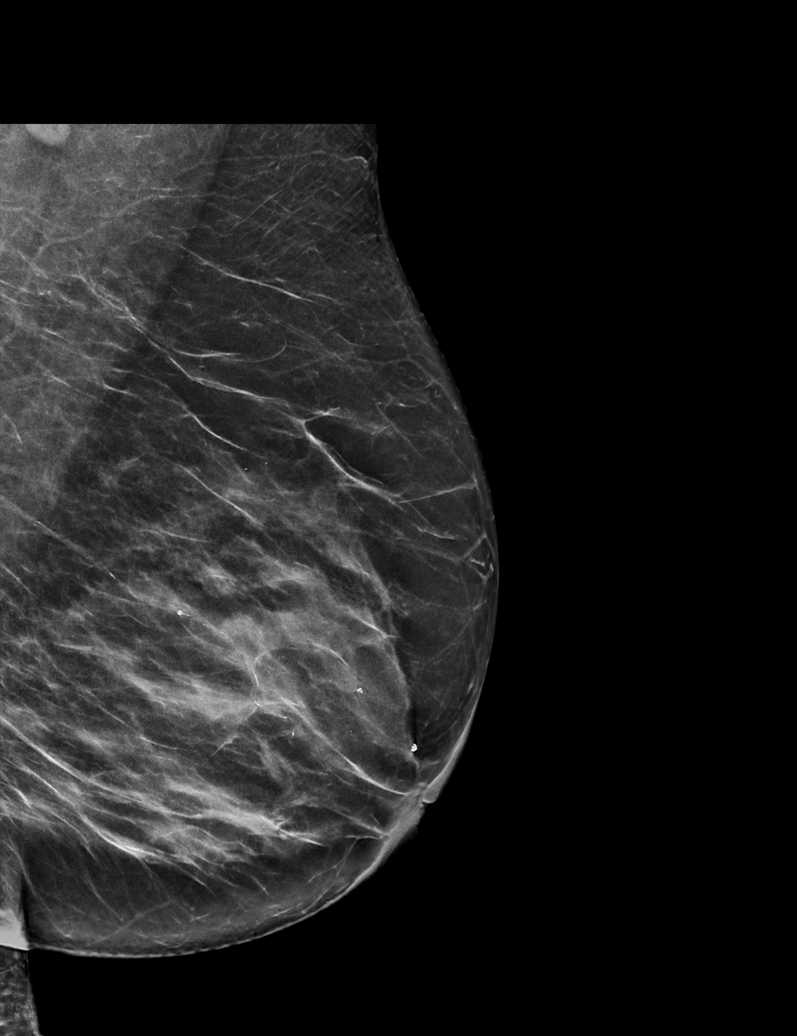

[R MLO synth-2D (1 of 2)]
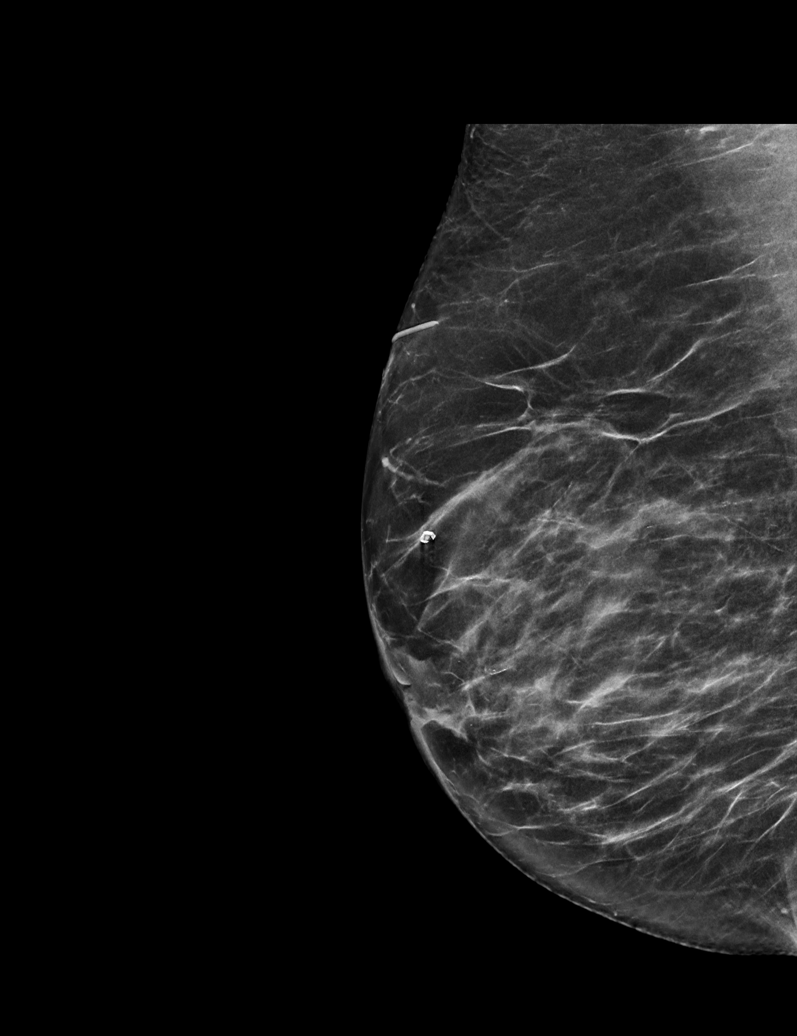

[R CC synth-2D]
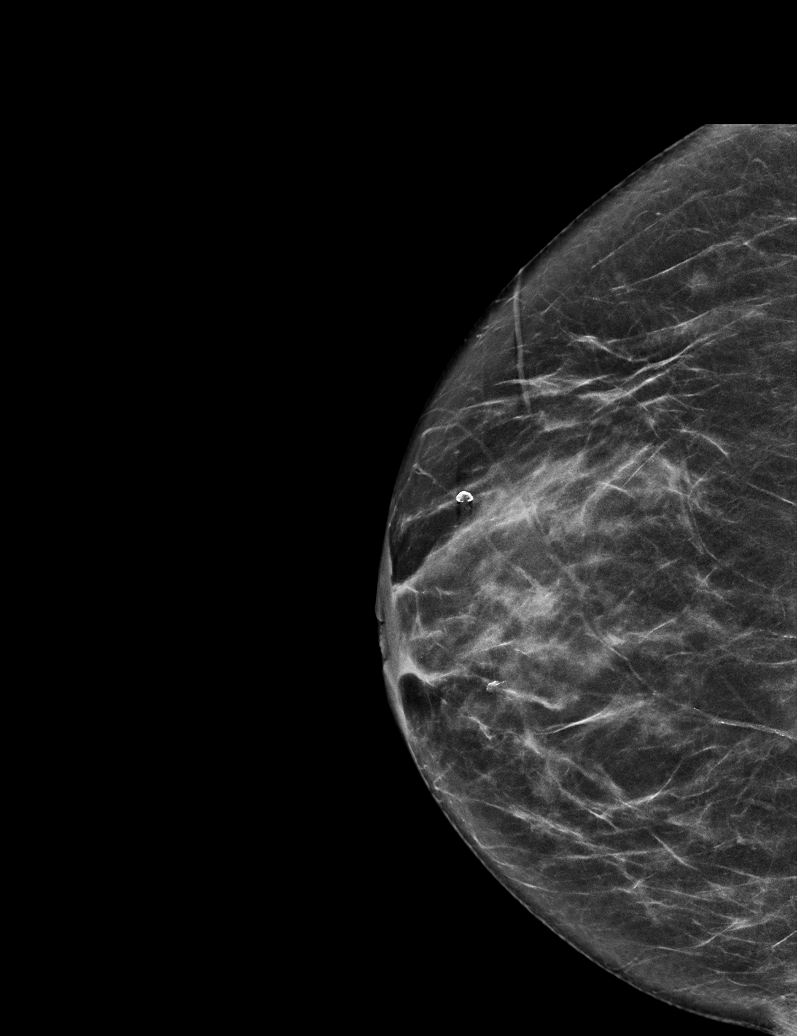

[R MLO synth-2D (2 of 2)]
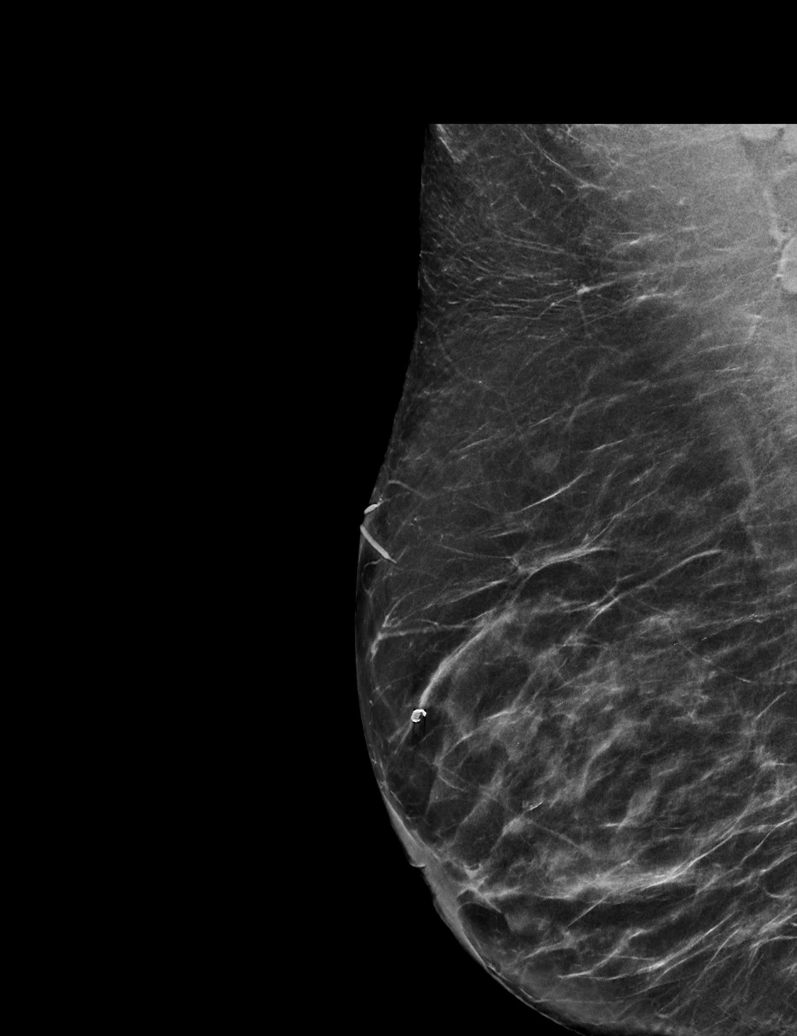

[L CC synth-2D]
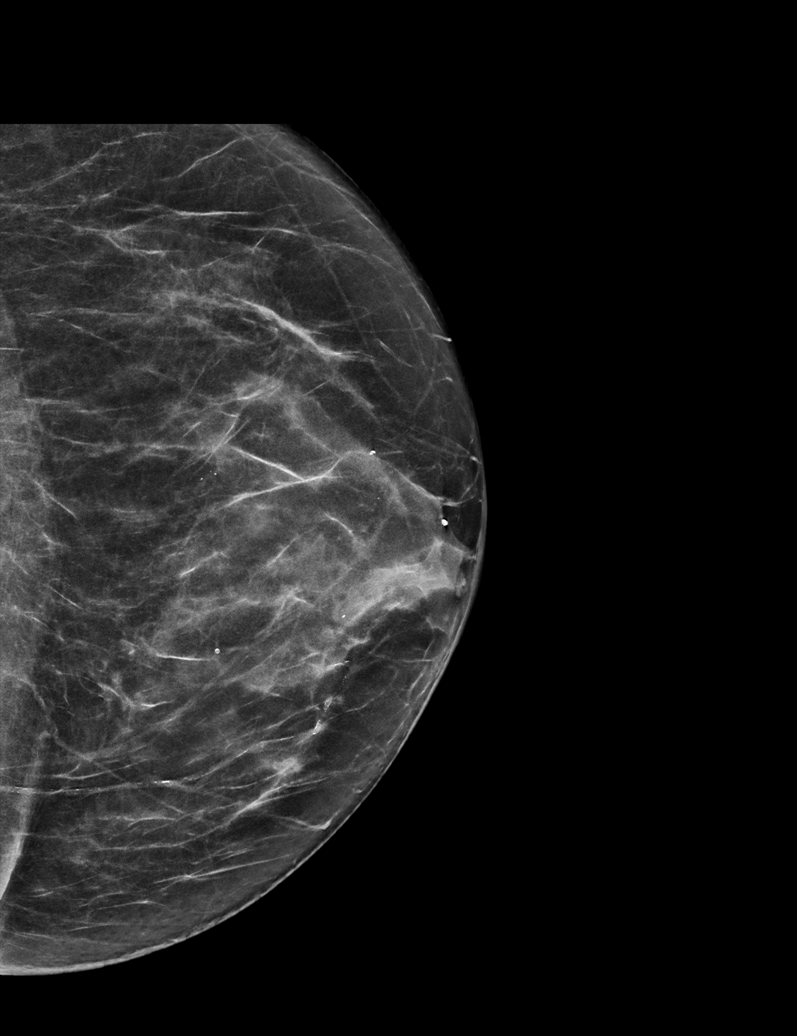

[L CC tomo · tomo slice 30/59.0]
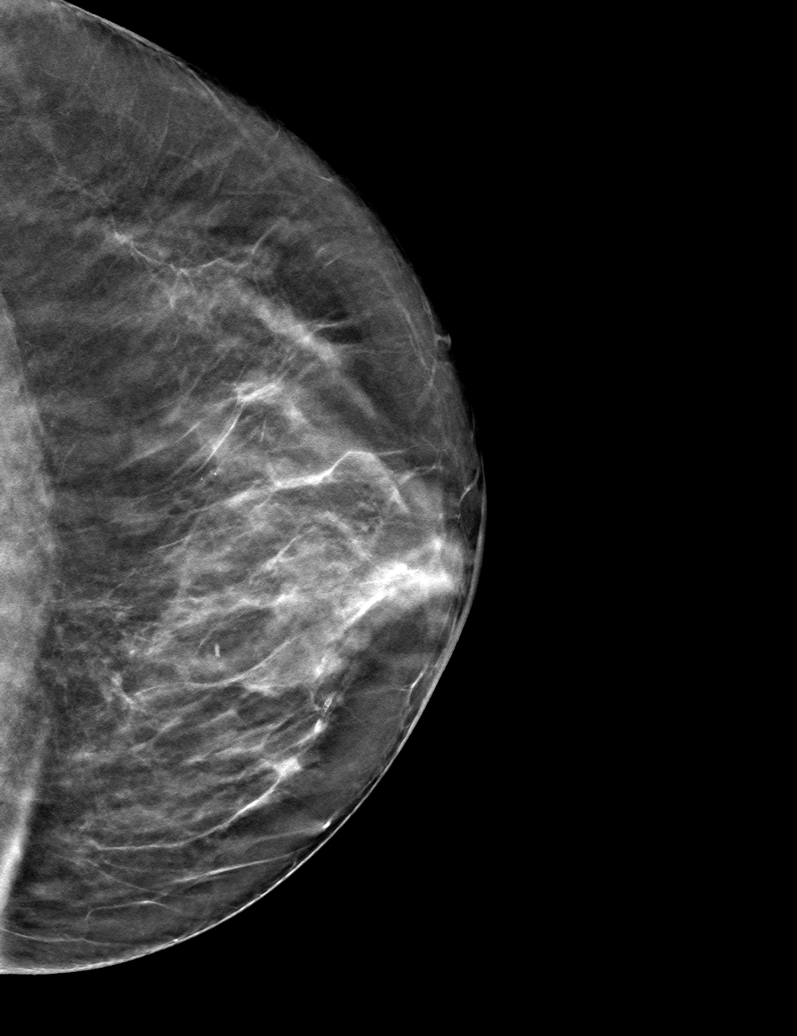

[6 of 30 positions shown; findings below may reference images not displayed]

ACR Breast Density Category c: The breast tissue is heterogeneously
dense, which may obscure small masses.
FINDINGS: There are no findings suspicious for malignancy.
IMPRESSION: No mammographic evidence of malignancy. A result letter of this
screening mammogram will be mailed directly to the patient.

RECOMMENDATION:
Screening mammogram in one year. (Code:Q3-W-BC3)

BI-RADS CATEGORY  1: Negative.

## 2021-11-04 ENCOUNTER — Ambulatory Visit
Admission: RE | Admit: 2021-11-04 | Discharge: 2021-11-04 | Disposition: A | Discharge status: Home / Self Care | Payer: Medicare HMO | Source: Ambulatory Visit | Attending: Urgent Care | Admitting: Urgent Care

## 2021-11-04 VITALS — BP 115/74 | HR 107 | Temp 99.6°F | Resp 18

## 2021-11-04 DIAGNOSIS — U071 COVID-19: Secondary | ICD-10-CM

## 2021-11-04 DIAGNOSIS — E785 Hyperlipidemia, unspecified: Secondary | ICD-10-CM

## 2021-11-04 DIAGNOSIS — E119 Type 2 diabetes mellitus without complications: Secondary | ICD-10-CM

## 2021-11-04 MED ORDER — PAXLOVID (300/100) 20 X 150 MG & 10 X 100MG PO TBPK
ORAL_TABLET | ORAL | 0 refills | Status: DC
Start: 2021-11-04 — End: 2021-11-10

## 2021-11-04 NOTE — ED Triage Notes (Signed)
Patient states she tested positive for covid this morning and is wanting a prescription for paxlovid. Patient states her symptoms began this Wednesday.

## 2021-11-04 NOTE — ED Provider Notes (Signed)
[place]   MRN: [id number] DOB: 1950-11-19  Subjective:   Tina Sutton is a 71 y.o. female presenting for a prescription for Paxlovid.  Patient tested positive for COVID-19 this morning.  She started to have symptoms that have generally been mild since Wednesday.  Denies chest pain, shortness of breath or wheezing.  She has had some body pains, right leg pain over the shin.  Has a history of type 2 diabetes treated with Ozempic.  She is also on simvastatin.  No history of CKD.  This confirmed through her blood work, last drawn January 2023.  No current facility-administered medications for this encounter.  Current Outpatient Medications:    aspirin EC 81 MG tablet, Take 81 mg by mouth daily., Disp: , Rfl:    Calcium Carbonate-Vit D-Min (CALCIUM 1200 PO), Take by mouth daily., Disp: , Rfl:    Cholecalciferol (VITAMIN D) 50 MCG (2000 UT) CAPS, Take by mouth., Disp: , Rfl:    hydrochlorothiazide (HYDRODIURIL) 12.5 MG tablet, Take 12.5 mg by mouth daily., Disp: , Rfl:    Insulin Pen Needle (BD PEN NEEDLE NANO U/F) 32G X 4 MM MISC, Use with Saxenda., Disp: 100 each, Rfl: 0   losartan (COZAAR) 100 MG tablet, Take 1 tablet by mouth daily., Disp: , Rfl: 1   Probiotic Product (PROBIOTIC-10 PO), Take by mouth., Disp: , Rfl:    Semaglutide,0.25 or 0.'5MG'$ /DOS, (OZEMPIC, 0.25 OR 0.5 MG/DOSE,) 2 MG/1.5ML SOPN, Inject 0.5 mg into the skin weekly, Disp: 6 mL, Rfl: 0   simvastatin (ZOCOR) 20 MG tablet, Take 1 tablet by mouth daily., Disp: , Rfl: 6   No Known Allergies  Past Medical History:  Diagnosis Date   Depression    Dysmenorrhea    Fibroid    Hyperlipemia    Hyperlipidemia    Hypertension    Inverted nipple x several yrs   bilateral, pt states nipples have always been inverted   Joint pain    Obesity    Sleep apnea    SOB (shortness of breath)    Vitamin D deficiency      Past Surgical History:  Procedure Laterality Date   BLEPHAROPLASTY Bilateral  02/21/2008   BREAST BIOPSY Right    BREAST EXCISIONAL BIOPSY Right    benign   PELVIC LAPAROSCOPY     TOTAL ABDOMINAL HYSTERECTOMY  05/04/2001    Family History  Problem Relation Age of Onset   Hypertension Mother    Alzheimer's disease Mother    Heart disease Mother    Hyperlipidemia Mother    Depression Mother    Obesity Mother    Hypertension Father    Diabetes Father    Stroke Father    Obesity Father    Breast cancer Sister    Hypertension Sister    Breast cancer Sister    Hypertension Sister    Hypertension Sister     Social History   Tobacco Use   Smoking status: Never   Smokeless tobacco: Never  Vaping Use   Vaping Use: Never used  Substance Use Topics   Alcohol use: Yes    Alcohol/week: 1.0 - 5.0 standard drink    Types: 1 - 5 Glasses of wine per week   Drug use: No    ROS   Objective:   Vitals: BP 115/74 (BP Location: Right Arm)   Pulse (!) 107   Temp 99.6 F (37.6 C) (Oral)   Resp 18   LMP 06/12/2000   SpO2 95%  Physical Exam Constitutional:      General: She is not in acute distress.    Appearance: Normal appearance. She is well-developed. She is not ill-appearing, toxic-appearing or diaphoretic.  HENT:     Head: Normocephalic and atraumatic.     Nose: Nose normal.     Mouth/Throat:     Mouth: Mucous membranes are moist.  Eyes:     General: No scleral icterus.       Right eye: No discharge.        Left eye: No discharge.     Extraocular Movements: Extraocular movements intact.  Cardiovascular:     Rate and Rhythm: Normal rate and regular rhythm.     Heart sounds: Normal heart sounds. No murmur heard.   No friction rub. No gallop.  Pulmonary:     Effort: Pulmonary effort is normal. No respiratory distress.     Breath sounds: No stridor. No wheezing, rhonchi or rales.  Chest:     Chest wall: No tenderness.  Skin:    General: Skin is warm and dry.  Neurological:     General: No focal deficit present.     Mental Status: She is  alert and oriented to person, place, and time.  Psychiatric:        Mood and Affect: Mood normal.        Behavior: Behavior normal.    Assessment and Plan :   PDMP not reviewed this encounter.  1. Clinical diagnosis of COVID-19   2. Type 2 diabetes mellitus treated without insulin ([place])   3. Dyslipidemia     Start Paxlovid, no history of CKD. Use supportive care otherwise. Deferred imaging given clear cardiopulmonary exam, hemodynamically stable vital signs.  She is to hold the simvastatin a week after completing Paxlovid.  Confirmation testing pending. Counseled patient on potential for adverse effects with medications prescribed/recommended today, ER and return-to-clinic precautions discussed, patient verbalized understanding.    Jaynee Eagles, PA-C 11/04/21 1213

## 2021-11-05 LAB — NOVEL CORONAVIRUS, NAA: SARS-CoV-2, NAA: DETECTED — AB

## 2021-11-08 IMAGING — DX DG KNEE AP/LAT W/ SUNRISE*R*
3 series · 3 of 3 positions shown · non-contrast
Comparison: None.

CLINICAL DATA: Chronic knee pain.

EXAM:
RIGHT KNEE 3 VIEWS

[knee ap]
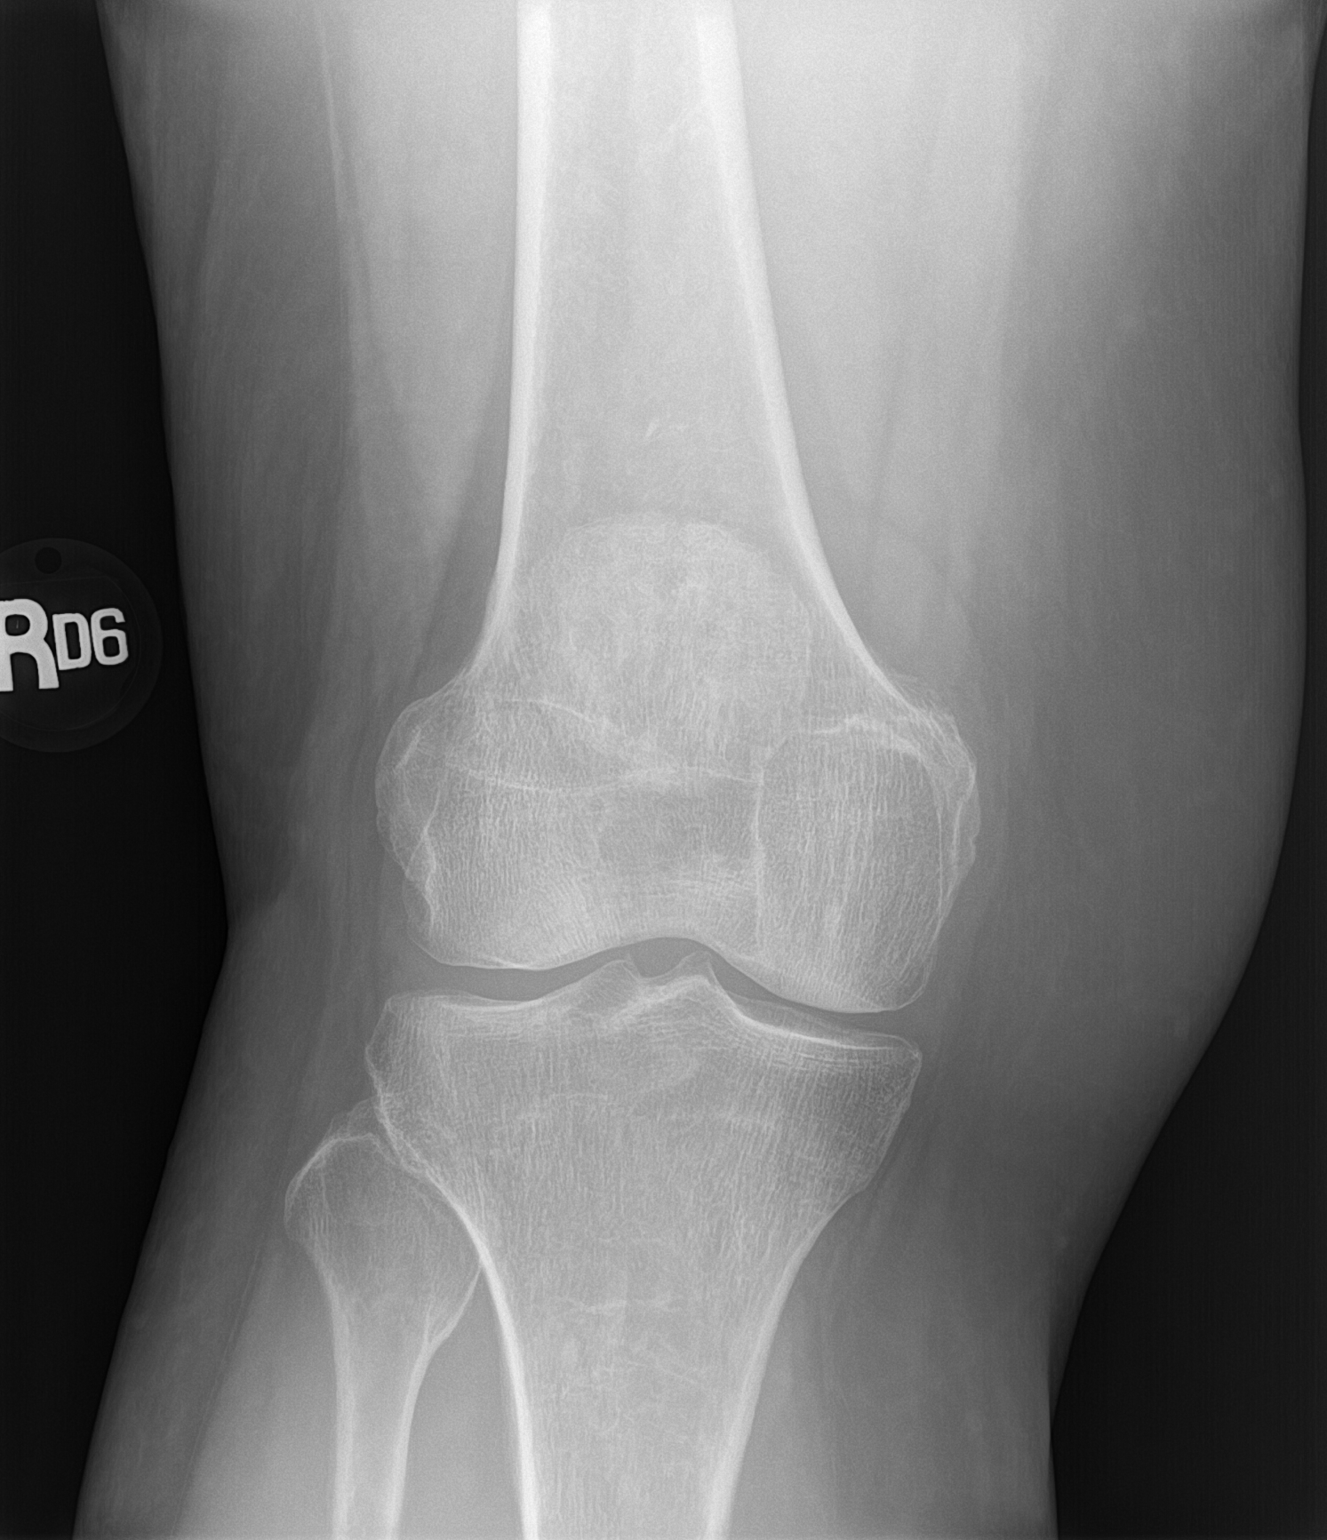

[knee lat]
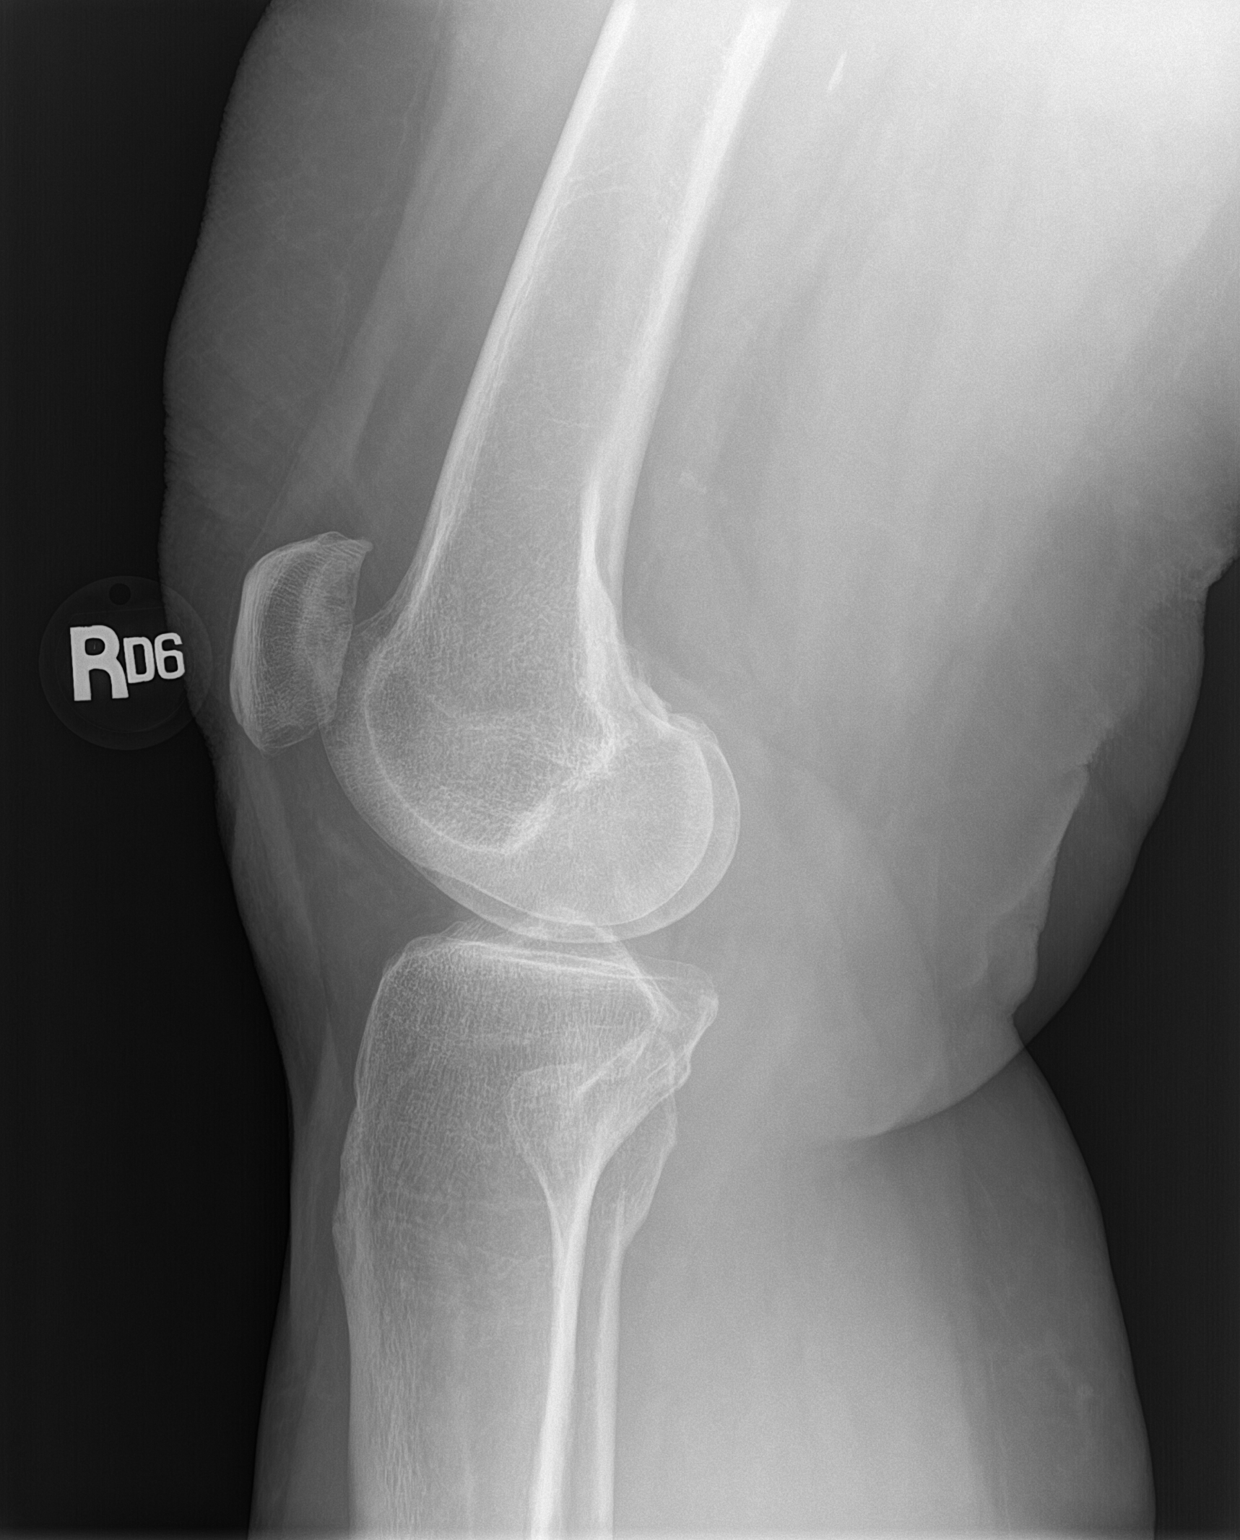

[patella]
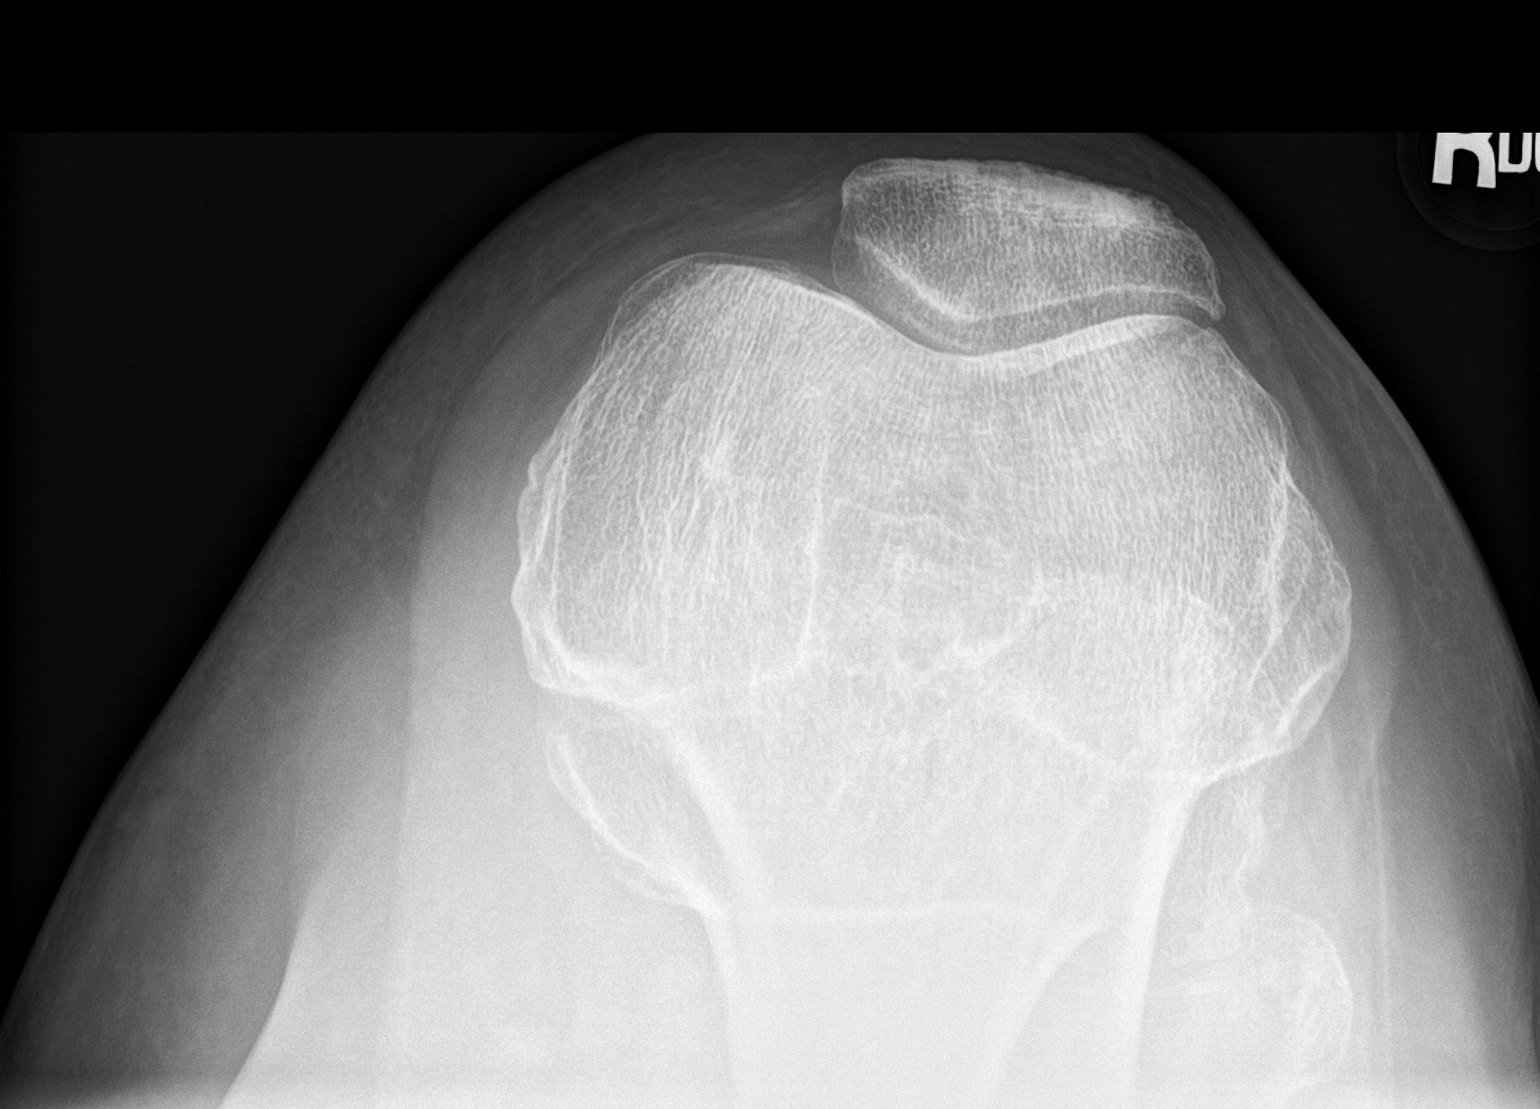

[3 of 3 positions shown; findings below may reference images not displayed]

FINDINGS: Mild tricompartmental degenerative changes are noted, greatest
within the medial and patellofemoral compartments. There is no acute
displaced fracture. No significant joint effusion. Vascular
calcifications are noted.
IMPRESSION: Mild tricompartmental degenerative changes.

## 2021-11-10 ENCOUNTER — Encounter: Payer: Self-pay | Admitting: Family Medicine

## 2021-11-10 ENCOUNTER — Ambulatory Visit (INDEPENDENT_AMBULATORY_CARE_PROVIDER_SITE_OTHER): Payer: Medicare HMO | Admitting: Family Medicine

## 2021-11-10 VITALS — BP 128/72 | HR 68 | Temp 97.8°F | Ht 63.5 in | Wt 178.0 lb

## 2021-11-10 DIAGNOSIS — R7303 Prediabetes: Secondary | ICD-10-CM

## 2021-11-10 DIAGNOSIS — Z8249 Family history of ischemic heart disease and other diseases of the circulatory system: Secondary | ICD-10-CM

## 2021-11-10 DIAGNOSIS — E7800 Pure hypercholesterolemia, unspecified: Secondary | ICD-10-CM

## 2021-11-10 DIAGNOSIS — L989 Disorder of the skin and subcutaneous tissue, unspecified: Secondary | ICD-10-CM

## 2021-11-10 DIAGNOSIS — I10 Essential (primary) hypertension: Secondary | ICD-10-CM | POA: Diagnosis not present

## 2021-11-10 NOTE — Patient Instructions (Addendum)
Welcome to [facility]! It was a pleasure meeting you today.  As discussed, Please schedule a 5 month follow up visit today.    Hold the simvastatin until after labs done  PLEASE NOTE:  If you had any LAB tests please let us know if you have not heard back within a few days. You may see your results on MyChart before we have a chance to review them but we will give you a call once they are reviewed by [name]. If we ordered any REFERRALS today, please let us know if you have not heard from their office within the next week.  Let us know through MyChart if you are needing REFILLS, or have your pharmacy send [name] the request. You can also use MyChart to communicate with me or any office staff.  Please try these tips to maintain a healthy lifestyle:  Eat most of your calories during the day when you are active. Eliminate processed foods including packaged sweets (pies, cakes, cookies), reduce intake of potatoes, white bread, white pasta, and white rice. Look for whole grain options, oat flour or almond flour.  Each meal should contain half fruits/vegetables, one quarter protein, and one quarter carbs (no bigger than a computer mouse).  Cut down on sweet beverages. This includes juice, soda, and sweet tea. Also watch fruit intake, though this is a healthier sweet option, it still contains natural sugar! Limit to 3 servings daily.  Drink at least 1 glass of water with each meal and aim for at least 8 glasses per day  Exercise at least 150 minutes every week.

## 2021-11-10 NOTE — Progress Notes (Signed)
New Patient Office Visit  Subjective:  Patient ID: Tina Sutton, female    DOB: 10-Jun-1951  Age: 71 y.o. MRN: [id number]  CC:  Chief Complaint  Patient presents with   Establish Care    Wants to discuss possibility to decrease medication list. Noticed right spot in the left side if her chest.     HPI Tina Sutton presents for new pt.  Changing doctors  Obesity-going to healthy wt and wellness.  On ozempic-lost over 40#.  Some activity. OSA-told to sleep on side-and did so ok.  Cpap didn't help anyway. HTN-Pr is on hctz 12.5 and losartan  .  Bp's running  occ checks   .  No ha/dizziness/cp/palp/edema/cough/sob.  Rare dizziness when bends over and drinks more.   Covid-just finished paxlovid-doing better.  Zocor on hold. HLD-doing ok on simvastatin-huge change in diet  wonders if needs.  Mom had CAD 47    Past Medical History:  Diagnosis Date   Depression    Dysmenorrhea    Fibroid    Hyperlipemia    Hyperlipidemia    Hypertension    Inverted nipple x several yrs   bilateral, pt states nipples have always been inverted   Joint pain    Obesity    Sleep apnea    SOB (shortness of breath)    Vitamin D deficiency     Past Surgical History:  Procedure Laterality Date   BLEPHAROPLASTY Bilateral 02/21/2008   BREAST BIOPSY Right    BREAST EXCISIONAL BIOPSY Right    benign   PELVIC LAPAROSCOPY     TOTAL ABDOMINAL HYSTERECTOMY  05/04/2001   [place] bso.  fibroids    Family History  Problem Relation Age of Onset   Hypertension Mother    Alzheimer's disease Mother    Heart disease Mother    Hyperlipidemia Mother    Depression Mother    Obesity Mother    Hypertension Father    Diabetes Father    Stroke Father    Obesity Father    Breast cancer Sister    Hypertension Sister    Breast cancer Sister    Hypertension Sister    Hypertension Sister     Social History   Socioeconomic History   Marital status: Single    Spouse name: Not on file   Number of  children: 0   Years of education: Not on file   Highest education level: Not on file  Occupational History   Occupation: retired  Tobacco Use   Smoking status: Never   Smokeless tobacco: Never  Vaping Use   Vaping Use: Never used  Substance and Sexual Activity   Alcohol use: Yes    Alcohol/week: 1.0 - 5.0 standard drink    Types: 1 - 5 Glasses of wine per week   Drug use: No   Sexual activity: Not Currently    Birth control/protection: Post-menopausal, Surgical    Comment: Hysterectomy  Other Topics Concern   Not on file  Social History Narrative   Dog   Retired Network engineer [employer]   Social Determinants of Radio broadcast assistant Strain: Not on file  Food Insecurity: Not on file  Transportation Needs: Not on file  Physical Activity: Not on file  Stress: Not on file  Social Connections: Not on file  Intimate Partner Violence: Not on file    ROS  ROS: Gen: no fever, chills  Skin: no rash, itching ENT: no ear pain, ear drainage, +chronic rhinitis Eyes: no blurry  vision, double vision Resp: no cough, wheeze,SOB CV: no CP, palpitations, LE edema,  GI: no heartburn, n/v/d/c, abd pain.  Cscope due next yr.  [facility].  GU: no dysuria, urgency, frequency, hematuria MSK: Hip R-had MRI 1 yr ago-Labral tear.  Doing better-"babies it".  R knee as well.  PT didn't help. Neuro: no dizziness, headache, weakness, vertigo Psych: no depression, anxiety, insomnia, SI  Red spot on L chest-x 1 wk. Has h/o skin ca.  Sees derm yearly. Not due  Objective:   Today's Vitals: BP 128/72   Pulse 68   Temp 97.8 F (36.6 C)   Ht 5' 3.5" (1.613 m)   Wt 178 lb (80.7 kg)   LMP 06/12/2000   SpO2 97%   BMI 31.04 kg/m   Physical Exam  Gen: WDWN NAD [facility] HEENT: NCAT, conjunctiva not injected, sclera nonicteric TM WNL B, OP not examined d/t covid/masked NECK:  supple, no thyromegaly, no nodes, no carotid bruits CARDIAC: RRR, S1S2+, 1/6 sys soft murmur. DP 2+B LUNGS: CTAB.  No wheezes ABDOMEN:  BS+, soft, NTND, No HSM, no masses EXT:  no edema MSK: no gross abnormalities.  NEURO: A&O x3.  CN II-XII intact.  PSYCH: normal mood. Good eye contact  Skin-L chest above breast-approx 64m pink papule w/indented center.  Assessment & Plan:   Problem List Items Addressed This Visit       Cardiovascular and Mediastinum   Essential hypertension - Primary   Relevant Orders   CT CARDIAC SCORING   Comprehensive metabolic panel   Hemoglobin A1c   TSH   Lipid panel   CBC with Differential/Platelet     Other   Hyperlipidemia   Relevant Orders   CT CARDIAC SCORING   TSH   Lipid panel   Other Visit Diagnoses     Prediabetes       Relevant Orders   Hemoglobin A1c   Family history of early CAD       Relevant Orders   CT CARDIAC SCORING   Skin lesion          HTN-chronic. well controlled.  Cont hctz 12.'5mg'$  and losartan '50mg'$ .  Will call for refills.  Check cbc,cmp. In 2 wks.  Monitor at least monthly HLD-chronic.  Well controlled.  Has made a lot of dietary changes.  After discussion, will continue to hold zocor '20mg'$  for 2 wks and the do lipids,cmp and decide if should continue or not.  preDM-a lot of TLC.  On ozempic.  Going to wt loss clinic.  Check A1C FH CAD-pt asymptomatic.  Trying to decide on risks/whether to continue statin.  Discussed calcium score and is cash pay-pt would like to proceed.  Skin lesion-h/o skin ca.  Has been present 1 wk only, but some concerning features.  Monitor for 1 wk, if not resolve, see Derm F/u 5 mo annual/htn  Outpatient Encounter Medications as of 11/10/2021  Medication Sig   aspirin EC 81 MG tablet Take 81 mg by mouth daily.   Calcium Carbonate-Vit D-Min (CALCIUM 1200 PO) Take by mouth daily.   Cholecalciferol (VITAMIN D) 50 MCG (2000 UT) CAPS Take by mouth.   hydrochlorothiazide (HYDRODIURIL) 12.5 MG tablet Take 12.5 mg by mouth daily.   Insulin Pen Needle (BD PEN NEEDLE NANO U/F) 32G X 4 MM MISC Use with Saxenda.    losartan (COZAAR) 100 MG tablet Take 1 tablet by mouth daily.   Probiotic Product (PROBIOTIC-10 PO) Take by mouth.   Semaglutide,0.25 or 0.'5MG'$ /DOS, (OZEMPIC, 0.25 OR 0.5  MG/DOSE,) 2 MG/1.5ML SOPN Inject 0.5 mg into the skin weekly   simvastatin (ZOCOR) 20 MG tablet Take 1 tablet by mouth daily.   [DISCONTINUED] nirmatrelvir & ritonavir (PAXLOVID, 300/100,) 20 x 150 MG & 10 x '100MG'$  TBPK Take 2 tablets nirmtrelvir and 1 tablet ritonavir twice daily. (Patient not taking: Reported on 11/10/2021)   No facility-administered encounter medications on file as of 11/10/2021.    Follow-up: Return for Nov-htn, annual     2 wks fasting labs.   Wellington Hampshire, MD

## 2021-11-21 ENCOUNTER — Encounter: Payer: Self-pay | Admitting: Family Medicine

## 2021-11-24 ENCOUNTER — Other Ambulatory Visit (INDEPENDENT_AMBULATORY_CARE_PROVIDER_SITE_OTHER): Payer: Medicare HMO

## 2021-11-24 DIAGNOSIS — I10 Essential (primary) hypertension: Secondary | ICD-10-CM

## 2021-11-24 DIAGNOSIS — R7303 Prediabetes: Secondary | ICD-10-CM | POA: Diagnosis not present

## 2021-11-24 DIAGNOSIS — E7800 Pure hypercholesterolemia, unspecified: Secondary | ICD-10-CM | POA: Diagnosis not present

## 2021-11-24 LAB — LIPID PANEL
Cholesterol: 219 mg/dL — ABNORMAL HIGH (ref 0–200)
HDL: 66 mg/dL (ref >39.00)
LDL Cholesterol: 137 mg/dL — ABNORMAL HIGH (ref 0–99)
NonHDL: 153.14
Total CHOL/HDL Ratio: 3
Triglycerides: 82 mg/dL (ref 0.0–149.0)
VLDL: 16.4 mg/dL (ref 0.0–40.0)

## 2021-11-24 LAB — CBC WITH DIFFERENTIAL/PLATELET
Basophils Absolute: 0 10*3/uL (ref 0.0–0.1)
Basophils Relative: 0.8 % (ref 0.0–3.0)
Eosinophils Absolute: 0 10*3/uL (ref 0.0–0.7)
Eosinophils Relative: 0.6 % (ref 0.0–5.0)
HCT: 37.7 % (ref 36.0–46.0)
Hemoglobin: 12.6 g/dL (ref 12.0–15.0)
Lymphocytes Relative: 31 % (ref 12.0–46.0)
Lymphs Abs: 1.9 10*3/uL (ref 0.7–4.0)
MCHC: 33.4 g/dL (ref 30.0–36.0)
MCV: 87.2 fl (ref 78.0–100.0)
Monocytes Absolute: 0.4 10*3/uL (ref 0.1–1.0)
Monocytes Relative: 7.2 % (ref 3.0–12.0)
Neutro Abs: 3.6 10*3/uL (ref 1.4–7.7)
Neutrophils Relative %: 60.4 % (ref 43.0–77.0)
Platelets: 218 10*3/uL (ref 150.0–400.0)
RBC: 4.32 Mil/uL (ref 3.87–5.11)
RDW: 13.3 % (ref 11.5–15.5)
WBC: 6 10*3/uL (ref 4.0–10.5)

## 2021-11-24 LAB — COMPREHENSIVE METABOLIC PANEL
ALT: 16 U/L (ref 0–35)
AST: 16 U/L (ref 0–37)
Albumin: 4.3 g/dL (ref 3.5–5.2)
Alkaline Phosphatase: 44 U/L (ref 39–117)
BUN: 17 mg/dL (ref 6–23)
CO2: 27 mEq/L (ref 19–32)
Calcium: 9.6 mg/dL (ref 8.4–10.5)
Chloride: 103 mEq/L (ref 96–112)
Creatinine, Ser: 0.83 mg/dL (ref 0.40–1.20)
GFR: 71.52 mL/min (ref >60.00)
Glucose, Bld: 91 mg/dL (ref 70–99)
Potassium: 3.8 mEq/L (ref 3.5–5.1)
Sodium: 139 mEq/L (ref 135–145)
Total Bilirubin: 0.5 mg/dL (ref 0.2–1.2)
Total Protein: 6.9 g/dL (ref 6.0–8.3)

## 2021-11-24 LAB — TSH: TSH: 2.1 u[IU]/mL (ref 0.35–5.50)

## 2021-11-24 LAB — HEMOGLOBIN A1C: Hgb A1c MFr Bld: 5.3 % (ref 4.6–6.5)

## 2021-11-30 ENCOUNTER — Ambulatory Visit
Admission: RE | Admit: 2021-11-30 | Discharge: 2021-11-30 | Disposition: A | Discharge status: Home / Self Care | Payer: Medicare HMO | Source: Ambulatory Visit | Attending: Family Medicine | Admitting: Family Medicine

## 2021-11-30 DIAGNOSIS — E7800 Pure hypercholesterolemia, unspecified: Secondary | ICD-10-CM

## 2021-11-30 DIAGNOSIS — Z8249 Family history of ischemic heart disease and other diseases of the circulatory system: Secondary | ICD-10-CM

## 2021-11-30 DIAGNOSIS — I10 Essential (primary) hypertension: Secondary | ICD-10-CM

## 2021-11-30 DIAGNOSIS — I700 Atherosclerosis of aorta: Secondary | ICD-10-CM | POA: Diagnosis not present

## 2021-12-05 ENCOUNTER — Other Ambulatory Visit: Payer: Self-pay | Admitting: *Deleted

## 2021-12-05 DIAGNOSIS — R931 Abnormal findings on diagnostic imaging of heart and coronary circulation: Secondary | ICD-10-CM

## 2021-12-05 MED ORDER — ROSUVASTATIN CALCIUM 20 MG PO TABS: 20.0000 mg | ORAL_TABLET | Freq: Every day | ORAL | 3 refills | Status: DC

## 2021-12-18 ENCOUNTER — Other Ambulatory Visit: Payer: Self-pay | Admitting: Family Medicine

## 2021-12-19 MED ORDER — HYDROCHLOROTHIAZIDE 12.5 MG PO TABS
12.5000 mg | ORAL_TABLET | Freq: Every day | ORAL | 3 refills | Status: DC
  Filled 2021-12-19 – 2022-03-24 (×3): qty 90, 90d supply, fill #0

## 2021-12-19 MED ORDER — LOSARTAN POTASSIUM 100 MG PO TABS
100.0000 mg | ORAL_TABLET | Freq: Every day | ORAL | 3 refills | Status: DC
Start: 2021-12-19 — End: 2022-06-16
  Filled 2021-12-19 – 2022-03-24 (×3): qty 90, 90d supply, fill #0

## 2021-12-20 ENCOUNTER — Other Ambulatory Visit: Payer: Self-pay

## 2021-12-22 DIAGNOSIS — D3617 Benign neoplasm of peripheral nerves and autonomic nervous system of trunk, unspecified: Secondary | ICD-10-CM | POA: Diagnosis not present

## 2021-12-22 DIAGNOSIS — L821 Other seborrheic keratosis: Secondary | ICD-10-CM | POA: Diagnosis not present

## 2021-12-22 DIAGNOSIS — L720 Epidermal cyst: Secondary | ICD-10-CM | POA: Diagnosis not present

## 2021-12-22 DIAGNOSIS — Z85828 Personal history of other malignant neoplasm of skin: Secondary | ICD-10-CM | POA: Diagnosis not present

## 2021-12-22 DIAGNOSIS — D692 Other nonthrombocytopenic purpura: Secondary | ICD-10-CM | POA: Diagnosis not present

## 2021-12-27 ENCOUNTER — Ambulatory Visit (INDEPENDENT_AMBULATORY_CARE_PROVIDER_SITE_OTHER): Payer: Medicare HMO | Admitting: Family Medicine

## 2021-12-27 ENCOUNTER — Encounter (INDEPENDENT_AMBULATORY_CARE_PROVIDER_SITE_OTHER): Payer: Self-pay | Admitting: Family Medicine

## 2021-12-27 VITALS — BP 115/72 | HR 65 | Temp 98.0°F | Ht 64.0 in | Wt 181.0 lb

## 2021-12-27 DIAGNOSIS — E669 Obesity, unspecified: Secondary | ICD-10-CM

## 2021-12-27 DIAGNOSIS — E1169 Type 2 diabetes mellitus with other specified complication: Secondary | ICD-10-CM | POA: Diagnosis not present

## 2021-12-27 DIAGNOSIS — Z6838 Body mass index (BMI) 38.0-38.9, adult: Secondary | ICD-10-CM

## 2021-12-27 DIAGNOSIS — Z6831 Body mass index (BMI) 31.0-31.9, adult: Secondary | ICD-10-CM

## 2021-12-27 DIAGNOSIS — E559 Vitamin D deficiency, unspecified: Secondary | ICD-10-CM | POA: Diagnosis not present

## 2021-12-28 LAB — CMP14+EGFR
ALT: 33 IU/L — ABNORMAL HIGH (ref 0–32)
AST: 26 IU/L (ref 0–40)
Albumin/Globulin Ratio: 2.2 (ref 1.2–2.2)
Albumin: 4.4 g/dL (ref 3.9–4.9)
Alkaline Phosphatase: 64 IU/L (ref 44–121)
BUN/Creatinine Ratio: 18 (ref 12–28)
BUN: 13 mg/dL (ref 8–27)
Bilirubin Total: 0.3 mg/dL (ref 0.0–1.2)
CO2: 25 mmol/L (ref 20–29)
Calcium: 9.4 mg/dL (ref 8.7–10.3)
Chloride: 105 mmol/L (ref 96–106)
Creatinine, Ser: 0.74 mg/dL (ref 0.57–1.00)
Globulin, Total: 2 g/dL (ref 1.5–4.5)
Glucose: 96 mg/dL (ref 70–99)
Potassium: 4.7 mmol/L (ref 3.5–5.2)
Sodium: 143 mmol/L (ref 134–144)
Total Protein: 6.4 g/dL (ref 6.0–8.5)
eGFR: 87 mL/min/{1.73_m2} (ref >59)

## 2021-12-28 LAB — INSULIN, RANDOM: INSULIN: 12.6 u[IU]/mL (ref 2.6–24.9)

## 2021-12-28 LAB — VITAMIN D 25 HYDROXY (VIT D DEFICIENCY, FRACTURES): Vit D, 25-Hydroxy: 55.7 ng/mL (ref 30.0–100.0)

## 2021-12-28 NOTE — Progress Notes (Signed)
Chief Complaint:   OBESITY Tina Sutton is here to discuss her progress with her obesity treatment plan along with follow-up of her obesity related diagnoses. [name] is on the Category 2 Plan and states she is following her eating plan approximately 85% of the time. [name] states she is doing 0 minutes 0 times per week.  Today's visit was #: 23 Starting weight: 226 lbs Starting date: 12/06/2019 Today's weight: 181 lbs Today's date: 12/27/2021 Total lbs lost to date: 45 Total lbs lost since last in-office visit: 0  Interim History: Tina Sutton has done well with maintaining her weight.  Her hunger is mostly controlled and she feels she no longer needs her GLP-1.  Subjective:   1. Vitamin D deficiency Tina Sutton is on vitamin D and her last labs were at goal.  2. Type 2 diabetes mellitus with other specified complication, without long-term current use of insulin ([place]) Tina Sutton has a history of diabetes mellitus with previous A1c of 6.5 on 10/31/2019.  She is stable on her diet, exercise, and Ozempic with a well-controlled A1c of 5.3.  She would like to try to discontinue Ozempic and continue with diet alone.  Assessment/Plan:   1. Vitamin D deficiency We will check labs today, and Charlott will continue OTC vitamin D 2000 units daily.  - VITAMIN D 25 Hydroxy (Vit-D Deficiency, Fractures)  2. Type 2 diabetes mellitus with other specified complication, without long-term current use of insulin ([facility]) We will check labs today. Tina Sutton agreed to discontinue Ozempic, and she will continue with her diet and exercise.   - CMP14+EGFR - Insulin, random  3. Obesity, Current BMI 31.1 Tina Sutton is currently in the action stage of change. As such, her goal is to maintain weight for now. She has agreed to the Category 2 Plan.   Behavioral modification strategies: increasing lean protein intake.  Tina Sutton has agreed to follow-up with our clinic in 3 months. She was informed of the importance of frequent  follow-up visits to maximize her success with intensive lifestyle modifications for her multiple health conditions.   Tina Sutton was informed we would discuss her lab results at her next visit unless there is a critical issue that needs to be addressed sooner. Tina Sutton agreed to keep her next visit at the agreed upon time to discuss these results.  Objective:   Blood pressure 115/72, pulse 65, temperature 98 F (36.7 C), height $RemoveBe'5\' 4"'kLircEzVR$  (1.626 m), weight 181 lb (82.1 kg), last menstrual period 06/12/2000, SpO2 97 %. Body mass index is 31.07 kg/m.  General: Cooperative, alert, well developed, in no acute distress. HEENT: Conjunctivae and lids unremarkable. Cardiovascular: Regular rhythm.  Lungs: Normal work of breathing. Neurologic: No focal deficits.   Lab Results  Component Value Date   CREATININE 0.74 12/27/2021   BUN 13 12/27/2021   NA 143 12/27/2021   K 4.7 12/27/2021   CL 105 12/27/2021   CO2 25 12/27/2021   Lab Results  Component Value Date   ALT 33 (H) 12/27/2021   AST 26 12/27/2021   ALKPHOS 64 12/27/2021   BILITOT 0.3 12/27/2021   Lab Results  Component Value Date   HGBA1C 5.3 11/24/2021   HGBA1C 5.1 07/04/2021   HGBA1C 5.6 03/07/2021   HGBA1C 5.4 10/18/2020   HGBA1C 5.6 06/22/2020   Lab Results  Component Value Date   INSULIN WILL FOLLOW 12/27/2021   INSULIN 8.8 07/04/2021   INSULIN 10.1 03/07/2021   INSULIN 13.6 10/18/2020   INSULIN 17.6 06/22/2020   Lab Results  Component Value Date   TSH 2.10 11/24/2021   Lab Results  Component Value Date   CHOL 219 (H) 11/24/2021   HDL 66.00 11/24/2021   LDLCALC 137 (H) 11/24/2021   TRIG 82.0 11/24/2021   CHOLHDL 3 11/24/2021   Lab Results  Component Value Date   VD25OH 55.7 12/27/2021   VD25OH 65.4 07/04/2021   VD25OH 60.9 03/07/2021   Lab Results  Component Value Date   WBC 6.0 11/24/2021   HGB 12.6 11/24/2021   HCT 37.7 11/24/2021   MCV 87.2 11/24/2021   PLT 218.0 11/24/2021   No results found for:  "IRON", "TIBC", "FERRITIN"  Obesity Behavioral Intervention:   Approximately 15 minutes were spent on the discussion below.  ASK: We discussed the diagnosis of obesity with Tina Sutton today and Tina Sutton agreed to give [name] permission to discuss obesity behavioral modification therapy today.  ASSESS: Tina Sutton has the diagnosis of obesity and her BMI today is 31.1. Tina Sutton is in the action stage of change.   ADVISE: Prescilla was educated on the multiple health risks of obesity as well as the benefit of weight loss to improve her health. She was advised of the need for long term treatment and the importance of lifestyle modifications to improve her current health and to decrease her risk of future health problems.  AGREE: Multiple dietary modification options and treatment options were discussed and Tina Sutton agreed to follow the recommendations documented in the above note.  ARRANGE: Tina Sutton was educated on the importance of frequent visits to treat obesity as outlined per CMS and USPSTF guidelines and agreed to schedule her next follow up appointment today.  Attestation Statements:   Reviewed by clinician on day of visit: allergies, medications, problem list, medical history, surgical history, family history, social history, and previous encounter notes.   I, Trixie Dredge, am acting as transcriptionist for [name], MD.  I have reviewed the above documentation for accuracy and completeness, and I agree with the above. -  [name], MD

## 2022-01-03 ENCOUNTER — Encounter (HOSPITAL_BASED_OUTPATIENT_CLINIC_OR_DEPARTMENT_OTHER): Payer: Self-pay | Admitting: Cardiology

## 2022-01-03 ENCOUNTER — Ambulatory Visit (HOSPITAL_BASED_OUTPATIENT_CLINIC_OR_DEPARTMENT_OTHER): Payer: Medicare HMO | Admitting: Cardiology

## 2022-01-03 VITALS — BP 140/78 | HR 69 | Ht 63.5 in | Wt 185.9 lb

## 2022-01-03 DIAGNOSIS — Z713 Dietary counseling and surveillance: Secondary | ICD-10-CM

## 2022-01-03 DIAGNOSIS — Z7189 Other specified counseling: Secondary | ICD-10-CM | POA: Diagnosis not present

## 2022-01-03 DIAGNOSIS — Z8249 Family history of ischemic heart disease and other diseases of the circulatory system: Secondary | ICD-10-CM

## 2022-01-03 DIAGNOSIS — Z7182 Exercise counseling: Secondary | ICD-10-CM | POA: Diagnosis not present

## 2022-01-03 DIAGNOSIS — I2510 Atherosclerotic heart disease of native coronary artery without angina pectoris: Secondary | ICD-10-CM

## 2022-01-03 NOTE — Patient Instructions (Signed)
Medication Instructions:  Your Physician recommend you continue on your current medication as directed.    *If you need a refill on your cardiac medications before your next appointment, please call your pharmacy*   Lab Work: None ordered today   Testing/Procedures: Your physician has requested that you have an exercise tolerance test. For further information please visit [email or web address]. Please also follow instruction sheet, as given.  [place]. [address]  Follow-Up: At [facility], you and your health needs are our priority.  As part of our continuing mission to provide you with exceptional heart care, we have created designated Provider Care Teams.  These Care Teams include your primary Cardiologist (physician) and Advanced Practice Providers (APPs -  Physician Assistants and Nurse Practitioners) who all work together to provide you with the care you need, when you need it.  We recommend signing up for the patient portal called "MyChart".  Sign up information is provided on this After Visit Summary.  MyChart is used to connect with patients for Virtual Visits (Telemedicine).  Patients are able to view lab/test results, encounter notes, upcoming appointments, etc.  Non-urgent messages can be sent to your provider as well.   To learn more about what you can do with MyChart, go to [email or web address].    Your next appointment:   2 year(s)  The format for your next appointment:   In Person  Provider:   Buford Dresser, MD{    [place] [facility] [address] [place], [place] [address] Phone:  [phone]        You are scheduled for an Exercise Stress Test   Please arrive 15 minutes prior to your appointment time for registration and insurance purposes.  The test will take approximately 45 minutes to complete.  How to prepare for your Exercise Stress Test: Do bring a list of your current  medications with you.  If not listed below, you may take your medications as normal. Do wear comfortable clothes (no dresses or overalls) and walking shoes, tennis shoes preferred (no heels or open toed shoes are allowed) Do Not wear cologne, perfume, aftershave or lotions (deodorant is allowed). Please report to [place], [address] for your test.  If these instructions are not followed, your test will have to be rescheduled.  If you have questions or concerns about your appointment, you can call the Stress Lab at [phone].  If you cannot keep your appointment, please provide 24 hours notification to the Stress Lab, to avoid a possible $50 charge to your account

## 2022-01-03 NOTE — Progress Notes (Signed)
Cardiology Office Note:    Date:  01/03/2022   ID:  Tina Sutton, DOB [birth year], MRN [id number]  PCP:  [name] Crook, MD  Cardiologist:  Buford Dresser, MD  Referring MD: [name] Crook, MD   CC:  New patient evaluation for elevated CA score.   History of Present Illness:    Tina Sutton is a 71 y.o. female with a hx of hypertension, hyperlipidemia, hyperlipemia, skin cancer, obesity, and sleep apnea, who is seen as a new consult at the request of [name] Crook, MD for the evaluation and management of elevated coronary artery calcium score.  She was seen by her PCP Dr. Cherlynn Kaiser on 11/10/2021. She reported that her mother had known CAD at 16 yo. She was also considering risks/whether she needs to continue her statin. They discussed calcium score; she agreed to proceed with testing. Results personally reviewed and shown below.  Of note, she presented to urgent care 11/04/21 after testing positive for COVID-19 that morning. Started on Paxlovid.  Cardiovascular risk factors: Prior clinical ASCVD: None. Comorbid conditions:  Hypertension - Diagnosed since her 20's, at the time she weighed 125 lbs. On HCTZ 12.5 mg and Losartan 50 mg. Hyperlipidemia - Diagnosed "a long time." On simvastatin. Of note, she states that during her recent cholesterol labs, she had been off of simvastatin for 3 weeks while she was on Paxlovid. Prediabetes - On Ozempic and participating in [facility]. Metabolic syndrome/Obesity: Highest adult weight was 242 lbs, currently she is 185 lbs in clinic. Chronic inflammatory conditions: None. Tobacco use history: Never. However, she reports being exposed to second hand smoke as a child. Family history: Her mother had a CABG x4 at age 29, which was indicated after a stress test. Also she needed a valve replacement. She states that she sees herself patterning after her mother, they also both underwent a hysterectomy at the same age. Her father  had a pacemaker implanted in his late 70's/early 46's; also had anginal symptoms. Both of her parents died at 17 yo. She has 4 siblings. One drowned before she was 22 yo. Two siblings had breast cancer. Her oldest sister is showing early signs of mental impairment, currently living at 70 yo. Exercise level: She "runs around in her car a lot." She states that she is reasonably mobile. Lately she pushed a large bookcase upstairs from her basement.  Current diet: Improved significantly since focusing on weight loss. Follows a protein diet: baked salmon, canned chicken or tuna, baked chicken. She notes that she could eat more vegetables; has blueberries and strawberries almost daily. Prior cardiac testing and/or incidental findings on other testing:  CT CORONARY ARTERY CALCIUM SCORE  11/30/2021: CORONARY CALCIUM SCORES: Left Main: 17.9 LAD: 779 LCx: 162 RCA: 137   Total Agatston Score: 1,096 MESA database percentile: 97   AORTA MEASUREMENTS: Ascending Aorta: 39 mm Descending Aorta: 25 mm  IMPRESSION: 1. Coronary artery calcium score of 1,096 which places the patient in the 97th percentile for age, gender and race/ethnicity. 2. Small hiatal hernia. 3. Aortic atherosclerosis.   Additionally she reports a prior stress test performed in 2007 that was normal.   Months ago, she experienced some jaw pain while sitting on the sofa. At the time she also had long-term pain in her right leg: an MRI revealed a frayed/torn tendon. Occasionally she still has residual pain in her leg.  Sometimes she will feel lightheaded. Usually she believes this is related to dehydration so she drinks some water.  She used to snore loudly. Lately she is not certain if she continues to snore. Years ago she had stopped using her CPAP due to intolerance. She never had noticed any true improvement with using her CPAP.  She denies any palpitations, chest pain, shortness of breath, or peripheral edema. No headaches,  syncope, orthopnea, or PND.  Past Medical History:  Diagnosis Date   Depression    Dysmenorrhea    Fibroid    Hyperlipemia    Hyperlipidemia    Hypertension    Inverted nipple x several yrs   bilateral, pt states nipples have always been inverted   Joint pain    Obesity    Sleep apnea    SOB (shortness of breath)    Vitamin D deficiency     Past Surgical History:  Procedure Laterality Date   BLEPHAROPLASTY Bilateral 02/21/2008   BREAST BIOPSY Right    BREAST EXCISIONAL BIOPSY Right    benign   PELVIC LAPAROSCOPY     TOTAL ABDOMINAL HYSTERECTOMY  05/04/2001   [place] bso.  fibroids    Current Medications: Current Outpatient Medications on File Prior to Visit  Medication Sig   aspirin EC 81 MG tablet Take 81 mg by mouth daily.   Calcium Carbonate-Vit D-Min (CALCIUM 1200 PO) Take by mouth daily.   Cholecalciferol (VITAMIN D) 50 MCG (2000 UT) CAPS Take by mouth.   hydrochlorothiazide (HYDRODIURIL) 12.5 MG tablet Take 1 tablet (12.5 mg total) by mouth daily.   Insulin Pen Needle (BD PEN NEEDLE NANO U/F) 32G X 4 MM MISC Use with Saxenda.   losartan (COZAAR) 100 MG tablet Take 1 tablet (100 mg total) by mouth daily.   Probiotic Product (PROBIOTIC-10 PO) Take by mouth.   No current facility-administered medications on file prior to visit.     Allergies:   Patient has no known allergies.   Social History   Tobacco Use   Smoking status: Never   Smokeless tobacco: Never  Vaping Use   Vaping Use: Never used  Substance Use Topics   Alcohol use: Yes    Alcohol/week: 1.0 - 5.0 standard drink of alcohol    Types: 1 - 5 Glasses of wine per week   Drug use: No    Family History: family history includes Alzheimer's disease in her mother; Breast cancer in her sister and sister; Depression in her mother; Diabetes in her father; Heart disease in her mother; Hyperlipidemia in her mother; Hypertension in her father, mother, sister, sister, and sister; Obesity in her father and  mother; Stroke in her father.  ROS:   Please see the history of present illness.  Additional pertinent ROS: Constitutional: Negative for chills, fever, night sweats, unintentional weight loss.  HENT: Negative for ear pain and hearing loss.   Eyes: Negative for loss of vision and eye pain.  Respiratory: Negative for cough, sputum, wheezing.   Cardiovascular: See HPI. Gastrointestinal: Negative for abdominal pain, melena, and hematochezia.  Genitourinary: Negative for dysuria and hematuria.  Musculoskeletal: Negative for falls and myalgias. Positive for rare RLE pain. Skin: Negative for itching and rash.  Neurological: Negative for focal weakness, focal sensory changes and loss of consciousness. Positive for occasional lightheadedness. Endo/Heme/Allergies: Does not bruise/bleed easily.     EKGs/Labs/Other Studies Reviewed:    The following studies were reviewed today:  CT CORONARY ARTERY CALCIUM SCORE  11/30/2021: FINDINGS: CORONARY CALCIUM SCORES:   Left Main: 17.9   LAD: 779   LCx: 162   RCA: 137   Total Agatston  Score: 1,096   MESA database percentile: 97   AORTA MEASUREMENTS:   Ascending Aorta: 39 mm   Descending Aorta: 25 mm   OTHER FINDINGS:   Cardiovascular: Coronary artery disease as outlined above. Scattered aortic atherosclerosis. Central pulmonary vasculature is normal caliber. No pericardial effusion or thickening. Limited assessment of cardiovascular structures given lack of intravenous contrast.   Mediastinum/Nodes: No acute process or adenopathy in visualized portions of the mediastinum.   Lungs/Pleura: Basilar atelectasis without effusion or sign of consolidative changes. Airways are patent.   Upper Abdomen: Incidental imaging of upper abdominal contents shows no acute finding. Small hiatal hernia.   Musculoskeletal: No chest wall lesion to the extent evaluated. No acute process or destructive bone finding.   IMPRESSION: 1. Coronary artery  calcium score of 1,096 which places the patient in the 97th percentile for age, gender and race/ethnicity. 2. Small hiatal hernia. 3. Aortic atherosclerosis.  Echocardiogram  10/25/2017: Study Conclusions   - Left ventricle: The cavity size was normal. Wall thickness was    increased in a pattern of mild LVH. Systolic function was    vigorous. The estimated ejection fraction was in the range of 65%    to 70%. Wall motion was normal; there were no regional wall    motion abnormalities. Doppler parameters are consistent with    abnormal left ventricular relaxation (grade 1 diastolic    dysfunction).  - Aortic valve: Mildly calcified annulus. There was very mild    stenosis. Valve area (VTI): 1.94 cm^2. Valve area (Vmax): 2.13    cm^2. Valve area (Vmean): 1.97 cm^2.  - Mitral valve: There was mild regurgitation.  - Pulmonary arteries: Systolic pressure was mildly increased. PA    peak pressure: 39 mm Hg (S).    EKG:  EKG is personally reviewed.   01/03/2022:  SR at 69 bpm with first degree AV block  Recent Labs: 11/24/2021: Hemoglobin 12.6; Platelets 218.0; TSH 2.10 12/27/2021: ALT 33; BUN 13; Creatinine, Ser 0.74; Potassium 4.7; Sodium 143   Recent Lipid Panel    Component Value Date/Time   CHOL 219 (H) 11/24/2021 0831   CHOL 199 07/04/2021 0805   TRIG 82.0 11/24/2021 0831   HDL 66.00 11/24/2021 0831   HDL 77 07/04/2021 0805   CHOLHDL 3 11/24/2021 0831   VLDL 16.4 11/24/2021 0831   LDLCALC 137 (H) 11/24/2021 0831   LDLCALC 109 (H) 07/04/2021 0805    Physical Exam:    VS:  BP 140/78 (BP Location: Right Arm, Patient Position: Sitting, Cuff Size: Normal)   Pulse 69   Ht 5' 3.5" (1.613 m)   Wt 185 lb 14.4 oz (84.3 kg)   LMP 06/12/2000   BMI 32.41 kg/m     Wt Readings from Last 3 Encounters:  12/27/21 181 lb (82.1 kg)  11/10/21 178 lb (80.7 kg)  09/26/21 181 lb (82.1 kg)    GEN: Well nourished, well developed in no acute distress HEENT: Normal, moist mucous  membranes NECK: No JVD CARDIAC: regular rhythm, normal S1 and S2, no rubs or gallops. 2/6 aortic murmur, 1/6 mitral murmur. VASCULAR: Radial and DP pulses 2+ bilaterally. No carotid bruits RESPIRATORY:  Clear to auscultation without rales, wheezing or rhonchi  ABDOMEN: Soft, non-tender, non-distended MUSCULOSKELETAL:  Ambulates independently SKIN: Warm and dry, no edema NEUROLOGIC:  Alert and oriented x 3. No focal neuro deficits noted. PSYCHIATRIC:  Normal affect    ASSESSMENT:    1. Coronary artery calcification seen on CT scan   2. Cardiac risk  counseling   3. Counseling on health promotion and disease prevention   4. Family history of heart disease   5. Coronary artery disease involving native coronary artery of native heart without angina pectoris   6. Exercise counseling   7. Nutritional counseling    PLAN:    Elevated coronary calcium score:  -will proceed with exercise treadmill stress test to rule out silent ischemia due to obstructive CAD. Presume nonbstructive CAD if stress test without ischemia-->updated to add, results of stress test reassuring -has family history of heart disease as well -currently on aspirin.  -she states she is currently taking simvastatin. Her lipids were when she was off statin for a time (LDL 137). Reviewed recommendations for LDL <70. She has been prescribed rosuvastatin 20 mg daily, she will take this and have her PCP follow up her lipid labs.  Prior obesity, prior type II diabetes -much improved with weight loss through [facility], has used Ozempic with good results  Cardiac risk counseling and prevention recommendations: -recommend heart healthy/Mediterranean diet, with whole grains, fruits, vegetable, fish, lean meats, nuts, and olive oil. Limit salt. -recommend moderate walking, 3-5 times/week for 30-50 minutes each session. Aim for at least 150 minutes.week. Goal should be pace of 3 miles/hours, or walking 1.5 miles in 30  minutes -recommend avoidance of tobacco products. Avoid excess alcohol. -ASCVD risk score: The 10-year ASCVD risk score (Arnett DK, et al., 2019) is: 18.8%   Values used to calculate the score:     Age: 8 years     Sex: Female     Is Non-Hispanic African American: No     Diabetic: Yes     Tobacco smoker: No     Systolic Blood Pressure: 193 mmHg     Is BP treated: Yes     HDL Cholesterol: 66 mg/dL     Total Cholesterol: 219 mg/dL    Plan for follow up: 2 years or sooner as needed.  Buford Dresser, MD, PhD, [facility]    Medication Adjustments/Labs and Tests Ordered: Current medicines are reviewed at length with the patient today.  Concerns regarding medicines are outlined above.   Orders Placed This Encounter  Procedures   Cardiac Stress Test: Informed Consent Details: Physician/Practitioner Attestation; Transcribe to consent form and obtain patient signature   Exercise Tolerance Test   EKG 12-Lead   No orders of the defined types were placed in this encounter.  Patient Instructions  Medication Instructions:  Your Physician recommend you continue on your current medication as directed.    *If you need a refill on your cardiac medications before your next appointment, please call your pharmacy*   Lab Work: None ordered today   Testing/Procedures: Your physician has requested that you have an exercise tolerance test. For further information please visit [email or web address]. Please also follow instruction sheet, as given.  [place]  Follow-Up: At [facility], you and your health needs are our priority.  As part of our continuing mission to provide you with exceptional heart care, we have created designated Provider Care Teams.  These Care Teams include your primary Cardiologist (physician) and Advanced Practice Providers (APPs -  Physician Assistants and Nurse Practitioners) who all work together to provide you with the  care you need, when you need it.  We recommend signing up for the patient portal called "MyChart".  Sign up information is provided on this After Visit Summary.  MyChart is used to connect with  patients for Virtual Visits (Telemedicine).  Patients are able to view lab/test results, encounter notes, upcoming appointments, etc.  Non-urgent messages can be sent to your provider as well.   To learn more about what you can do with MyChart, go to [email or web address].    Your next appointment:   2 year(s)  The format for your next appointment:   In Person  Provider:   Buford Dresser, MD{    [place] at [facility] [address] [place], [place] [address] Phone:  [phone]        You are scheduled for an Exercise Stress Test   Please arrive 15 minutes prior to your appointment time for registration and insurance purposes.  The test will take approximately 45 minutes to complete.  How to prepare for your Exercise Stress Test: Do bring a list of your current medications with you.  If not listed below, you may take your medications as normal. Do wear comfortable clothes (no dresses or overalls) and walking shoes, tennis shoes preferred (no heels or open toed shoes are allowed) Do Not wear cologne, perfume, aftershave or lotions (deodorant is allowed). Please report to [place], [address] for your test.  If these instructions are not followed, your test will have to be rescheduled.  If you have questions or concerns about your appointment, you can call the Stress Lab at [phone].  If you cannot keep your appointment, please provide 24 hours notification to the Stress Lab, to avoid a possible $50 charge to your account        I,Mathew Stumpf,acting as a scribe for Buford Dresser, MD.,have documented all relevant documentation on the behalf of Buford Dresser, MD,as directed by  Buford Dresser, MD while in the presence of Buford Dresser, MD.  I, Buford Dresser, MD, have reviewed all documentation for this visit. The documentation on 02/14/22 for the exam, diagnosis, procedures, and orders are all accurate and complete.   Signed, Buford Dresser, MD PhD 01/03/2022     [place]

## 2022-01-04 ENCOUNTER — Telehealth (HOSPITAL_COMMUNITY): Payer: Self-pay | Admitting: *Deleted

## 2022-01-04 NOTE — Telephone Encounter (Signed)
Close encounter 

## 2022-01-05 ENCOUNTER — Ambulatory Visit (HOSPITAL_COMMUNITY)
Admission: RE | Admit: 2022-01-05 | Discharge: 2022-01-05 | Disposition: A | Discharge status: Home / Self Care | Payer: Medicare HMO | Source: Ambulatory Visit | Attending: Cardiology | Admitting: Cardiology

## 2022-01-05 DIAGNOSIS — I2510 Atherosclerotic heart disease of native coronary artery without angina pectoris: Secondary | ICD-10-CM | POA: Insufficient documentation

## 2022-01-05 LAB — EXERCISE TOLERANCE TEST
Angina Index: 0
Duke Treadmill Score: 8
Estimated workload: 10.1
Exercise duration (min): 8 min
Exercise duration (sec): 0 s
MPHR: 150 {beats}/min
Peak HR: 160 {beats}/min
Percent HR: 106 %
Rest HR: 80 {beats}/min
ST Depression (mm): 0 mm

## 2022-01-18 ENCOUNTER — Encounter (INDEPENDENT_AMBULATORY_CARE_PROVIDER_SITE_OTHER): Payer: Self-pay

## 2022-01-31 IMAGING — MR MR HIP*R* W/CM
6 series · 40 of 40 positions shown · IV contrast (gadavist)
Comparison: Bilateral hip x-rays dated August 23, 2020.

CLINICAL DATA: Right hip pain for the past 4 months.

EXAM:
MRI OF THE RIGHT HIP WITH CONTRAST
TECHNIQUE: Multiplanar, multisequence MR imaging was performed following the
administration of intravenous contrast.
CONTRAST:  1mL GADAVIST GADOBUTROL 1 MMOL/ML IV SOLN

[Series 3: STIR · coronal · 4.0mm · 1.33mm/px · 9 of 39 slices shown]
[im 1/39]
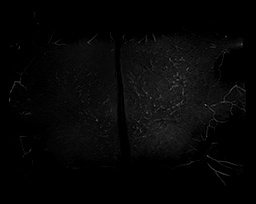
[im 5/39]
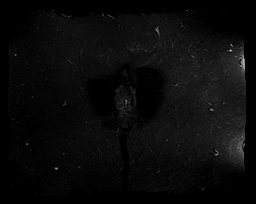
[im 10/39]
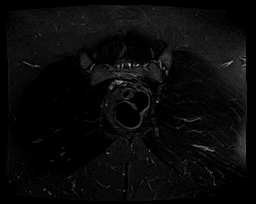
[im 15/39]
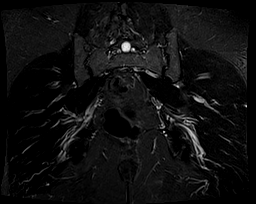
[im 20/39]
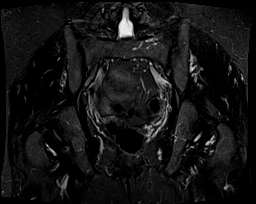
[im 24/39]
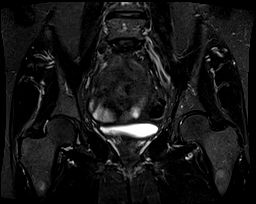
[im 29/39]
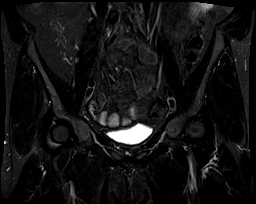
[im 34/39]
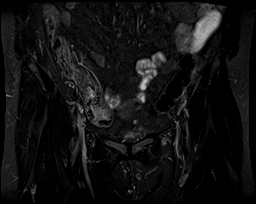
[im 39/39]
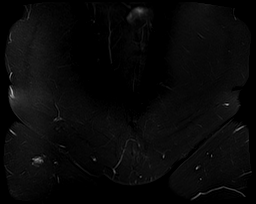

[Series 4: T1 · coronal · 4.0mm · 1.33mm/px · 9 of 39 slices shown]
[im 1/39]
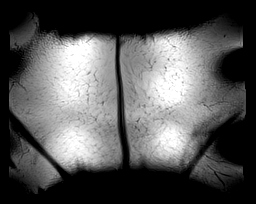
[im 5/39]
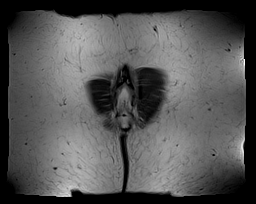
[im 10/39]
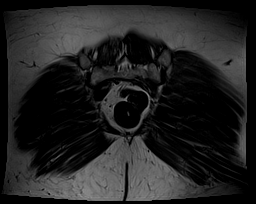
[im 15/39]
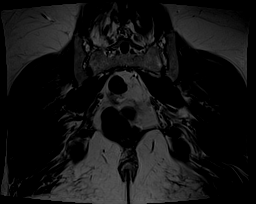
[im 20/39]
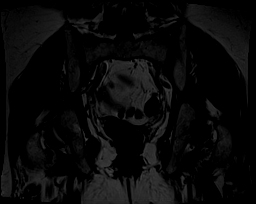
[im 24/39]
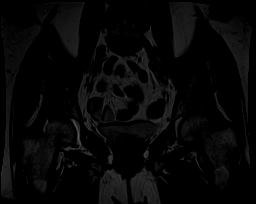
[im 29/39]
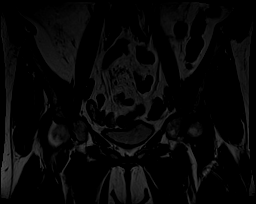
[im 34/39]
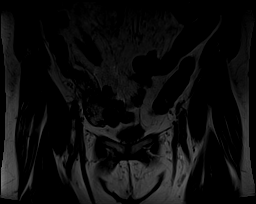
[im 39/39]
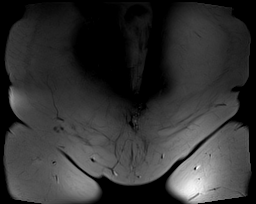

[Series 5: T1 fat-sat · axial · 4.0mm · 1.48mm/px · z∈[-145,-25]mm · 6 of 25 slices shown (1 of 4)]
[im 1/25]
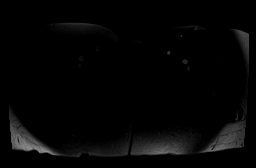
[im 5/25]
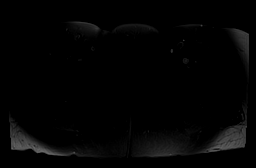
[im 10/25]
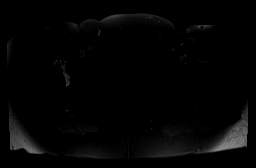
[im 15/25]
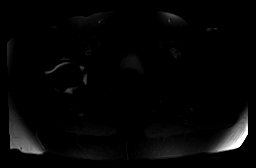
[im 20/25]
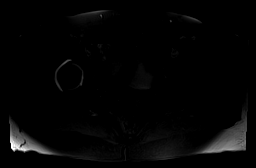
[im 25/25]
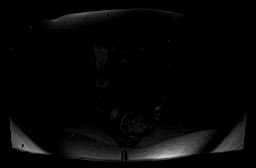

[Series 6: T1 fat-sat · oblique · 4.0mm · 0.70mm/px · 5 of 21 slices shown (2 of 4)]
[im 1/21]
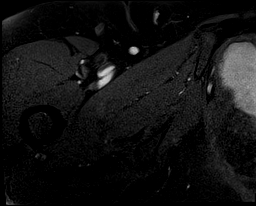
[im 6/21]
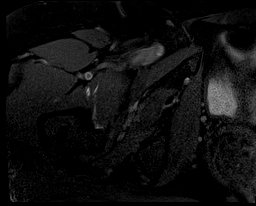
[im 11/21]
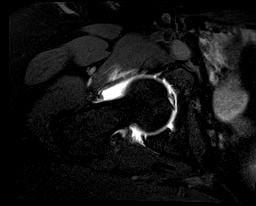
[im 16/21]
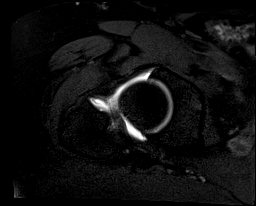
[im 21/21]
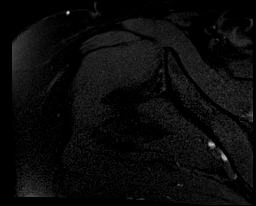

[Series 7: T1 fat-sat · coronal · 4.0mm · 0.70mm/px · 5 of 23 slices shown (3 of 4)]
[im 1/23]
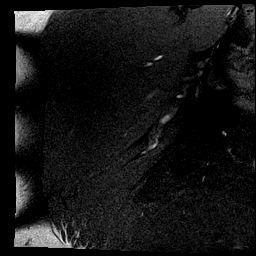
[im 6/23]
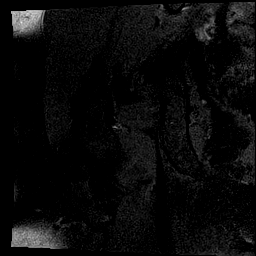
[im 12/23]
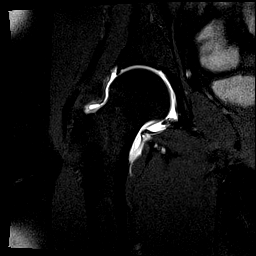
[im 17/23]
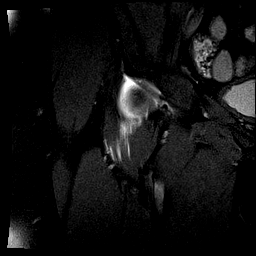
[im 23/23]
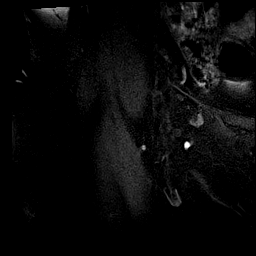

[Series 8: T1 fat-sat · sagittal · 4.0mm · 0.70mm/px · 6 of 24 slices shown (4 of 4)]
[im 1/24]
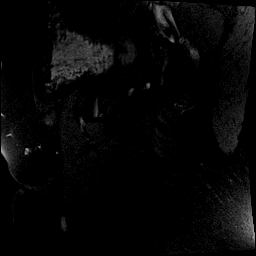
[im 5/24]
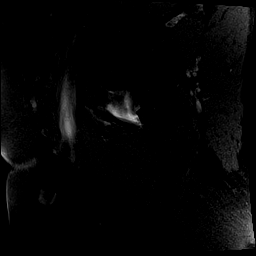
[im 10/24]
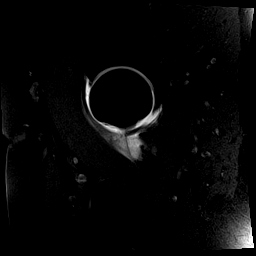
[im 14/24]
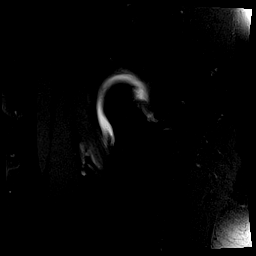
[im 19/24]
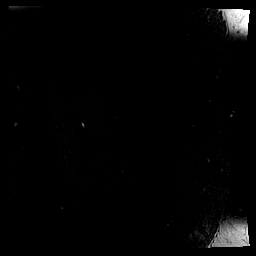
[im 24/24]
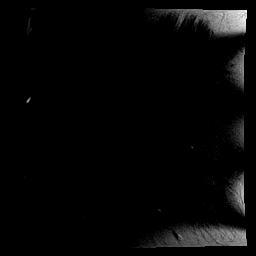

[40 of 40 positions shown; findings below may reference images not displayed]

FINDINGS: Bones: There is no evidence of acute fracture, dislocation or
avascular necrosis. No focal bone lesion. The visualized sacroiliac
joints and symphysis pubis appear normal.

Articular cartilage and labrum

Articular cartilage: No focal chondral defect or subchondral signal
abnormality identified.

Labrum: Fraying of the right anterior superior labrum with small
tear (series 6, image 7; series 8, image 15). Remaining labrum is
intact.

Joint or bursal effusion

Joint effusion: The right hip joint is distended with
intra-articular contrast. No left hip joint effusion.

Bursae: No focal periarticular fluid collection.

Muscles and tendons

Muscles and tendons: The visualized gluteus, hamstring and iliopsoas
tendons appear normal. No muscle edema or atrophy.

Other findings

Miscellaneous: Prior hysterectomy. Left-sided colonic
diverticulosis.
IMPRESSION: 1. Fraying of the right anterior superior labrum with small tear.

## 2022-03-06 ENCOUNTER — Encounter: Payer: Self-pay | Admitting: *Deleted

## 2022-03-10 ENCOUNTER — Telehealth: Payer: Self-pay | Admitting: Family Medicine

## 2022-03-10 NOTE — Telephone Encounter (Signed)
Copied from [place] [phone]. Topic: Medicare AWV >> Mar 10, 2022  9:18 AM Devoria Glassing wrote: Reason for CRM: Left message for patient to schedule Annual Wellness Visit.  Please schedule with Nurse Health Advisor Charlott Rakes, RN at [facility]. This appt can be telephone or office visit. Please call [phone] ask for [facility]

## 2022-03-24 ENCOUNTER — Other Ambulatory Visit: Payer: Self-pay

## 2022-04-04 ENCOUNTER — Ambulatory Visit (INDEPENDENT_AMBULATORY_CARE_PROVIDER_SITE_OTHER): Payer: Medicare HMO | Admitting: Family Medicine

## 2022-04-04 ENCOUNTER — Encounter (INDEPENDENT_AMBULATORY_CARE_PROVIDER_SITE_OTHER): Payer: Self-pay | Admitting: Family Medicine

## 2022-04-04 VITALS — BP 130/72 | HR 70 | Temp 98.2°F | Ht 64.0 in | Wt 186.0 lb

## 2022-04-04 DIAGNOSIS — E669 Obesity, unspecified: Secondary | ICD-10-CM

## 2022-04-04 DIAGNOSIS — Z7985 Long-term (current) use of injectable non-insulin antidiabetic drugs: Secondary | ICD-10-CM | POA: Diagnosis not present

## 2022-04-04 DIAGNOSIS — E1169 Type 2 diabetes mellitus with other specified complication: Secondary | ICD-10-CM

## 2022-04-04 DIAGNOSIS — Z6832 Body mass index (BMI) 32.0-32.9, adult: Secondary | ICD-10-CM

## 2022-04-04 DIAGNOSIS — E559 Vitamin D deficiency, unspecified: Secondary | ICD-10-CM | POA: Diagnosis not present

## 2022-04-04 DIAGNOSIS — E119 Type 2 diabetes mellitus without complications: Secondary | ICD-10-CM | POA: Insufficient documentation

## 2022-04-10 NOTE — Progress Notes (Signed)
Chief Complaint:   OBESITY Tina Sutton is here to discuss her progress with her obesity treatment plan along with follow-up of her obesity related diagnoses. Tina Sutton is on the Category 2 Plan and states she is following her eating plan approximately 90% of the time. Tina Sutton states she is doing water walking for 45 minutes 2 times per week.  Today's visit was #: 24 Starting weight: 226 lbs Starting date: 12/06/2019 Today's weight: 186 lbs Today's date: 04/04/2022 Total lbs lost to date: 40 Total lbs lost since last in-office visit: 0  Interim History: Tina Sutton has tried to maintain her weight, but she states that she has gained approximately 10 lbs but she has already gotten back on track. She has lost some of this weight. She has added exercise and her muscle mas has increased.   Subjective:   1. Type 2 diabetes mellitus with other specified complication, without long-term current use of insulin (HCC) Tina Sutton stopped Ozempic approximately 1 month ago. She is working diet and exercise. Her last A1c was 5.4.  2. Vitamin D deficiency Tina Sutton Vitamin D level is at goal on OTC Vitamin D.   Assessment/Plan:   1. Type 2 diabetes mellitus with other specified complication, without long-term current use of insulin (HCC) Tina Sutton will continue with her diet and exercise, and we will recheck labs in 3 months.   2. Vitamin D deficiency Tina Sutton will continue Vitamin D OTC 2,000 IU daily, and we will recheck labs in 3 months.   3. Obesity, Current BMI 32.0 Tina Sutton is currently in the action stage of change. As such, her goal is to continue with weight loss efforts. She has agreed to practicing portion control and making smarter food choices, such as increasing vegetables and decreasing simple carbohydrates.   Exercise goals: As is.   Behavioral modification strategies: increasing lean protein intake.  Tina Sutton has agreed to follow-up with our clinic in 3 months. She was informed of the  importance of frequent follow-up visits to maximize her success with intensive lifestyle modifications for her multiple health conditions.   Objective:   Blood pressure 130/72, pulse 70, temperature 98.2 F (36.8 C), height 5\' 4"  (1.626 m), weight 186 lb (84.4 kg), last menstrual period 06/12/2000, SpO2 99 %. Body mass index is 31.93 kg/m.  General: Cooperative, alert, well developed, in no acute distress. HEENT: Conjunctivae and lids unremarkable. Cardiovascular: Regular rhythm.  Lungs: Normal work of breathing. Neurologic: No focal deficits.   Lab Results  Component Value Date   CREATININE 0.74 12/27/2021   BUN 13 12/27/2021   NA 143 12/27/2021   K 4.7 12/27/2021   CL 105 12/27/2021   CO2 25 12/27/2021   Lab Results  Component Value Date   ALT 33 (H) 12/27/2021   AST 26 12/27/2021   ALKPHOS 64 12/27/2021   BILITOT 0.3 12/27/2021   Lab Results  Component Value Date   HGBA1C 5.3 11/24/2021   HGBA1C 5.1 07/04/2021   HGBA1C 5.6 03/07/2021   HGBA1C 5.4 10/18/2020   HGBA1C 5.6 06/22/2020   Lab Results  Component Value Date   INSULIN 12.6 12/27/2021   INSULIN 8.8 07/04/2021   INSULIN 10.1 03/07/2021   INSULIN 13.6 10/18/2020   INSULIN 17.6 06/22/2020   Lab Results  Component Value Date   TSH 2.10 11/24/2021   Lab Results  Component Value Date   CHOL 219 (H) 11/24/2021   HDL 66.00 11/24/2021   LDLCALC 137 (H) 11/24/2021   TRIG 82.0 11/24/2021   CHOLHDL 3  11/24/2021   Lab Results  Component Value Date   VD25OH 55.7 12/27/2021   VD25OH 65.4 07/04/2021   VD25OH 60.9 03/07/2021   Lab Results  Component Value Date   WBC 6.0 11/24/2021   HGB 12.6 11/24/2021   HCT 37.7 11/24/2021   MCV 87.2 11/24/2021   PLT 218.0 11/24/2021   No results found for: "IRON", "TIBC", "FERRITIN"  Attestation Statements:   Reviewed by clinician on day of visit: allergies, medications, problem list, medical history, surgical history, family history, social history, and  previous encounter notes.  Time spent on visit including pre-visit chart review and post-visit care and charting was 36 minutes.   I, Burt Knack, am acting as transcriptionist for Quillian Quince, MD.  I have reviewed the above documentation for accuracy and completeness, and I agree with the above. -  Quillian Quince, MD

## 2022-04-12 ENCOUNTER — Encounter: Payer: Self-pay | Admitting: Family Medicine

## 2022-04-12 ENCOUNTER — Ambulatory Visit (INDEPENDENT_AMBULATORY_CARE_PROVIDER_SITE_OTHER): Payer: Medicare HMO | Admitting: Family Medicine

## 2022-04-12 ENCOUNTER — Encounter (HOSPITAL_BASED_OUTPATIENT_CLINIC_OR_DEPARTMENT_OTHER): Payer: Self-pay

## 2022-04-12 VITALS — BP 120/72 | HR 64 | Temp 98.1°F | Ht 64.0 in | Wt 190.2 lb

## 2022-04-12 DIAGNOSIS — Z1159 Encounter for screening for other viral diseases: Secondary | ICD-10-CM

## 2022-04-12 DIAGNOSIS — Z0000 Encounter for general adult medical examination without abnormal findings: Secondary | ICD-10-CM

## 2022-04-12 DIAGNOSIS — E7800 Pure hypercholesterolemia, unspecified: Secondary | ICD-10-CM | POA: Diagnosis not present

## 2022-04-12 DIAGNOSIS — I10 Essential (primary) hypertension: Secondary | ICD-10-CM | POA: Diagnosis not present

## 2022-04-12 DIAGNOSIS — R7303 Prediabetes: Secondary | ICD-10-CM | POA: Diagnosis not present

## 2022-04-12 LAB — LIPID PANEL
Cholesterol: 145 mg/dL (ref 0–200)
HDL: 72.3 mg/dL (ref >39.00)
LDL Cholesterol: 61 mg/dL (ref 0–99)
NonHDL: 72.67
Total CHOL/HDL Ratio: 2
Triglycerides: 57 mg/dL (ref 0.0–149.0)
VLDL: 11.4 mg/dL (ref 0.0–40.0)

## 2022-04-12 LAB — HEPATIC FUNCTION PANEL
ALT: 29 U/L (ref 0–35)
AST: 27 U/L (ref 0–37)
Albumin: 4.4 g/dL (ref 3.5–5.2)
Alkaline Phosphatase: 60 U/L (ref 39–117)
Bilirubin, Direct: 0.1 mg/dL (ref 0.0–0.3)
Total Bilirubin: 0.4 mg/dL (ref 0.2–1.2)
Total Protein: 7.1 g/dL (ref 6.0–8.3)

## 2022-04-12 LAB — MICROALBUMIN / CREATININE URINE RATIO
Creatinine,U: 64.4 mg/dL
Microalb Creat Ratio: 1.1 mg/g (ref 0.0–30.0)
Microalb, Ur: <0.7 mg/dL (ref 0.0–1.9)

## 2022-04-12 NOTE — Patient Instructions (Signed)
It was very nice to see you today!  Great job   PLEASE NOTE:  If you had any lab tests please let us know if you have not heard back within a few days. You may see your results on MyChart before we have a chance to review them but we will give you a call once they are reviewed by [name]. If we ordered any referrals today, please let us know if you have not heard from their office within the next week.   Please try these tips to maintain a healthy lifestyle:  Eat most of your calories during the day when you are active. Eliminate processed foods including packaged sweets (pies, cakes, cookies), reduce intake of potatoes, white bread, white pasta, and white rice. Look for whole grain options, oat flour or almond flour.  Each meal should contain half fruits/vegetables, one quarter protein, and one quarter carbs (no bigger than a computer mouse).  Cut down on sweet beverages. This includes juice, soda, and sweet tea. Also watch fruit intake, though this is a healthier sweet option, it still contains natural sugar! Limit to 3 servings daily.  Drink at least 1 glass of water with each meal and aim for at least 8 glasses per day  Exercise at least 150 minutes every week.

## 2022-04-12 NOTE — Progress Notes (Signed)
Phone: [phone]   Subjective:  Patient 71 y.o. female presenting for annual physical.  Chief Complaint  Patient presents with   Annual Exam    Fasting Discuss RSV Vaccine   Annual-at [employer] study-alz-brain training games.  Has advance directives.  Full code PreDM-working hard on diet and now exercise.  Going to [facility].  Stopped ozempic about 1 mo ago.  On losartan.  Oph in dec HLD, FH CAD. Elevated calcium score.  Some DOE.  On rosuvastatin and asa.  See problem oriented charting- ROS- ROS: Gen: no fever, chills  Skin: no rash, itching ENT: no ear pain, ear drainage, nasal congestion, rhinorrhea, sinus pressure, sore throat Eyes: no blurry vision, double vision Resp: no cough, wheeze CV: no CP, palpitations, LE edema,  GI: no heartburn, n/v/d/c, abd pain GU: no dysuria, urgency, frequency, hematuria MSK: no joint pain, myalgias, back pain Neuro: no dizziness, headache, weakness, vertigo Psych: no depression, anxiety, insomnia, SI   The following were reviewed and entered/updated in epic: Past Medical History:  Diagnosis Date   Depression    Dysmenorrhea    Fibroid    Hyperlipemia    Hyperlipidemia    Hypertension    Inverted nipple x several yrs   bilateral, pt states nipples have always been inverted   Joint pain    Obesity    Sleep apnea    SOB (shortness of breath)    Vitamin D deficiency    Patient Active Problem List   Diagnosis Date Noted   Diabetes mellitus ([place]) 04/04/2022   Hypertensive retinopathy 08/31/2021   Chronic right hip pain 11/15/2020   Allergic rhinitis 08/30/2020   Chronic kidney disease due to hypertension 08/30/2020   Type 2 diabetes mellitus with other specified complication ([place]) 62/95/2841   History of malignant neoplasm of skin 08/30/2020   Hyperlipidemia 08/30/2020   Malignant melanoma of face ([place]) 08/30/2020   Class 2 severe obesity with serious comorbidity and body mass index (BMI) of 38.0 to 38.9 in adult [facility])  08/30/2020   Osteopenia 08/30/2020   Personal history of colonic polyps 08/30/2020   Vitamin D deficiency 08/30/2020   Essential hypertension 03/03/2020   Past Surgical History:  Procedure Laterality Date   BLEPHAROPLASTY Bilateral 02/21/2008   BREAST BIOPSY Right    BREAST EXCISIONAL BIOPSY Right    benign   PELVIC LAPAROSCOPY     TOTAL ABDOMINAL HYSTERECTOMY  05/04/2001   [place] bso.  fibroids    Family History  Problem Relation Age of Onset   Hypertension Mother    Alzheimer's disease Mother    Heart disease Mother    Hyperlipidemia Mother    Depression Mother    Obesity Mother    Hypertension Father    Diabetes Father    Stroke Father    Obesity Father    Breast cancer Sister    Hypertension Sister    Breast cancer Sister    Hypertension Sister    Hypertension Sister     Medications- reviewed and updated Current Outpatient Medications  Medication Sig Dispense Refill   aspirin EC 81 MG tablet Take 81 mg by mouth daily.     Calcium Carbonate-Vit D-Min (CALCIUM 1200 PO) Take by mouth daily.     Cholecalciferol (VITAMIN D) 50 MCG (2000 UT) CAPS Take by mouth.     hydrochlorothiazide (HYDRODIURIL) 12.5 MG tablet Take 1 tablet (12.5 mg total) by mouth daily. 90 tablet 3   Insulin Pen Needle (BD PEN NEEDLE NANO U/F) 32G X 4 MM  MISC Use with Saxenda. 100 each 0   losartan (COZAAR) 100 MG tablet Take 1 tablet (100 mg total) by mouth daily. 90 tablet 3   OZEMPIC, 0.25 OR 0.5 MG/DOSE, 2 MG/3ML SOPN Inject 0.25 mg into the skin once a week.     Probiotic Product (PROBIOTIC-10 PO) Take by mouth.     rosuvastatin (CRESTOR) 20 MG tablet Take 1 tablet by mouth daily.     No current facility-administered medications for this visit.    Allergies-reviewed and updated No Known Allergies  Social History   Social History Narrative   Dog   Retired Research scientist (life sciences) companies   Objective  Objective:  BP 120/72   Pulse 64   Temp 98.1 F (36.7 C) (Temporal)   Ht 5'  4" (1.626 m)   Wt 190 lb 4 oz (86.3 kg)   LMP 06/12/2000   SpO2 99%   BMI 32.66 kg/m  Physical Exam  Gen: WDWN NAD.  WF HEENT: NCAT, conjunctiva not injected, sclera nonicteric TM WNL B, OP moist, no exudates  NECK:  supple, no thyromegaly, no nodes, no carotid bruits CARDIAC: RRR, S1S2+, no murmur. DP 2+B LUNGS: CTAB. No wheezes ABDOMEN:  BS+, soft, NTND, No HSM, no masses EXT:  no edema MSK: no gross abnormalities. MS 5/5 all 4 NEURO: A&O x3.  CN II-XII intact.  PSYCH: normal mood. Good eye contact   Diabetic Foot Exam - Simple   Simple Foot Form Diabetic Foot exam was performed with the following findings: Yes 04/12/2022  8:34 AM  Visual Inspection No deformities, no ulcerations, no other skin breakdown bilaterally: Yes Sensation Testing Intact to touch and monofilament testing bilaterally: Yes Pulse Check Posterior Tibialis and Dorsalis pulse intact bilaterally: Yes Comments        Assessment and Plan   Health Maintenance counseling: 1. Anticipatory guidance: Patient counseled regarding regular dental exams q6 months, eye exams yearly, avoiding smoking and second hand smoke, limiting alcohol to 2 beverages per day.   2. Risk factor reduction:  Advised patient of need for regular exercise and diet rich in fruits and vegetables to reduce risk of heart attack and stroke. Exercise- +.   Wt Readings from Last 3 Encounters:  04/12/22 190 lb 4 oz (86.3 kg)  04/04/22 186 lb (84.4 kg)  01/03/22 185 lb 14.4 oz (84.3 kg)   3. Immunizations/screenings/ancillary studies Immunization History  Administered Date(s) Administered   Influenza Split 03/27/2016, 07/13/2017, 04/25/2018, 03/06/2019, 03/08/2020   Influenza, High Dose Seasonal PF 03/10/2022   Influenza-Unspecified 03/27/2016, 04/25/2018, 03/08/2020   PFIZER(Purple Top)SARS-COV-2 Vaccination 07/25/2019, 08/19/2019, 03/08/2020, 11/22/2020   Pfizer Covid-19 Vaccine Bivalent Booster 22yr & up 03/22/2022   Pneumococcal  Conjugate-13 11/07/2016   Pneumococcal Polysaccharide-23 11/19/2017   Tdap 06/25/2018   Zoster Recombinat (Shingrix) 01/03/2017, 06/08/2018   Zoster, Live 01/03/2017, 06/08/2018   Health Maintenance Due  Topic Date Due   OPHTHALMOLOGY EXAM  Never done   Diabetic kidney evaluation - Urine ACR  Never done   Hepatitis C Screening  Never done    4. Skin cancer screening- Iadvised regular sunscreen use. Denies worrisome, changing, or new skin lesions.     Problem List Items Addressed This Visit       Cardiovascular and Mediastinum   Essential hypertension   Relevant Orders   Microalbumin / creatinine urine ratio     Other   Hyperlipidemia   Relevant Orders   Lipid panel   Hepatic function panel   Other Visit Diagnoses  Wellness examination    -  Primary   Prediabetes       Relevant Orders   Microalbumin / creatinine urine ratio   Encounter for hepatitis C screening test for low risk patient       Relevant Orders   Hepatitis C antibody       Recommended follow up: [place] in about 6 months (around 10/11/2022) for htn,hld. Future Appointments  Date Time Provider [place] 07/05/2022  7:20 AM BStarlyn Skeans MD [facility] None     Lab/Order associations:+ fasting   ICD-10-CM   1. Wellness examination  Z00.00     2. Essential hypertension  I10 Microalbumin / creatinine urine ratio    3. Pure hypercholesterolemia  E78.00 Lipid panel    Hepatic function panel    4. Prediabetes  R73.03 Microalbumin / creatinine urine ratio    5. Encounter for hepatitis C screening test for low risk patient  Z11.59 Hepatitis C antibody     Wellness-antic guidance.  No hearing issues.  Has advanced directives.  Rhm utd HTN-chronic.  Controlled. Cont meds HLD-elevated calc score.  Chronic.  Just started crestor '20mg'$ .  Check lipids,lft PreDM-working hard on diet/exercise.  Just stopped ozempic.  Followed by healthy wellness.  They will check A1C few mo F/u 6 mo  No orders  of the defined types were placed in this encounter.    AWellington Hampshire MD

## 2022-04-13 LAB — HEPATITIS C ANTIBODY: Hepatitis C Ab: NONREACTIVE

## 2022-05-09 ENCOUNTER — Telehealth: Payer: Self-pay | Admitting: Family Medicine

## 2022-05-09 NOTE — Telephone Encounter (Signed)
Copied from [place] [id number]. Topic: Medicare AWV >> May 09, 2022 11:31 AM Gillis Santa wrote: Reason for CRM: LVM FOR PATIENT TO CALL KAREN [phone] TO SCHEDULE AWV [place]

## 2022-06-13 DIAGNOSIS — H43812 Vitreous degeneration, left eye: Secondary | ICD-10-CM | POA: Diagnosis not present

## 2022-06-13 DIAGNOSIS — H35033 Hypertensive retinopathy, bilateral: Secondary | ICD-10-CM | POA: Diagnosis not present

## 2022-06-13 DIAGNOSIS — H26493 Other secondary cataract, bilateral: Secondary | ICD-10-CM | POA: Diagnosis not present

## 2022-06-13 DIAGNOSIS — Z961 Presence of intraocular lens: Secondary | ICD-10-CM | POA: Diagnosis not present

## 2022-06-16 ENCOUNTER — Other Ambulatory Visit: Payer: Self-pay | Admitting: *Deleted

## 2022-06-16 ENCOUNTER — Other Ambulatory Visit: Payer: Self-pay | Admitting: Obstetrics and Gynecology

## 2022-06-16 ENCOUNTER — Telehealth: Payer: Self-pay | Admitting: Family Medicine

## 2022-06-16 DIAGNOSIS — Z1231 Encounter for screening mammogram for malignant neoplasm of breast: Secondary | ICD-10-CM

## 2022-06-16 MED ORDER — HYDROCHLOROTHIAZIDE 12.5 MG PO TABS: 12.5000 mg | ORAL_TABLET | Freq: Every day | ORAL | 3 refills | Status: DC

## 2022-06-16 MED ORDER — LOSARTAN POTASSIUM 100 MG PO TABS: 100.0000 mg | ORAL_TABLET | Freq: Every day | ORAL | 3 refills | Status: DC

## 2022-06-16 NOTE — Telephone Encounter (Signed)
Patient states: - [facility] has been sending her medication request for losartan and HCTZ to old PCP  - Old PCP was able to give her a 3 month supply but she wanted pharmacy to recognize new PCP was Dr. Cherlynn Kaiser  Informed pt that last medication were sent to [place] which is probably the reasoning for [employer]'s actions.   Patient requests:  - Medications be sent to [facility] at [place], [place] [address] from now on - Refill of meds currently   LAST APPOINTMENT DATE: 04/12/22  NEXT APPOINTMENT DATE: 10/11/22  MEDICATION: losartan (COZAAR) 100 MG tablet  hydrochlorothiazide (HYDRODIURIL) 12.5 MG tablet [[id number]]   Is the patient out of medication? Yes  PHARMACY: [facility] at [address] [place], [place] [address]

## 2022-06-16 NOTE — Telephone Encounter (Signed)
Patient aware that medication was sent to the pharmacy earlier today. Patient verbalized understanding.

## 2022-07-05 ENCOUNTER — Encounter (INDEPENDENT_AMBULATORY_CARE_PROVIDER_SITE_OTHER): Payer: Self-pay | Admitting: Family Medicine

## 2022-07-05 ENCOUNTER — Ambulatory Visit (INDEPENDENT_AMBULATORY_CARE_PROVIDER_SITE_OTHER): Payer: Medicare HMO | Admitting: Family Medicine

## 2022-07-05 VITALS — BP 123/66 | HR 71 | Temp 97.8°F | Ht 64.0 in | Wt 193.0 lb

## 2022-07-05 DIAGNOSIS — Z6834 Body mass index (BMI) 34.0-34.9, adult: Secondary | ICD-10-CM | POA: Insufficient documentation

## 2022-07-05 DIAGNOSIS — E1169 Type 2 diabetes mellitus with other specified complication: Secondary | ICD-10-CM

## 2022-07-05 DIAGNOSIS — E669 Obesity, unspecified: Secondary | ICD-10-CM

## 2022-07-05 DIAGNOSIS — E7800 Pure hypercholesterolemia, unspecified: Secondary | ICD-10-CM | POA: Diagnosis not present

## 2022-07-05 DIAGNOSIS — E559 Vitamin D deficiency, unspecified: Secondary | ICD-10-CM | POA: Diagnosis not present

## 2022-07-05 DIAGNOSIS — Z6836 Body mass index (BMI) 36.0-36.9, adult: Secondary | ICD-10-CM | POA: Insufficient documentation

## 2022-07-05 DIAGNOSIS — Z7985 Long-term (current) use of injectable non-insulin antidiabetic drugs: Secondary | ICD-10-CM

## 2022-07-05 DIAGNOSIS — Z6833 Body mass index (BMI) 33.0-33.9, adult: Secondary | ICD-10-CM

## 2022-07-07 LAB — CMP14+EGFR
ALT: 20 IU/L (ref 0–32)
AST: 19 IU/L (ref 0–40)
Albumin/Globulin Ratio: 2.1 (ref 1.2–2.2)
Albumin: 4.5 g/dL (ref 3.9–4.9)
Alkaline Phosphatase: 61 IU/L (ref 44–121)
BUN/Creatinine Ratio: 25 (ref 12–28)
BUN: 19 mg/dL (ref 8–27)
Bilirubin Total: 0.3 mg/dL (ref 0.0–1.2)
CO2: 24 mmol/L (ref 20–29)
Calcium: 9.6 mg/dL (ref 8.7–10.3)
Chloride: 104 mmol/L (ref 96–106)
Creatinine, Ser: 0.76 mg/dL (ref 0.57–1.00)
Globulin, Total: 2.1 g/dL (ref 1.5–4.5)
Glucose: 104 mg/dL — ABNORMAL HIGH (ref 70–99)
Potassium: 4.5 mmol/L (ref 3.5–5.2)
Sodium: 140 mmol/L (ref 134–144)
Total Protein: 6.6 g/dL (ref 6.0–8.5)
eGFR: 84 mL/min/{1.73_m2} (ref >59)

## 2022-07-07 LAB — HEMOGLOBIN A1C
Est. average glucose Bld gHb Est-mCnc: 114 mg/dL
Hgb A1c MFr Bld: 5.6 % (ref 4.8–5.6)

## 2022-07-07 LAB — LIPID PANEL WITH LDL/HDL RATIO
Cholesterol, Total: 162 mg/dL (ref 100–199)
HDL: 83 mg/dL (ref >39)
LDL Chol Calc (NIH): 67 mg/dL (ref 0–99)
LDL/HDL Ratio: 0.8 ratio (ref 0.0–3.2)
Triglycerides: 63 mg/dL (ref 0–149)
VLDL Cholesterol Cal: 12 mg/dL (ref 5–40)

## 2022-07-07 LAB — VITAMIN D 25 HYDROXY (VIT D DEFICIENCY, FRACTURES): Vit D, 25-Hydroxy: 49.9 ng/mL (ref 30.0–100.0)

## 2022-07-07 LAB — INSULIN, RANDOM: INSULIN: 8.3 u[IU]/mL (ref 2.6–24.9)

## 2022-07-19 DIAGNOSIS — Z961 Presence of intraocular lens: Secondary | ICD-10-CM | POA: Diagnosis not present

## 2022-07-19 DIAGNOSIS — H43813 Vitreous degeneration, bilateral: Secondary | ICD-10-CM | POA: Diagnosis not present

## 2022-07-19 DIAGNOSIS — H26493 Other secondary cataract, bilateral: Secondary | ICD-10-CM | POA: Diagnosis not present

## 2022-07-19 NOTE — Progress Notes (Unsigned)
Chief Complaint:   OBESITY Tina Sutton is here to discuss her progress with her obesity treatment plan along with follow-up of her obesity related diagnoses. Tina Sutton is on practicing portion control and making smarter food choices, such as increasing vegetables and decreasing simple carbohydrates and states she is following her eating plan approximately 85% of the time. Tina Sutton states she is doing water exercises for 60 minutes 2 times per week.  Today's visit was #: 25 Starting weight: 226 lbs Starting date: 12/06/2019 Today's weight: 193 lbs Today's date: 07/05/2022 Total lbs lost to date: 33 Total lbs lost since last in-office visit: 0  Interim History: Tina Sutton's last visit was approximately 4 months ago.  She has gained weight and she is working on getting back on track.  She has increased her exercise including cardio and strengthening.  Subjective:   1. Type 2 diabetes mellitus with other specified complication, without long-term current use of insulin (HCC) Tina Sutton stopped her Ozempic and she is trying to control with her diet.  She notes significant polyphagia.  2. Vitamin D deficiency Tobin is on vitamin D prescription, and she is due to have labs checked today.  No side effects were noted.  3. Pure hypercholesterolemia Tina Sutton is working on decreasing cholesterol in her diet, as well as weight loss and exercise.  Assessment/Plan:   1. Type 2 diabetes mellitus with other specified complication, without long-term current use of insulin (HCC) We will check labs today and we will follow-up. Tina Sutton is to continue to decrease simple carbohydrates in her diet.  - CMP14+EGFR - Insulin, random - Hemoglobin A1c  2. Vitamin D deficiency We will check labs today, and Tina Sutton will continue Vitamin D prescription.   - VITAMIN D 25 Hydroxy (Vit-D Deficiency, Fractures)  3. Pure hypercholesterolemia We will check labs today to follow progress. Tina Sutton will continue with her  diet, exercise, and weight loss.   - Lipid Panel With LDL/HDL Ratio  4. BMI 33.0-33.9,adult  5. Obesity, Beginning BMI 38.79 Tina Sutton is currently in the action stage of change. As such, her goal is to get back to weightloss efforts . She has agreed to the Category 2 Plan or following a lower carbohydrate, vegetable and lean protein rich diet plan.   Exercise goals: As is.   Behavioral modification strategies: increasing lean protein intake.  Tina Sutton has agreed to follow-up with our clinic in 3 months. She was informed of the importance of frequent follow-up visits to maximize her success with intensive lifestyle modifications for her multiple health conditions.   Tina Sutton was informed we would discuss her lab results at her next visit unless there is a critical issue that needs to be addressed sooner. Tina Sutton agreed to keep her next visit at the agreed upon time to discuss these results.  Objective:   Blood pressure 123/66, pulse 71, temperature 97.8 F (36.6 C), height 5' 4"$  (1.626 m), weight 193 lb (87.5 kg), last menstrual period 06/12/2000, SpO2 96 %. Body mass index is 33.13 kg/m.  General: Cooperative, alert, well developed, in no acute distress. HEENT: Conjunctivae and lids unremarkable. Cardiovascular: Regular rhythm.  Lungs: Normal work of breathing. Neurologic: No focal deficits.   Lab Results  Component Value Date   CREATININE 0.76 07/05/2022   BUN 19 07/05/2022   NA 140 07/05/2022   K 4.5 07/05/2022   CL 104 07/05/2022   CO2 24 07/05/2022   Lab Results  Component Value Date   ALT 20 07/05/2022   AST 19 07/05/2022  ALKPHOS 61 07/05/2022   BILITOT 0.3 07/05/2022   Lab Results  Component Value Date   HGBA1C 5.6 07/05/2022   HGBA1C 5.3 11/24/2021   HGBA1C 5.1 07/04/2021   HGBA1C 5.6 03/07/2021   HGBA1C 5.4 10/18/2020   Lab Results  Component Value Date   INSULIN 8.3 07/05/2022   INSULIN 12.6 12/27/2021   INSULIN 8.8 07/04/2021   INSULIN 10.1  03/07/2021   INSULIN 13.6 10/18/2020   Lab Results  Component Value Date   TSH 2.10 11/24/2021   Lab Results  Component Value Date   CHOL 162 07/05/2022   HDL 83 07/05/2022   LDLCALC 67 07/05/2022   TRIG 63 07/05/2022   CHOLHDL 2 04/12/2022   Lab Results  Component Value Date   VD25OH 49.9 07/05/2022   VD25OH 55.7 12/27/2021   VD25OH 65.4 07/04/2021   Lab Results  Component Value Date   WBC 6.0 11/24/2021   HGB 12.6 11/24/2021   HCT 37.7 11/24/2021   MCV 87.2 11/24/2021   PLT 218.0 11/24/2021   No results found for: "IRON", "TIBC", "FERRITIN"  Attestation Statements:   Reviewed by clinician on day of visit: allergies, medications, problem list, medical history, surgical history, family history, social history, and previous encounter notes.   I, Trixie Dredge, am acting as transcriptionist for [name], MD.  I have reviewed the above documentation for accuracy and completeness, and I agree with the above. -  [name], MD

## 2022-07-21 ENCOUNTER — Encounter: Payer: Self-pay | Admitting: Obstetrics and Gynecology

## 2022-08-02 ENCOUNTER — Ambulatory Visit (INDEPENDENT_AMBULATORY_CARE_PROVIDER_SITE_OTHER): Payer: Medicare HMO | Admitting: Family Medicine

## 2022-08-02 ENCOUNTER — Ambulatory Visit: Payer: Self-pay

## 2022-08-02 ENCOUNTER — Ambulatory Visit (INDEPENDENT_AMBULATORY_CARE_PROVIDER_SITE_OTHER): Payer: Medicare HMO

## 2022-08-02 VITALS — BP 120/76 | HR 72 | Ht 64.0 in | Wt 198.0 lb

## 2022-08-02 DIAGNOSIS — G8929 Other chronic pain: Secondary | ICD-10-CM

## 2022-08-02 DIAGNOSIS — M25561 Pain in right knee: Secondary | ICD-10-CM | POA: Diagnosis not present

## 2022-08-02 NOTE — Patient Instructions (Addendum)
Thank you for coming in today.   Please get an Xray today before you leave   I've referred you to Physical Therapy.  Let us know if you don't hear from them in one week.   Check back in 1 month prior to your trip

## 2022-08-02 NOTE — Progress Notes (Signed)
   I, Peterson Lombard, LAT, ATC acting as a scribe for [name], MD.  Tina Sutton is a 72 y.o. female who presents to [place] at [facility] today for R knee pain. Pt was previously seen by Dr. Georgina Snell in 2022 for R hip and R knee pain and was advised to use Voltaren gel and was referred to PT. Today, pt c/o R knee pain worsened on Saturday after contra dancing. Pt is wanting to cont being able to dance, because she finds it very enjoyable. Pt is also traveling to [place] next month.   R Knee swelling: no Mechanical symptoms: no Aggravates: stairs prolonged sitting and standing.  Getting in and out of a car. Treatments tried: none  Dx imaging: 08/23/20 R knee XR  Pertinent review of systems: No fevers or chills  Relevant historical information: Diabetes.  History of melanoma.  Hypertension.   Exam:  BP 120/76   Pulse 72   Ht 5' 4"$  (1.626 m)   Wt 198 lb (89.8 kg)   LMP 06/12/2000   SpO2 99%   BMI 33.99 kg/m  General: Well Developed, well nourished, and in no acute distress.   MSK: Right knee: Normal-appearing Normal motion with retropatellar crepitation. Stable ligamentous exam. Intact strength pain with resisted knee extension.   Lab and Radiology Results  X-ray images right knee obtained today personally and independently interpreted Mild patellofemoral DJD.  No acute fractures. Await formal radiology review    Assessment and Plan: 72 y.o. 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  She is a good candidate for physical therapy to work on quad strength.  That will be the long-term goal.  She has a trip to [place] in 1 month so I do not have a lot of time to get her knee better before that.  Recommend rescheduling with me in about 3 weeks so that if she needs a knee injection we have time to do 1 before she goes to [place].  In the meantime physical therapy referral Voltaren gel and Tylenol. Chronic issue with acute  exacerbation.  PDMP not reviewed this encounter. Orders Placed This Encounter  Procedures   DG Knee AP/LAT W/Sunrise Right    Standing Status:   Future    Number of Occurrences:   1    Standing Expiration Date:   08/31/2022    Order Specific Question:   Reason for Exam (SYMPTOM  OR DIAGNOSIS REQUIRED)    Answer:   right knee pain    Order Specific Question:   Preferred imaging location?    Answer:   [facility]   Ambulatory referral to Physical Therapy    Referral Priority:   Routine    Referral Type:   Physical Medicine    Referral Reason:   Specialty Services Required    Requested Specialty:   Physical Therapy    Number of Visits Requested:   1   No orders of the defined types were placed in this encounter.    Discussed warning signs or symptoms. Please see discharge instructions. Patient expresses understanding.   The above documentation has been reviewed and is accurate and complete [name], M.D.

## 2022-08-03 ENCOUNTER — Ambulatory Visit (INDEPENDENT_AMBULATORY_CARE_PROVIDER_SITE_OTHER): Payer: Medicare HMO | Admitting: Physical Therapy

## 2022-08-03 ENCOUNTER — Encounter: Payer: Self-pay | Admitting: Physical Therapy

## 2022-08-03 DIAGNOSIS — M25561 Pain in right knee: Secondary | ICD-10-CM | POA: Diagnosis not present

## 2022-08-03 DIAGNOSIS — G8929 Other chronic pain: Secondary | ICD-10-CM | POA: Diagnosis not present

## 2022-08-03 DIAGNOSIS — M6281 Muscle weakness (generalized): Secondary | ICD-10-CM | POA: Diagnosis not present

## 2022-08-03 NOTE — Therapy (Signed)
OUTPATIENT PHYSICAL THERAPY LOWER EXTREMITY EVALUATION   Patient Name: Tina Sutton MRN: [id number] DOB:12-27-1950, 72 y.o., female Today's Date: 08/03/2022  END OF SESSION:  PT End of Session - 08/03/22 1437     Visit Number 1    Number of Visits 12    Date for PT Re-Evaluation 09/28/22    PT Start Time [id number]    PT Stop Time [id number]    PT Time Calculation (min) 38 min    Behavior During Therapy WFL for tasks assessed/performed             Past Medical History:  Diagnosis Date   Depression    Dysmenorrhea    Fibroid    Hyperlipemia    Hyperlipidemia    Hypertension    Inverted nipple x several yrs   bilateral, pt states nipples have always been inverted   Joint pain    Obesity    Sleep apnea    SOB (shortness of breath)    Vitamin D deficiency    Past Surgical History:  Procedure Laterality Date   BLEPHAROPLASTY Bilateral 02/21/2008   BREAST BIOPSY Right    BREAST EXCISIONAL BIOPSY Right    benign   PELVIC LAPAROSCOPY     TOTAL ABDOMINAL HYSTERECTOMY  05/04/2001   [place].  fibroids   Patient Active Problem List   Diagnosis Date Noted   BMI 33.0-33.9,adult 07/05/2022   Obesity, Beginning BMI 38.79 07/05/2022   Diabetes mellitus ([place]) 04/04/2022   Hypertensive retinopathy 08/31/2021   Chronic right hip pain 11/15/2020   Allergic rhinitis 08/30/2020   Chronic kidney disease due to hypertension 08/30/2020   Type 2 diabetes mellitus with other specified complication ([place]) 99991111   History of malignant neoplasm of skin 08/30/2020   Hyperlipidemia 08/30/2020   Malignant melanoma of face ([place]) 08/30/2020   Class 2 severe obesity with serious comorbidity and body mass index (BMI) of 38.0 to 38.9 in adult [facility]) 08/30/2020   Osteopenia 08/30/2020   Personal history of colonic polyps 08/30/2020   Vitamin D deficiency 08/30/2020   Essential hypertension 03/03/2020    PCP: [name] Crook, MD  REFERRING PROVIDER: Gregor Hams, MD  REFERRING DIAG:  [id number] (ICD-10-CM) - Chronic pain of right knee  THERAPY DIAG:  Muscle weakness (generalized) - Plan: PT plan of care cert/re-cert  Chronic pain of right knee - Plan: PT plan of care cert/re-cert  Rationale for Evaluation and Treatment: Rehabilitation  ONSET DATE: >10 years  SUBJECTIVE:   SUBJECTIVE STATEMENT: States she has had right knee pain for over a decade. States that recently she was contra dancing and had right knee pain on the front of the knee. States for the most part it doesn't bother her. States that sometimes she  is more aware of it. States that stairs bother her. States she wants to stay mobile and to prevent injury   PERTINENT HISTORY: Hx right knee pain and right hip pain., right torn labrum  PAIN:  Are you having pain? Yes: NPRS scale: 5 at worse /10 Pain location: anterior right knee Pain description: sharp, Aggravating factors: stairs, dancing Relieving factors: rest  PRECAUTIONS: None  WEIGHT BEARING RESTRICTIONS: No  FALLS:  Has patient fallen in last 6 months? No   OCCUPATION: not working  PLOF: Independent  PATIENT GOALS: to be able to walk around [place] and to be able to continue to contr-dance.    OBJECTIVE:   DIAGNOSTIC FINDINGS: right knee xray - no report yet    COGNITION: Overall  cognitive status: Within functional limits for tasks assessed     SENSATION: WFL  EDEMA:  B swelling but equal    POSTURE: anterior pelvic tilt and flexed trunk   PALPATION: no significant tenderness noted in either leg    LE Measurements Lower Extremity Right 08/03/2022 Left 08/03/2022   A/PROM MMT A/PROM MMT  Hip Flexion      Hip Extension      Hip Abduction      Hip Adduction      Hip Internal rotation      Hip External rotation      Knee Flexion WFL 3+ WFL 4  Knee Extension WFL* 3+ WFL 4  Ankle Dorsiflexion      Ankle Plantarflexion      Ankle Inversion      Ankle Eversion       (Blank rows = not tested) *  pain   LOWER EXTREMITY SPECIAL TESTS:  + ely's B L>R    GAIT: Distance walked: 25 ft Assistive device utilized: None Level of assistance: Complete Independence Comments: right foot reduced DF compared to left, wider base of support, limited hip motion R>L   TODAY'S TREATMENT:                                                                                                                              DATE:   08/03/2022  Therapeutic Exercise:  Aerobic: Supine: Prone: hamstring curl x5 B  Seated: self mobilization with tiger tail to right LE   Standing: Neuromuscular Re-education: Manual Therapy: Therapeutic Activity: Self Care: Trigger Point Dry Needling:  Modalities:    PATIENT EDUCATION:  Education details: on current presentation, on HEP, on clinical outcomes score and POC Person educated: Patient Education method: Explanation, Demonstration, and Handouts Education comprehension: verbalized understanding   HOME EXERCISE PROGRAM: [id number]  ASSESSMENT:  CLINICAL IMPRESSION: Patient presents to physical therapy with chronic right knee pain that has been going on for over 10 years.  Patient limits movements and motions on the right leg secondary to history of pain.  Patient recently returned to contra dancing which focuses on unilateral movements and patient reported increased pain afterwards.  Patient demonstrates reduced strength and range of motion in right lower extremity and would greatly benefit from skilled physical therapy at this time to improve overall function and quality of life.  OBJECTIVE IMPAIRMENTS: decreased activity tolerance, decreased balance, difficulty walking, decreased ROM, decreased strength, postural dysfunction, and pain.   ACTIVITY LIMITATIONS: lifting, transfers, and locomotion level  PARTICIPATION LIMITATIONS: community activity and dance class  PERSONAL FACTORS: Age, Education, Fitness, Past/current experiences, Time since onset of  injury/illness/exacerbation, and 1 comorbidity: chronic right hip and knee pain  are also affecting patient's functional outcome.   REHAB POTENTIAL: Good  CLINICAL DECISION MAKING: Stable/uncomplicated  EVALUATION COMPLEXITY: Low   GOALS: Goals reviewed with patient? yes  SHORT TERM GOALS: Target date: 08/31/2022  Patient will be independent in self management strategies to  improve quality of life and functional outcomes. Baseline: New Program Goal status: INITIAL  2.  Patient will report at least 50% improvement in overall symptoms and/or function to demonstrate improved functional mobility Baseline: 0% better Goal status: INITIAL  3.  Patient will be able to demonstrate pain-free knee range of motion Baseline: Painful on right Goal status: INITIAL     LONG TERM GOALS: Target date: 09/28/2022   Patient will report at least 75% improvement in overall symptoms and/or function to demonstrate improved functional mobility Baseline: 0% better Goal status: INITIAL  2.  Patient will be able to contraband without pain in right lower extremity Baseline: Painful Goal status: INITIAL  3.  Patient will be able to a send and descend 4 inch steps with reciprocal gait pattern and use of handrail as needed without pain to demonstrate improved function Baseline: 1 at a time Goal status: INITIAL    PLAN:  PT FREQUENCY: 1-2x/week for total of 12 visits  PT DURATION: 8 weeks  PLANNED INTERVENTIONS: Therapeutic exercises, Therapeutic activity, Neuromuscular re-education, Balance training, Gait training, Patient/Family education, Self Care, Joint mobilization, Joint manipulation, Vestibular training, Canalith repositioning, Orthotic/Fit training, DME instructions, Aquatic Therapy, Dry Needling, Electrical stimulation, Spinal manipulation, Spinal mobilization, Cryotherapy, Moist heat, Traction, Ultrasound, Ionotophoresis 35m/ml Dexamethasone, Manual therapy, and Re-evaluation  PLAN FOR  NEXT SESSION: quad strengthening, LE strengthening, stair training, strategies to reduce stress on right leg [place] in [place]  4:06 PM, 08/03/22 MJerene Pitch DPT [facility]

## 2022-08-07 ENCOUNTER — Encounter: Payer: Self-pay | Admitting: Physical Therapy

## 2022-08-07 ENCOUNTER — Ambulatory Visit
Admission: RE | Admit: 2022-08-07 | Discharge: 2022-08-07 | Disposition: A | Discharge status: Home / Self Care | Payer: Medicare HMO | Source: Ambulatory Visit | Attending: Obstetrics and Gynecology | Admitting: Obstetrics and Gynecology

## 2022-08-07 ENCOUNTER — Ambulatory Visit (INDEPENDENT_AMBULATORY_CARE_PROVIDER_SITE_OTHER): Payer: Medicare HMO | Admitting: Physical Therapy

## 2022-08-07 DIAGNOSIS — Z1231 Encounter for screening mammogram for malignant neoplasm of breast: Secondary | ICD-10-CM

## 2022-08-07 DIAGNOSIS — M6281 Muscle weakness (generalized): Secondary | ICD-10-CM | POA: Diagnosis not present

## 2022-08-07 DIAGNOSIS — M25561 Pain in right knee: Secondary | ICD-10-CM

## 2022-08-07 DIAGNOSIS — G8929 Other chronic pain: Secondary | ICD-10-CM

## 2022-08-07 NOTE — Progress Notes (Signed)
Right knee x-ray shows mild arthritis.

## 2022-08-07 NOTE — Therapy (Signed)
OUTPATIENT PHYSICAL THERAPY TREATMENT NOTE   Patient Name: Tina Sutton MRN: [id number] DOB:Oct 03, 1950, 72 y.o., female Today's Date: 08/07/2022  PCP: [name] Crook, MD   REFERRING PROVIDER: Gregor Hams, MD  END OF SESSION:   PT End of Session - 08/07/22 1431     Visit Number 2    Number of Visits 12    Date for PT Re-Evaluation 09/28/22    PT Start Time [id number]    PT Stop Time 1511    PT Time Calculation (min) 38 min    Behavior During Therapy [facility] for tasks assessed/performed             Past Medical History:  Diagnosis Date   Depression    Dysmenorrhea    Fibroid    Hyperlipemia    Hyperlipidemia    Hypertension    Inverted nipple x several yrs   bilateral, pt states nipples have always been inverted   Joint pain    Obesity    Sleep apnea    SOB (shortness of breath)    Vitamin D deficiency    Past Surgical History:  Procedure Laterality Date   BLEPHAROPLASTY Bilateral 02/21/2008   BREAST BIOPSY Right    BREAST EXCISIONAL BIOPSY Right    benign   PELVIC LAPAROSCOPY     TOTAL ABDOMINAL HYSTERECTOMY  05/04/2001   [place]  fibroids   Patient Active Problem List   Diagnosis Date Noted   BMI 33.0-33.9,adult 07/05/2022   Obesity, Beginning BMI 38.79 07/05/2022   Diabetes mellitus ([place]) 04/04/2022   Hypertensive retinopathy 08/31/2021   Chronic right hip pain 11/15/2020   Allergic rhinitis 08/30/2020   Chronic kidney disease due to hypertension 08/30/2020   Type 2 diabetes mellitus with other specified complication ([place]) 99991111   History of malignant neoplasm of skin 08/30/2020   Hyperlipidemia 08/30/2020   Malignant melanoma of face ([place]) 08/30/2020   Class 2 severe obesity with serious comorbidity and body mass index (BMI) of 38.0 to 38.9 in adult [facility]) 08/30/2020   Osteopenia 08/30/2020   Personal history of colonic polyps 08/30/2020   Vitamin D deficiency 08/30/2020   Essential hypertension 03/03/2020    THERAPY DIAG:  Muscle  weakness (generalized)  Chronic pain of right knee    REFERRING DIAG: M25.561,G89.29 (ICD-10-CM) - Chronic pain of right knee    Rationale for Evaluation and Treatment: Rehabilitation   ONSET DATE: >10 years   SUBJECTIVE:    SUBJECTIVE STATEMENT: 08/07/2022 States that she did her exercises and she has questions she would like to go over.   Eval: States she has had right knee pain for over a decade. States that recently she was contra dancing and had right knee pain on the front of the knee. States for the most part it doesn't bother her. States that sometimes she  is more aware of it. States that stairs bother her. States she wants to stay mobile and to prevent injury     PERTINENT HISTORY: Hx right knee pain and right hip pain., right torn labrum  PAIN:  Are you having pain? no: NPRS scale: 0 /10 Pain location: anterior right knee Pain description: sharp, Aggravating factors: stairs, dancing Relieving factors: rest   PRECAUTIONS: None   WEIGHT BEARING RESTRICTIONS: No   FALLS:  Has patient fallen in last 6 months? No     OCCUPATION: not working   PLOF: Independent   PATIENT GOALS: to be able to walk around [place] and to be able to continue to  contr-dance.      OBJECTIVE:    DIAGNOSTIC FINDINGS: right knee xray - no report yet       COGNITION: Overall cognitive status: Within functional limits for tasks assessed                         SENSATION: WFL   EDEMA:  B swelling but equal       POSTURE: anterior pelvic tilt and flexed trunk    PALPATION: no significant tenderness noted in either leg       LE Measurements       Lower Extremity Right 08/03/2022 Left 08/03/2022    A/PROM MMT A/PROM MMT  Hip Flexion          Hip Extension          Hip Abduction          Hip Adduction          Hip Internal rotation          Hip External rotation          Knee Flexion WFL 3+ WFL 4  Knee Extension WFL* 3+ WFL 4  Ankle Dorsiflexion          Ankle  Plantarflexion          Ankle Inversion          Ankle Eversion           (Blank rows = not tested) * pain     LOWER EXTREMITY SPECIAL TESTS:  + ely's B L>R       GAIT: Distance walked: 25 ft Assistive device utilized: None Level of assistance: Complete Independence Comments: right foot reduced DF compared to left, wider base of support, limited hip motion R>L     TODAY'S TREATMENT:                                                                                                                              DATE:   08/07/2022    Therapeutic Exercise:    Aerobic: Supine: SLR with quad set 4x5 B, SAQs black bolster x20 B painfree ROM, bridges 4x5, thomas stretch x3 30" holds B  Prone: hamstring curl x5 B- reviewed    Seated: self mobilization with tiger tail to right LE -reviewed, LAQs x10 - stopped painful    Standing: Neuromuscular Re-education: Manual Therapy: Therapeutic Activity: Self Care: Trigger Point Dry Needling:  Modalities:      PATIENT EDUCATION:  Education details: on HEP and how to perform exercises. On compression garments and swelling in legs  Person educated: Patient Education method: Explanation, Demonstration, and Handouts Education comprehension: verbalized understanding     HOME EXERCISE PROGRAM: [id number]   ASSESSMENT:   CLINICAL IMPRESSION: 08/07/2022 Session focused on answering all questions about current POC and adding new exercises to HEP. Overall tolerated exercises moderately well but slight knee pain with LAQS and SAQS.SAQS were more tolerable with  reduced range of motion. Slight back pain with bridges but this resolved with form and cues to squeeze butt. Fatigue end of session and no residual pain. Will continue with current POC as tolerated.   Eval: Patient presents to physical therapy with chronic right knee pain that has been going on for over 10 years.  Patient limits movements and motions on the right leg secondary to history of  pain.  Patient recently returned to contra dancing which focuses on unilateral movements and patient reported increased pain afterwards.  Patient demonstrates reduced strength and range of motion in right lower extremity and would greatly benefit from skilled physical therapy at this time to improve overall function and quality of life.   OBJECTIVE IMPAIRMENTS: decreased activity tolerance, decreased balance, difficulty walking, decreased ROM, decreased strength, postural dysfunction, and pain.    ACTIVITY LIMITATIONS: lifting, transfers, and locomotion level   PARTICIPATION LIMITATIONS: community activity and dance class   PERSONAL FACTORS: Age, Education, Fitness, Past/current experiences, Time since onset of injury/illness/exacerbation, and 1 comorbidity: chronic right hip and knee pain  are also affecting patient's functional outcome.    REHAB POTENTIAL: Good   CLINICAL DECISION MAKING: Stable/uncomplicated   EVALUATION COMPLEXITY: Low     GOALS: Goals reviewed with patient? yes   SHORT TERM GOALS: Target date: 08/31/2022  Patient will be independent in self management strategies to improve quality of life and functional outcomes. Baseline: New Program Goal status: INITIAL   2.  Patient will report at least 50% improvement in overall symptoms and/or function to demonstrate improved functional mobility Baseline: 0% better Goal status: INITIAL   3.  Patient will be able to demonstrate pain-free knee range of motion Baseline: Painful on right Goal status: INITIAL         LONG TERM GOALS: Target date: 09/28/2022    Patient will report at least 75% improvement in overall symptoms and/or function to demonstrate improved functional mobility Baseline: 0% better Goal status: INITIAL   2.  Patient will be able to contraband without pain in right lower extremity Baseline: Painful Goal status: INITIAL   3.  Patient will be able to a send and descend 4 inch steps with reciprocal  gait pattern and use of handrail as needed without pain to demonstrate improved function Baseline: 1 at a time Goal status: INITIAL       PLAN:   PT FREQUENCY: 1-2x/week for total of 12 visits   PT DURATION: 8 weeks   PLANNED INTERVENTIONS: Therapeutic exercises, Therapeutic activity, Neuromuscular re-education, Balance training, Gait training, Patient/Family education, Self Care, Joint mobilization, Joint manipulation, Vestibular training, Canalith repositioning, Orthotic/Fit training, DME instructions, Aquatic Therapy, Dry Needling, Electrical stimulation, Spinal manipulation, Spinal mobilization, Cryotherapy, Moist heat, Traction, Ultrasound, Ionotophoresis '4mg'$ /ml Dexamethasone, Manual therapy, and Re-evaluation   PLAN FOR NEXT SESSION: quad strengthening, LE strengthening, stair training, strategies to reduce stress on right leg while in [place]     3:15 PM, 08/07/22 Jerene Pitch, DPT Physical Therapy with [facility]

## 2022-08-10 ENCOUNTER — Ambulatory Visit (INDEPENDENT_AMBULATORY_CARE_PROVIDER_SITE_OTHER): Payer: Medicare HMO | Admitting: Physical Therapy

## 2022-08-10 ENCOUNTER — Encounter: Payer: Self-pay | Admitting: Physical Therapy

## 2022-08-10 DIAGNOSIS — M6281 Muscle weakness (generalized): Secondary | ICD-10-CM | POA: Diagnosis not present

## 2022-08-10 DIAGNOSIS — M25561 Pain in right knee: Secondary | ICD-10-CM | POA: Diagnosis not present

## 2022-08-10 DIAGNOSIS — G8929 Other chronic pain: Secondary | ICD-10-CM | POA: Diagnosis not present

## 2022-08-10 NOTE — Therapy (Signed)
OUTPATIENT PHYSICAL THERAPY TREATMENT NOTE   Patient Name: Tina Sutton MRN: [id number] DOB:1950/10/13, 72 y.o., female Today's Date: 08/10/2022  PCP: [name] Crook, MD   REFERRING PROVIDER: Gregor Hams, MD  END OF SESSION:   PT End of Session - 08/10/22 1212     Visit Number 3    Number of Visits 12    Date for PT Re-Evaluation 09/28/22    PT Start Time 1215    PT Stop Time 1255    PT Time Calculation (min) 40 min    Behavior During Therapy [facility] for tasks assessed/performed             Past Medical History:  Diagnosis Date   Depression    Dysmenorrhea    Fibroid    Hyperlipemia    Hyperlipidemia    Hypertension    Inverted nipple x several yrs   bilateral, pt states nipples have always been inverted   Joint pain    Obesity    Sleep apnea    SOB (shortness of breath)    Vitamin D deficiency    Past Surgical History:  Procedure Laterality Date   BLEPHAROPLASTY Bilateral 02/21/2008   BREAST BIOPSY Right    BREAST EXCISIONAL BIOPSY Right    benign   PELVIC LAPAROSCOPY     TOTAL ABDOMINAL HYSTERECTOMY  05/04/2001   [place].  fibroids   Patient Active Problem List   Diagnosis Date Noted   BMI 33.0-33.9,adult 07/05/2022   Obesity, Beginning BMI 38.79 07/05/2022   Diabetes mellitus ([place]) 04/04/2022   Hypertensive retinopathy 08/31/2021   Chronic right hip pain 11/15/2020   Allergic rhinitis 08/30/2020   Chronic kidney disease due to hypertension 08/30/2020   Type 2 diabetes mellitus with other specified complication ([place]) 99991111   History of malignant neoplasm of skin 08/30/2020   Hyperlipidemia 08/30/2020   Malignant melanoma of face ([place]) 08/30/2020   Class 2 severe obesity with serious comorbidity and body mass index (BMI) of 38.0 to 38.9 in adult [facility]) 08/30/2020   Osteopenia 08/30/2020   Personal history of colonic polyps 08/30/2020   Vitamin D deficiency 08/30/2020   Essential hypertension 03/03/2020    THERAPY DIAG:  Muscle  weakness (generalized)  Chronic pain of right knee    REFERRING DIAG: M25.561,G89.29 (ICD-10-CM) - Chronic pain of right knee    Rationale for Evaluation and Treatment: Rehabilitation   ONSET DATE: >10 years   SUBJECTIVE:    SUBJECTIVE STATEMENT: 08/10/2022 States that she has not done her exercises since her last session. States she has been busy. States that she stepped off the curb with her other knee and felt that her knee was still not better.   Eval: States she has had right knee pain for over a decade. States that recently she was contra dancing and had right knee pain on the front of the knee. States for the most part it doesn't bother her. States that sometimes she  is more aware of it. States that stairs bother her. States she wants to stay mobile and to prevent injury     PERTINENT HISTORY: Hx right knee pain and right hip pain., right torn labrum  PAIN:  Are you having pain? no: NPRS scale: 0 /10 Pain location: anterior right knee Pain description: sharp, Aggravating factors: stairs, dancing Relieving factors: rest   PRECAUTIONS: None   WEIGHT BEARING RESTRICTIONS: No   FALLS:  Has patient fallen in last 6 months? No     OCCUPATION: not working  PLOF: Independent   PATIENT GOALS: to be able to walk around [place] and to be able to continue to contr-dance.      OBJECTIVE:    DIAGNOSTIC FINDINGS: right knee xray - no report yet       COGNITION: Overall cognitive status: Within functional limits for tasks assessed                         SENSATION: WFL   EDEMA:  B swelling but equal       POSTURE: anterior pelvic tilt and flexed trunk    PALPATION: no significant tenderness noted in either leg       LE Measurements       Lower Extremity Right 08/03/2022 Left 08/03/2022    A/PROM MMT A/PROM MMT  Hip Flexion          Hip Extension          Hip Abduction          Hip Adduction          Hip Internal rotation          Hip External  rotation          Knee Flexion WFL 3+ WFL 4  Knee Extension WFL* 3+ WFL 4  Ankle Dorsiflexion          Ankle Plantarflexion          Ankle Inversion          Ankle Eversion           (Blank rows = not tested) * pain     LOWER EXTREMITY SPECIAL TESTS:  + ely's B L>R       GAIT: Distance walked: 25 ft Assistive device utilized: None Level of assistance: Complete Independence Comments: right foot reduced DF compared to left, wider base of support, limited hip motion R>L     TODAY'S TREATMENT:                                                                                                                              DATE:   08/10/2022    Therapeutic Exercise:    Aerobic: Supine: SLR with quad set 3x10 B, SAQs leg ER black bolster 3x10 B painfree ROM, bridges 4x5,  Prone:      Seated: hamstring stretch x3 30" holdsB,  LAQs 3x10    Standing: hamstring curl 3x10 B, DL heel raises 3x10 , calf stretch x3 30' holds B Neuromuscular Re-education: Manual Therapy: Therapeutic Activity: Self Care: Trigger Point Dry Needling:  Modalities:      PATIENT EDUCATION:  Education details: on HEP, on benefits of compression garments especially with flying. Person educated: Patient Education method: Explanation, Demonstration, and Handouts Education comprehension: verbalized understanding     HOME EXERCISE PROGRAM: [id number]   ASSESSMENT:   CLINICAL IMPRESSION: 08/10/2022 Reviewed and progressed HEP as tolerated. Improved tolerance to interventions as patient able to tolerate  SAQs and LAQs better. Discussed benefits of compression garments and why they are recommended with flying. Fatigue in legs noted but no pain noted end of session. Instructed patient to set time aside to perform exercises regularly.   Eval: Patient presents to physical therapy with chronic right knee pain that has been going on for over 10 years.  Patient limits movements and motions on the right leg secondary to  history of pain.  Patient recently returned to contra dancing which focuses on unilateral movements and patient reported increased pain afterwards.  Patient demonstrates reduced strength and range of motion in right lower extremity and would greatly benefit from skilled physical therapy at this time to improve overall function and quality of life.   OBJECTIVE IMPAIRMENTS: decreased activity tolerance, decreased balance, difficulty walking, decreased ROM, decreased strength, postural dysfunction, and pain.    ACTIVITY LIMITATIONS: lifting, transfers, and locomotion level   PARTICIPATION LIMITATIONS: community activity and dance class   PERSONAL FACTORS: Age, Education, Fitness, Past/current experiences, Time since onset of injury/illness/exacerbation, and 1 comorbidity: chronic right hip and knee pain  are also affecting patient's functional outcome.    REHAB POTENTIAL: Good   CLINICAL DECISION MAKING: Stable/uncomplicated   EVALUATION COMPLEXITY: Low     GOALS: Goals reviewed with patient? yes   SHORT TERM GOALS: Target date: 08/31/2022  Patient will be independent in self management strategies to improve quality of life and functional outcomes. Baseline: New Program Goal status: INITIAL   2.  Patient will report at least 50% improvement in overall symptoms and/or function to demonstrate improved functional mobility Baseline: 0% better Goal status: INITIAL   3.  Patient will be able to demonstrate pain-free knee range of motion Baseline: Painful on right Goal status: INITIAL         LONG TERM GOALS: Target date: 09/28/2022    Patient will report at least 75% improvement in overall symptoms and/or function to demonstrate improved functional mobility Baseline: 0% better Goal status: INITIAL   2.  Patient will be able to contraband without pain in right lower extremity Baseline: Painful Goal status: INITIAL   3.  Patient will be able to a send and descend 4 inch steps with  reciprocal gait pattern and use of handrail as needed without pain to demonstrate improved function Baseline: 1 at a time Goal status: INITIAL       PLAN:   PT FREQUENCY: 1-2x/week for total of 12 visits   PT DURATION: 8 weeks   PLANNED INTERVENTIONS: Therapeutic exercises, Therapeutic activity, Neuromuscular re-education, Balance training, Gait training, Patient/Family education, Self Care, Joint mobilization, Joint manipulation, Vestibular training, Canalith repositioning, Orthotic/Fit training, DME instructions, Aquatic Therapy, Dry Needling, Electrical stimulation, Spinal manipulation, Spinal mobilization, Cryotherapy, Moist heat, Traction, Ultrasound, Ionotophoresis '4mg'$ /ml Dexamethasone, Manual therapy, and Re-evaluation   PLAN FOR NEXT SESSION: quad strengthening, LE strengthening, stair training, strategies to reduce stress on right leg while in [place]     12:56 PM, 08/10/22 Jerene Pitch, DPT Physical Therapy with [facility] Sinner

## 2022-08-14 ENCOUNTER — Ambulatory Visit (INDEPENDENT_AMBULATORY_CARE_PROVIDER_SITE_OTHER): Payer: Medicare HMO | Admitting: Physical Therapy

## 2022-08-14 ENCOUNTER — Encounter: Payer: Self-pay | Admitting: Physical Therapy

## 2022-08-14 DIAGNOSIS — M25561 Pain in right knee: Secondary | ICD-10-CM | POA: Diagnosis not present

## 2022-08-14 DIAGNOSIS — G8929 Other chronic pain: Secondary | ICD-10-CM

## 2022-08-14 DIAGNOSIS — M6281 Muscle weakness (generalized): Secondary | ICD-10-CM

## 2022-08-14 NOTE — Therapy (Signed)
OUTPATIENT PHYSICAL THERAPY TREATMENT NOTE   Patient Name: Tina Sutton MRN: [id number] DOB:[birth year], 72 y.o., female Today's Date: 08/14/2022  PCP: [name] Crook, MD   REFERRING PROVIDER: Gregor Hams, MD  END OF SESSION:   PT End of Session - 08/14/22 1058     Visit Number 4    Number of Visits 12    Date for PT Re-Evaluation 09/28/22    PT Start Time 1104    PT Stop Time 1142    PT Time Calculation (min) 38 min    Behavior During Therapy WFL for tasks assessed/performed             Past Medical History:  Diagnosis Date   Depression    Dysmenorrhea    Fibroid    Hyperlipemia    Hyperlipidemia    Hypertension    Inverted nipple x several yrs   bilateral, pt states nipples have always been inverted   Joint pain    Obesity    Sleep apnea    SOB (shortness of breath)    Vitamin D deficiency    Past Surgical History:  Procedure Laterality Date   BLEPHAROPLASTY Bilateral 02/21/2008   BREAST BIOPSY Right    BREAST EXCISIONAL BIOPSY Right    benign   PELVIC LAPAROSCOPY     TOTAL ABDOMINAL HYSTERECTOMY  05/04/2001   [place]so.  fibroids   Patient Active Problem List   Diagnosis Date Noted   BMI 33.0-33.9,adult 07/05/2022   Obesity, Beginning BMI 38.79 07/05/2022   Diabetes mellitus ([place]) 04/04/2022   Hypertensive retinopathy 08/31/2021   Chronic right hip pain 11/15/2020   Allergic rhinitis 08/30/2020   Chronic kidney disease due to hypertension 08/30/2020   Type 2 diabetes mellitus with other specified complication ([place]) 99991111   History of malignant neoplasm of skin 08/30/2020   Hyperlipidemia 08/30/2020   Malignant melanoma of face ([place]) 08/30/2020   Class 2 severe obesity with serious comorbidity and body mass index (BMI) of 38.0 to 38.9 in adult [facility]) 08/30/2020   Osteopenia 08/30/2020   Personal history of colonic polyps 08/30/2020   Vitamin D deficiency 08/30/2020   Essential hypertension 03/03/2020    THERAPY DIAG:  Muscle weakness  (generalized)  Chronic pain of right knee    REFERRING DIAG: M25.561,G89.29 (ICD-10-CM) - Chronic pain of right knee    Rationale for Evaluation and Treatment: Rehabilitation   ONSET DATE: >10 years   SUBJECTIVE:    SUBJECTIVE STATEMENT: 08/14/2022 States she did better with her exercises and wants to prioritize her exercises. States she has been having soreness in her knees  Eval: States she has had right knee pain for over a decade. States that recently she was contra dancing and had right knee pain on the front of the knee. States for the most part it doesn't bother her. States that sometimes she  is more aware of it. States that stairs bother her. States she wants to stay mobile and to prevent injury     PERTINENT HISTORY: Hx right knee pain and right hip pain., right torn labrum  PAIN:  Are you having pain? no: NPRS scale: 0 /10 Pain location: anterior right knee Pain description: sharp, Aggravating factors: stairs, dancing Relieving factors: rest   PRECAUTIONS: None   WEIGHT BEARING RESTRICTIONS: No   FALLS:  Has patient fallen in last 6 months? No     OCCUPATION: not working   PLOF: Independent   PATIENT GOALS: to be able to walk around [place] and to  be able to continue to contr-dance.      OBJECTIVE:    DIAGNOSTIC FINDINGS: right knee xray - no report yet       COGNITION: Overall cognitive status: Within functional limits for tasks assessed                         SENSATION: WFL   EDEMA:  B swelling but equal       POSTURE: anterior pelvic tilt and flexed trunk    PALPATION: no significant tenderness noted in either leg       LE Measurements       Lower Extremity Right 08/03/2022 Left 08/03/2022    A/PROM MMT A/PROM MMT  Hip Flexion          Hip Extension          Hip Abduction          Hip Adduction          Hip Internal rotation          Hip External rotation          Knee Flexion WFL 3+ WFL 4  Knee Extension WFL* 3+ WFL 4  Ankle  Dorsiflexion          Ankle Plantarflexion          Ankle Inversion          Ankle Eversion           (Blank rows = not tested) * pain     LOWER EXTREMITY SPECIAL TESTS:  + ely's B L>R       GAIT: Distance walked: 25 ft Assistive device utilized: None Level of assistance: Complete Independence Comments: right foot reduced DF compared to left, wider base of support, limited hip motion R>L     TODAY'S TREATMENT:                                                                                                                              DATE:   08/14/2022    Therapeutic Exercise:    Aerobic: Supine:      Seated: STS - 15 minutes tried different heights with cues - red band, hip abd band red 3x10, LAQs - painful stopped    Standing: hamstring curl 3x10 B,  Neuromuscular Re-education: Manual Therapy: Therapeutic Activity: Self Care: Trigger Point Dry Needling:  Modalities:      PATIENT EDUCATION:  Education details: on HEP, on different exercises and when to do what exercises.  Person educated: Patient Education method: Explanation, Demonstration, and Handouts Education comprehension: verbalized understanding     HOME EXERCISE PROGRAM: [id number]   ASSESSMENT:   CLINICAL IMPRESSION: 08/14/2022 Reviewed exercises and answered all questions, discussed pairs of exercises and which ones to do daily and when to do certain ones. Added sit to stand which was initially painful but painfree with cues to press down with her feet and out with her  knees as her knees collapse into valgus. Overall patient doing well and would continue to benefit from skilled PT.  Eval: Patient presents to physical therapy with chronic right knee pain that has been going on for over 10 years.  Patient limits movements and motions on the right leg secondary to history of pain.  Patient recently returned to contra dancing which focuses on unilateral movements and patient reported increased pain afterwards.   Patient demonstrates reduced strength and range of motion in right lower extremity and would greatly benefit from skilled physical therapy at this time to improve overall function and quality of life.   OBJECTIVE IMPAIRMENTS: decreased activity tolerance, decreased balance, difficulty walking, decreased ROM, decreased strength, postural dysfunction, and pain.    ACTIVITY LIMITATIONS: lifting, transfers, and locomotion level   PARTICIPATION LIMITATIONS: community activity and dance class   PERSONAL FACTORS: Age, Education, Fitness, Past/current experiences, Time since onset of injury/illness/exacerbation, and 1 comorbidity: chronic right hip and knee pain  are also affecting patient's functional outcome.    REHAB POTENTIAL: Good   CLINICAL DECISION MAKING: Stable/uncomplicated   EVALUATION COMPLEXITY: Low     GOALS: Goals reviewed with patient? yes   SHORT TERM GOALS: Target date: 08/31/2022  Patient will be independent in self management strategies to improve quality of life and functional outcomes. Baseline: New Program Goal status: INITIAL   2.  Patient will report at least 50% improvement in overall symptoms and/or function to demonstrate improved functional mobility Baseline: 0% better Goal status: INITIAL   3.  Patient will be able to demonstrate pain-free knee range of motion Baseline: Painful on right Goal status: INITIAL         LONG TERM GOALS: Target date: 09/28/2022    Patient will report at least 75% improvement in overall symptoms and/or function to demonstrate improved functional mobility Baseline: 0% better Goal status: INITIAL   2.  Patient will be able to contraband without pain in right lower extremity Baseline: Painful Goal status: INITIAL   3.  Patient will be able to a send and descend 4 inch steps with reciprocal gait pattern and use of handrail as needed without pain to demonstrate improved function Baseline: 1 at a time Goal status: INITIAL        PLAN:   PT FREQUENCY: 1-2x/week for total of 12 visits   PT DURATION: 8 weeks   PLANNED INTERVENTIONS: Therapeutic exercises, Therapeutic activity, Neuromuscular re-education, Balance training, Gait training, Patient/Family education, Self Care, Joint mobilization, Joint manipulation, Vestibular training, Canalith repositioning, Orthotic/Fit training, DME instructions, Aquatic Therapy, Dry Needling, Electrical stimulation, Spinal manipulation, Spinal mobilization, Cryotherapy, Moist heat, Traction, Ultrasound, Ionotophoresis '4mg'$ /ml Dexamethasone, Manual therapy, and Re-evaluation   PLAN FOR NEXT SESSION: quad strengthening, LE strengthening, stair training, strategies to reduce stress on right leg while in [place]     11:46 AM, 08/14/22 Jerene Pitch, DPT Physical Therapy with [facility] Sinner

## 2022-08-17 ENCOUNTER — Encounter: Payer: Self-pay | Admitting: Physical Therapy

## 2022-08-17 ENCOUNTER — Ambulatory Visit (INDEPENDENT_AMBULATORY_CARE_PROVIDER_SITE_OTHER): Payer: Medicare HMO | Admitting: Physical Therapy

## 2022-08-17 DIAGNOSIS — G8929 Other chronic pain: Secondary | ICD-10-CM

## 2022-08-17 DIAGNOSIS — M6281 Muscle weakness (generalized): Secondary | ICD-10-CM

## 2022-08-17 DIAGNOSIS — M25561 Pain in right knee: Secondary | ICD-10-CM

## 2022-08-17 NOTE — Therapy (Signed)
OUTPATIENT PHYSICAL THERAPY TREATMENT NOTE   Patient Name: Tina Sutton MRN: [id number] DOB:Jun 19, 1950, 72 y.o., female Today's Date: 08/17/2022  PCP: [name] Crook, MD   REFERRING PROVIDER: Gregor Hams, MD  END OF SESSION:   PT End of Session - 08/17/22 1216     Visit Number 5    Number of Visits 12    Date for PT Re-Evaluation 09/28/22    PT Start Time 1216    PT Stop Time 1255    PT Time Calculation (min) 39 min    Behavior During Therapy WFL for tasks assessed/performed             Past Medical History:  Diagnosis Date   Depression    Dysmenorrhea    Fibroid    Hyperlipemia    Hyperlipidemia    Hypertension    Inverted nipple x several yrs   bilateral, pt states nipples have always been inverted   Joint pain    Obesity    Sleep apnea    SOB (shortness of breath)    Vitamin D deficiency    Past Surgical History:  Procedure Laterality Date   BLEPHAROPLASTY Bilateral 02/21/2008   BREAST BIOPSY Right    BREAST EXCISIONAL BIOPSY Right    benign   PELVIC LAPAROSCOPY     TOTAL ABDOMINAL HYSTERECTOMY  05/04/2001   [place].  fibroids   Patient Active Problem List   Diagnosis Date Noted   BMI 33.0-33.9,adult 07/05/2022   Obesity, Beginning BMI 38.79 07/05/2022   Diabetes mellitus ([place]) 04/04/2022   Hypertensive retinopathy 08/31/2021   Chronic right hip pain 11/15/2020   Allergic rhinitis 08/30/2020   Chronic kidney disease due to hypertension 08/30/2020   Type 2 diabetes mellitus with other specified complication ([place]) 99991111   History of malignant neoplasm of skin 08/30/2020   Hyperlipidemia 08/30/2020   Malignant melanoma of face ([place]) 08/30/2020   Class 2 severe obesity with serious comorbidity and body mass index (BMI) of 38.0 to 38.9 in adult [facility]) 08/30/2020   Osteopenia 08/30/2020   Personal history of colonic polyps 08/30/2020   Vitamin D deficiency 08/30/2020   Essential hypertension 03/03/2020    THERAPY DIAG:  Muscle weakness  (generalized)  Chronic pain of right knee    REFERRING DIAG: M25.561,G89.29 (ICD-10-CM) - Chronic pain of right knee    Rationale for Evaluation and Treatment: Rehabilitation   ONSET DATE: >10 years   SUBJECTIVE:    SUBJECTIVE STATEMENT: 08/17/2022 States she brought in her exercises to make sure she is doing them correctly.   Eval: States she has had right knee pain for over a decade. States that recently she was contra dancing and had right knee pain on the front of the knee. States for the most part it doesn't bother her. States that sometimes she  is more aware of it. States that stairs bother her. States she wants to stay mobile and to prevent injury     PERTINENT HISTORY: Hx right knee pain and right hip pain., right torn labrum  PAIN:  Are you having pain? no: NPRS scale: 0 /10 Pain location: anterior right knee Pain description: sharp, Aggravating factors: stairs, dancing Relieving factors: rest   PRECAUTIONS: None   WEIGHT BEARING RESTRICTIONS: No   FALLS:  Has patient fallen in last 6 months? No     OCCUPATION: not working   PLOF: Independent   PATIENT GOALS: to be able to walk around [place] and to be able to continue to contr-dance.  OBJECTIVE:    DIAGNOSTIC FINDINGS: right knee xray - no report yet       COGNITION: Overall cognitive status: Within functional limits for tasks assessed                         SENSATION: WFL   EDEMA:  B swelling but equal       POSTURE: anterior pelvic tilt and flexed trunk    PALPATION: no significant tenderness noted in either leg       LE Measurements       Lower Extremity Right 08/03/2022 Left 08/03/2022    A/PROM MMT A/PROM MMT  Hip Flexion          Hip Extension          Hip Abduction          Hip Adduction          Hip Internal rotation          Hip External rotation          Knee Flexion WFL 3+ WFL 4  Knee Extension WFL* 3+ WFL 4  Ankle Dorsiflexion          Ankle Plantarflexion           Ankle Inversion          Ankle Eversion           (Blank rows = not tested) * pain     LOWER EXTREMITY SPECIAL TESTS:  + ely's B L>R       GAIT: Distance walked: 25 ft Assistive device utilized: None Level of assistance: Complete Independence Comments: right foot reduced DF compared to left, wider base of support, limited hip motion R>L     TODAY'S TREATMENT:                                                                                                                              DATE:   08/17/2022    Therapeutic Exercise:    Aerobic: Supine:      Seated: STS - 12 minutes  with cues - red band, hip abd band red reviewed, LAQs 3x10 B    Standing:  Neuromuscular Re-education:Standing: SLS x3 30" B, tandem x3 30" holds B, tandem with head turns 3x5 B, tandem on towel x1 30" holds B, tandem on towel with 1x5 head turns B Manual Therapy: Therapeutic Activity: Self Care: Trigger Point Dry Needling:  Modalities:      PATIENT EDUCATION:  Education details: on HEP, on rationale for balance exercises and importance of ankle stability in regards to knee pain. Person educated: Patient Education method: Explanation, Demonstration, and Handouts Education comprehension: verbalized understanding     HOME EXERCISE PROGRAM:    ASSESSMENT:   CLINICAL IMPRESSION: 08/17/2022 Reviewed HEP and answered all questions. Focused on ankle stability exercises secondary to poor ankle stability with standing exercises. Left ankle had greater challenges with balance compared  to right and this was also the knee that bothered patient with standing exercises. No pain noted end of session but fatigue. Will continue with current POC as tolerated.   Eval: Patient presents to physical therapy with chronic right knee pain that has been going on for over 10 years.  Patient limits movements and motions on the right leg secondary to history of pain.  Patient recently returned to contra dancing which  focuses on unilateral movements and patient reported increased pain afterwards.  Patient demonstrates reduced strength and range of motion in right lower extremity and would greatly benefit from skilled physical therapy at this time to improve overall function and quality of life.   OBJECTIVE IMPAIRMENTS: decreased activity tolerance, decreased balance, difficulty walking, decreased ROM, decreased strength, postural dysfunction, and pain.    ACTIVITY LIMITATIONS: lifting, transfers, and locomotion level   PARTICIPATION LIMITATIONS: community activity and dance class   PERSONAL FACTORS: Age, Education, Fitness, Past/current experiences, Time since onset of injury/illness/exacerbation, and 1 comorbidity: chronic right hip and knee pain  are also affecting patient's functional outcome.    REHAB POTENTIAL: Good   CLINICAL DECISION MAKING: Stable/uncomplicated   EVALUATION COMPLEXITY: Low     GOALS: Goals reviewed with patient? yes   SHORT TERM GOALS: Target date: 08/31/2022  Patient will be independent in self management strategies to improve quality of life and functional outcomes. Baseline: New Program Goal status: INITIAL   2.  Patient will report at least 50% improvement in overall symptoms and/or function to demonstrate improved functional mobility Baseline: 0% better Goal status: INITIAL   3.  Patient will be able to demonstrate pain-free knee range of motion Baseline: Painful on right Goal status: INITIAL         LONG TERM GOALS: Target date: 09/28/2022    Patient will report at least 75% improvement in overall symptoms and/or function to demonstrate improved functional mobility Baseline: 0% better Goal status: INITIAL   2.  Patient will be able to contraband without pain in right lower extremity Baseline: Painful Goal status: INITIAL   3.  Patient will be able to a send and descend 4 inch steps with reciprocal gait pattern and use of handrail as needed without pain to  demonstrate improved function Baseline: 1 at a time Goal status: INITIAL       PLAN:   PT FREQUENCY: 1-2x/week for total of 12 visits   PT DURATION: 8 weeks   PLANNED INTERVENTIONS: Therapeutic exercises, Therapeutic activity, Neuromuscular re-education, Balance training, Gait training, Patient/Family education, Self Care, Joint mobilization, Joint manipulation, Vestibular training, Canalith repositioning, Orthotic/Fit training, DME instructions, Aquatic Therapy, Dry Needling, Electrical stimulation, Spinal manipulation, Spinal mobilization, Cryotherapy, Moist heat, Traction, Ultrasound, Ionotophoresis '4mg'$ /ml Dexamethasone, Manual therapy, and Re-evaluation   PLAN FOR NEXT SESSION: quad strengthening, LE strengthening, stair training, strategies to reduce stress on right leg while in [place]     12:56 PM, 08/17/22 Jerene Pitch, DPT Physical Therapy with Royston Sinner

## 2022-08-21 ENCOUNTER — Ambulatory Visit: Payer: Medicare HMO | Admitting: Family Medicine

## 2022-08-24 ENCOUNTER — Encounter: Payer: Self-pay | Admitting: Family Medicine

## 2022-08-24 ENCOUNTER — Encounter: Payer: Self-pay | Admitting: Physical Therapy

## 2022-08-24 ENCOUNTER — Ambulatory Visit: Payer: Medicare HMO | Admitting: Family Medicine

## 2022-08-24 ENCOUNTER — Ambulatory Visit: Payer: Medicare HMO | Admitting: Physical Therapy

## 2022-08-24 ENCOUNTER — Other Ambulatory Visit: Payer: Self-pay

## 2022-08-24 VITALS — BP 136/82 | HR 71 | Ht 64.0 in | Wt 194.8 lb

## 2022-08-24 DIAGNOSIS — M25561 Pain in right knee: Secondary | ICD-10-CM

## 2022-08-24 DIAGNOSIS — G8929 Other chronic pain: Secondary | ICD-10-CM

## 2022-08-24 DIAGNOSIS — M6281 Muscle weakness (generalized): Secondary | ICD-10-CM

## 2022-08-24 NOTE — Therapy (Signed)
OUTPATIENT PHYSICAL THERAPY TREATMENT NOTE   Patient Name: Tina Sutton MRN: [id number] DOB:09-12-1950, 72 y.o., female Today's Date: 08/24/2022  PCP: [name] Crook, MD   REFERRING PROVIDER: Gregor Hams, MD  END OF SESSION:   PT End of Session - 08/24/22 0928     Visit Number 6    Number of Visits 12    Date for PT Re-Evaluation 09/28/22    PT Start Time 0930    PT Stop Time 1010    PT Time Calculation (min) 40 min    Behavior During Therapy [facility] for tasks assessed/performed             Past Medical History:  Diagnosis Date   Depression    Dysmenorrhea    Fibroid    Hyperlipemia    Hyperlipidemia    Hypertension    Inverted nipple x several yrs   bilateral, pt states nipples have always been inverted   Joint pain    Obesity    Sleep apnea    SOB (shortness of breath)    Vitamin D deficiency    Past Surgical History:  Procedure Laterality Date   BLEPHAROPLASTY Bilateral 02/21/2008   BREAST BIOPSY Right    BREAST EXCISIONAL BIOPSY Right    benign   PELVIC LAPAROSCOPY     TOTAL ABDOMINAL HYSTERECTOMY  05/04/2001   [place]  fibroids   Patient Active Problem List   Diagnosis Date Noted   BMI 33.0-33.9,adult 07/05/2022   Obesity, Beginning BMI 38.79 07/05/2022   Diabetes mellitus ([place]) 04/04/2022   Hypertensive retinopathy 08/31/2021   Chronic right hip pain 11/15/2020   Allergic rhinitis 08/30/2020   Chronic kidney disease due to hypertension 08/30/2020   Type 2 diabetes mellitus with other specified complication ([place]) 99991111   History of malignant neoplasm of skin 08/30/2020   Hyperlipidemia 08/30/2020   Malignant melanoma of face ([place]) 08/30/2020   Class 2 severe obesity with serious comorbidity and body mass index (BMI) of 38.0 to 38.9 in adult [facility]) 08/30/2020   Osteopenia 08/30/2020   Personal history of colonic polyps 08/30/2020   Vitamin D deficiency 08/30/2020   Essential hypertension 03/03/2020    THERAPY DIAG:  Muscle  weakness (generalized)  Chronic pain of right knee    REFERRING DIAG: M25.561,G89.29 (ICD-10-CM) - Chronic pain of right knee    Rationale for Evaluation and Treatment: Rehabilitation   ONSET DATE: >10 years   SUBJECTIVE:    SUBJECTIVE STATEMENT: 08/24/2022 States she did a lot of stairs and they were really steep. States she did alternating feet. States occasionally she was doing one foot at a time. States she wants to make sure is doing them correctly. States she is getting her cortisone injection after session. States she was using there railings for the stairs.  Eval: States she has had right knee pain for over a decade. States that recently she was contra dancing and had right knee pain on the front of the knee. States for the most part it doesn't bother her. States that sometimes she  is more aware of it. States that stairs bother her. States she wants to stay mobile and to prevent injury     PERTINENT HISTORY: Hx right knee pain and right hip pain., right torn labrum  PAIN:  Are you having pain? no: NPRS scale: 1 /10 Pain location: anterior right knee Pain description: dull ach, Aggravating factors: stairs, dancing Relieving factors: rest   PRECAUTIONS: None   WEIGHT BEARING RESTRICTIONS: No  FALLS:  Has patient fallen in last 6 months? No     OCCUPATION: not working   PLOF: Independent   PATIENT GOALS: to be able to walk around [place] and to be able to continue to contr-dance.      OBJECTIVE:    DIAGNOSTIC FINDINGS: right knee xray - no report yet       COGNITION: Overall cognitive status: Within functional limits for tasks assessed                         SENSATION: WFL   EDEMA:  B swelling but equal       POSTURE: anterior pelvic tilt and flexed trunk    PALPATION: no significant tenderness noted in either leg       LE Measurements       Lower Extremity Right 08/03/2022 Left 08/03/2022    A/PROM MMT A/PROM MMT  Hip Flexion           Hip Extension          Hip Abduction          Hip Adduction          Hip Internal rotation          Hip External rotation          Knee Flexion WFL 3+ WFL 4  Knee Extension WFL* 3+ WFL 4  Ankle Dorsiflexion          Ankle Plantarflexion          Ankle Inversion          Ankle Eversion           (Blank rows = not tested) * pain     LOWER EXTREMITY SPECIAL TESTS:  + ely's B L>R       GAIT: Distance walked: 25 ft Assistive device utilized: None Level of assistance: Complete Independence Comments: right foot reduced DF compared to left, wider base of support, limited hip motion R>L     TODAY'S TREATMENT:                                                                                                                              DATE:   08/24/2022 Therapeutic Exercise:    Aerobic: Supine:  bent knee fall out with focus on muscle activation 5 minutes    Seated: STS - 12 minutes  with cues - red band, hip abd band red reviewed, LAQs 3x10 B    Standing: stairs- with visual cues and verbal cues -15 minutes focus on form, DF at wall 2 minutes B, 2 minutes DL heel raises, toe walk at counter x3 1 minute bouts UE assist  Neuromuscular Re-education: Manual Therapy: Therapeutic Activity: Self Care: Trigger Point Dry Needling:  Modalities:      PATIENT EDUCATION:  Education details: on HEP,on Nutritional therapist  Person educated: Patient Education method: Explanation, Demonstration, and Handouts Education comprehension: verbalized understanding  HOME EXERCISE PROGRAM:    ASSESSMENT:   CLINICAL IMPRESSION: 08/24/2022 Session started with review of stair mechanics per patient request. Patient with continued valgus collapse B with R>L. Educated patient on cause of this and exercises that will help with this and her pain. Added hip motion but performed slowly as patient hesitant due to old labrum tear on right. Tolerated bent knee fall outs with no pain. Patient will continue to  benefit from skilled PT at this time.   Eval: Patient presents to physical therapy with chronic right knee pain that has been going on for over 10 years.  Patient limits movements and motions on the right leg secondary to history of pain.  Patient recently returned to contra dancing which focuses on unilateral movements and patient reported increased pain afterwards.  Patient demonstrates reduced strength and range of motion in right lower extremity and would greatly benefit from skilled physical therapy at this time to improve overall function and quality of life.   OBJECTIVE IMPAIRMENTS: decreased activity tolerance, decreased balance, difficulty walking, decreased ROM, decreased strength, postural dysfunction, and pain.    ACTIVITY LIMITATIONS: lifting, transfers, and locomotion level   PARTICIPATION LIMITATIONS: community activity and dance class   PERSONAL FACTORS: Age, Education, Fitness, Past/current experiences, Time since onset of injury/illness/exacerbation, and 1 comorbidity: chronic right hip and knee pain  are also affecting patient's functional outcome.    REHAB POTENTIAL: Good   CLINICAL DECISION MAKING: Stable/uncomplicated   EVALUATION COMPLEXITY: Low     GOALS: Goals reviewed with patient? yes   SHORT TERM GOALS: Target date: 08/31/2022  Patient will be independent in self management strategies to improve quality of life and functional outcomes. Baseline: New Program Goal status: INITIAL   2.  Patient will report at least 50% improvement in overall symptoms and/or function to demonstrate improved functional mobility Baseline: 0% better Goal status: INITIAL   3.  Patient will be able to demonstrate pain-free knee range of motion Baseline: Painful on right Goal status: INITIAL         LONG TERM GOALS: Target date: 09/28/2022    Patient will report at least 75% improvement in overall symptoms and/or function to demonstrate improved functional mobility Baseline:  0% better Goal status: INITIAL   2.  Patient will be able to contraband without pain in right lower extremity Baseline: Painful Goal status: INITIAL   3.  Patient will be able to a send and descend 4 inch steps with reciprocal gait pattern and use of handrail as needed without pain to demonstrate improved function Baseline: 1 at a time Goal status: INITIAL       PLAN:   PT FREQUENCY: 1-2x/week for total of 12 visits   PT DURATION: 8 weeks   PLANNED INTERVENTIONS: Therapeutic exercises, Therapeutic activity, Neuromuscular re-education, Balance training, Gait training, Patient/Family education, Self Care, Joint mobilization, Joint manipulation, Vestibular training, Canalith repositioning, Orthotic/Fit training, DME instructions, Aquatic Therapy, Dry Needling, Electrical stimulation, Spinal manipulation, Spinal mobilization, Cryotherapy, Moist heat, Traction, Ultrasound, Ionotophoresis '4mg'$ /ml Dexamethasone, Manual therapy, and Re-evaluation   PLAN FOR NEXT SESSION: progress hip ROM R Gradually per patient tolerance (hesitant) quad strengthening, LE strengthening, stair training, strategies to reduce stress on right leg while in [place]     10:14 AM, 08/24/22 Jerene Pitch, DPT Physical Therapy with Royston Sinner

## 2022-08-24 NOTE — Patient Instructions (Addendum)
You received an injection today. Seek immediate medical attention if the joint becomes red, extremely painful, or is oozing fluid.  Recheck as needed.   Enjoy your trip.

## 2022-08-24 NOTE — Progress Notes (Signed)
I, Josepha Pigg, CMA acting as a scribe for [name], MD.  Tina Sutton is a 72 y.o. female who presents to [place] [facility] today for f/u R knee pain. She has an upcoming trip to [place]. Pt was last seen by Dr. Georgina Snell on 08/02/22 and was advised to use Tylenol, Voltaren gel, and was referred to PT, completing 5 visits. Pt was advised to return prior to her trip for steroid injection. Today, pt reports continued right knee pain. PT has been helpful for knee sx. Would like to get steroid injection before leaving for [place] next week. Denies swelling, mechanical sx.   Dx imaging: 08/23/20 R knee XR   Pertinent review of systems: No fevers or chills  Relevant historical information: Hypertension.  Diabetes.  History of melanoma.   Exam:  BP 136/82   Pulse 71   Ht '5\' 4"'$  (1.626 m)   Wt 194 lb 12.8 oz (88.4 kg)   LMP 06/12/2000   SpO2 98%   BMI 33.44 kg/m  General: Well Developed, well nourished, and in no acute distress.   MSK: Right knee: Normal-appearing Normal motion with crepitation Intact strength. Tender palpation medial joint line.    Lab and Radiology Results  Procedure: Real-time Ultrasound Guided Injection of right knee superior lateral patellar space Device: Philips Affiniti 50G Images permanently stored and available for review in PACS Verbal informed consent obtained.  Discussed risks and benefits of procedure. Warned about infection, bleeding, hyperglycemia damage to structures among others. Patient expresses understanding and agreement Time-out conducted.   Noted no overlying erythema, induration, or other signs of local infection.   Skin prepped in a sterile fashion.   Local anesthesia: Topical Ethyl chloride.   With sterile technique and under real time ultrasound guidance: 40 mg of Kenalog and 2 mL Marcaine injected into knee joint. Fluid seen entering the joint capsule.   Completed without difficulty   Pain immediately  resolved suggesting accurate placement of the medication.   Advised to call if fevers/chills, erythema, induration, drainage, or persistent bleeding.   Images permanently stored and available for review in the ultrasound unit.  Impression: Technically successful ultrasound guided injection.  EXAM: RIGHT KNEE 3 VIEWS   COMPARISON:  08/23/2020   FINDINGS: No fracture or dislocation.   Soft tissues are unremarkable.   Mild joint space loss in the medial and lateral compartments. Mild spurring and subchondral cystic changes in the patellofemoral compartment.   IMPRESSION: 1. No acute abnormality of the right knee. 2. Mild degenerative tricompartmental osteoarthrosis.     Electronically Signed   By: Miachel Roux M.D.   On: 08/04/2022 19:00   I, [name], personally (independently) visualized and performed the interpretation of the images attached in this note.      Assessment and Plan: 72 y.o. female with right knee pain thought to be due to DJD exacerbation.  Plan for steroid injection today.  Check back as needed.  Continue home exercise program and other conservative management strategies including Voltaren gel and acetaminophen. Avoid high-dose oral NSAIDs given hypertension and diabetes.  PDMP not reviewed this encounter. Orders Placed This Encounter  Procedures   [name] LIMITED JOINT SPACE STRUCTURES LOW RIGHT(NO LINKED CHARGES)    Order Specific Question:   Reason for Exam (SYMPTOM  OR DIAGNOSIS REQUIRED)    Answer:   right knee pain    Order Specific Question:   Preferred imaging location?    Answer:   [place]  No orders of the defined types were placed in this encounter.    Discussed warning signs or symptoms. Please see discharge instructions. Patient expresses understanding.   The above documentation has been reviewed and is accurate and complete [name], M.D.

## 2022-08-28 ENCOUNTER — Ambulatory Visit: Payer: Medicare HMO | Admitting: Physical Therapy

## 2022-08-28 ENCOUNTER — Encounter: Payer: Self-pay | Admitting: Physical Therapy

## 2022-08-28 ENCOUNTER — Ambulatory Visit: Payer: Medicare HMO | Admitting: Family Medicine

## 2022-08-28 DIAGNOSIS — M6281 Muscle weakness (generalized): Secondary | ICD-10-CM | POA: Diagnosis not present

## 2022-08-28 DIAGNOSIS — G8929 Other chronic pain: Secondary | ICD-10-CM | POA: Diagnosis not present

## 2022-08-28 DIAGNOSIS — M25561 Pain in right knee: Secondary | ICD-10-CM | POA: Diagnosis not present

## 2022-08-28 NOTE — Therapy (Addendum)
OUTPATIENT PHYSICAL THERAPY TREATMENT NOTE PHYSICAL THERAPY DISCHARGE SUMMARY  Visits from Start of Care: 7  Current functional level related to goals / functional outcomes: Unable to assess due to unplanned discharge    Remaining deficits: Unable to assess due to unplanned discharge    Education / Equipment: Unable to assess due to unplanned discharge    Patient agrees to discharge. Patient goals were partially met. Patient is being discharged due to not returning since the last visit.  12:32 PM, 12/05/22 Tereasa Coop, DPT Physical Therapy with [facility]   Patient Name: Tina Sutton MRN: [id number] DOB:[birth year], 72 y.o., female Today's Date: 08/28/2022  PCP: Jeani Sow, MD   REFERRING PROVIDER: Rodolph Bong, MD  END OF SESSION:   PT End of Session - 08/28/22 1431     Visit Number 7    Number of Visits 12    Date for PT Re-Evaluation 09/28/22    PT Start Time 1434    PT Stop Time 1508    PT Time Calculation (min) 34 min    Behavior During Therapy [facility] for tasks assessed/performed             Past Medical History:  Diagnosis Date   Depression    Dysmenorrhea    Fibroid    Hyperlipemia    Hyperlipidemia    Hypertension    Inverted nipple x several yrs   bilateral, pt states nipples have always been inverted   Joint pain    Obesity    Sleep apnea    SOB (shortness of breath)    Vitamin D deficiency    Past Surgical History:  Procedure Laterality Date   BLEPHAROPLASTY Bilateral 02/21/2008   BREAST BIOPSY Right    BREAST EXCISIONAL BIOPSY Right    benign   PELVIC LAPAROSCOPY     TOTAL ABDOMINAL HYSTERECTOMY  05/04/2001   [place] bso.  fibroids   Patient Active Problem List   Diagnosis Date Noted   BMI 33.0-33.9,adult 07/05/2022   Obesity, Beginning BMI 38.79 07/05/2022   Diabetes mellitus (HCC) 04/04/2022   Hypertensive retinopathy 08/31/2021   Chronic right hip pain 11/15/2020   Allergic rhinitis 08/30/2020   Chronic kidney  disease due to hypertension 08/30/2020   Type 2 diabetes mellitus with other specified complication (HCC) 08/30/2020   History of malignant neoplasm of skin 08/30/2020   Hyperlipidemia 08/30/2020   Malignant melanoma of face (HCC) 08/30/2020   Class 2 severe obesity with serious comorbidity and body mass index (BMI) of 38.0 to 38.9 in adult [facility]) 08/30/2020   Osteopenia 08/30/2020   Personal history of colonic polyps 08/30/2020   Vitamin D deficiency 08/30/2020   Essential hypertension 03/03/2020    THERAPY DIAG:  Muscle weakness (generalized)  Chronic pain of right knee    REFERRING DIAG: M25.561,G89.29 (ICD-10-CM) - Chronic pain of right knee    Rationale for Evaluation and Treatment: Rehabilitation   ONSET DATE: >10 years   SUBJECTIVE:    SUBJECTIVE STATEMENT: 08/28/2022 States for the most part she is feeling alright and has been trying to do her steps and spreading of her legs but has not been doing her exercises regularly but has not had time for the exercises. States she feels 60% better because she is doing stairs much better.   Eval: States she has had right knee pain for over a decade. States that recently she was contra dancing and had right knee pain on the front of the knee. States for the most part it doesn't  bother her. States that sometimes she  is more aware of it. States that stairs bother her. States she wants to stay mobile and to prevent injury     PERTINENT HISTORY: Hx right knee pain and right hip pain., right torn labrum  PAIN:  Are you having pain? no: NPRS scale: 1 /10 Pain location: anterior right knee Pain description: dull ache, Aggravating factors: stairs, dancing Relieving factors: rest   PRECAUTIONS: None   WEIGHT BEARING RESTRICTIONS: No   FALLS:  Has patient fallen in last 6 months? No     OCCUPATION: not working   PLOF: Independent   PATIENT GOALS: to be able to walk around [place] and to be able to continue to contr-dance.       OBJECTIVE:    DIAGNOSTIC FINDINGS: right knee xray - no report yet       COGNITION: Overall cognitive status: Within functional limits for tasks assessed                         SENSATION: WFL   EDEMA:  B swelling but equal       POSTURE: anterior pelvic tilt and flexed trunk    PALPATION: no significant tenderness noted in either leg       LE Measurements       Lower Extremity Right 08/28/22 Left 08/28/2022    A/PROM MMT A/PROM MMT  Hip Flexion          Hip Extension          Hip Abduction          Hip Adduction          Hip Internal rotation          Hip External rotation          Knee Flexion WFL 3+ WFL 4  Knee Extension WFL 3+ WFL 4  Ankle Dorsiflexion          Ankle Plantarflexion          Ankle Inversion          Ankle Eversion           (Blank rows = not tested) * pain     LOWER EXTREMITY SPECIAL TESTS:  + ely's B L>R          TODAY'S TREATMENT:                                                                                                                              DATE:   08/28/2022 Therapeutic Exercise:    Aerobic: Supine:   Prone: knee bends 2 x10 5" holds B Neuromuscular Re-education: Manual Therapy: Therapeutic Activity: Self Care: Trigger Point Dry Needling:  Modalities:      PATIENT EDUCATION:  Education details: on HEP,on stair mechanics, progress made Person educated: Patient Education method: Explanation, Demonstration, and Handouts Education comprehension: verbalized understanding     HOME EXERCISE PROGRAM:  ASSESSMENT:   CLINICAL IMPRESSION: 08/28/2022 Patient has met 2/3 short term goals and 1/3 long term goals at this time. Reviewed HEP and answered all questions about upcoming trip to [place]. Overall patient is doing well and would continue to benefit from sklled PT to work towards remaining goals.  Eval: Patient presents to physical therapy with chronic right knee pain that has been going on for over 10  years.  Patient limits movements and motions on the right leg secondary to history of pain.  Patient recently returned to contra dancing which focuses on unilateral movements and patient reported increased pain afterwards.  Patient demonstrates reduced strength and range of motion in right lower extremity and would greatly benefit from skilled physical therapy at this time to improve overall function and quality of life.   OBJECTIVE IMPAIRMENTS: decreased activity tolerance, decreased balance, difficulty walking, decreased ROM, decreased strength, postural dysfunction, and pain.    ACTIVITY LIMITATIONS: lifting, transfers, and locomotion level   PARTICIPATION LIMITATIONS: community activity and dance class   PERSONAL FACTORS: Age, Education, Fitness, Past/current experiences, Time since onset of injury/illness/exacerbation, and 1 comorbidity: chronic right hip and knee pain  are also affecting patient's functional outcome.    REHAB POTENTIAL: Good   CLINICAL DECISION MAKING: Stable/uncomplicated   EVALUATION COMPLEXITY: Low     GOALS: Goals reviewed with patient? yes   SHORT TERM GOALS: Target date: 08/31/2022  Patient will be independent in self management strategies to improve quality of life and functional outcomes. Baseline: New Program Goal status: PROGRESSING    2.  Patient will report at least 50% improvement in overall symptoms and/or function to demonstrate improved functional mobility Baseline: 0% better Goal status: MET    3.  Patient will be able to demonstrate pain-free knee range of motion Baseline: Painful on right Goal status: MET         LONG TERM GOALS: Target date: 09/28/2022    Patient will report at least 75% improvement in overall symptoms and/or function to demonstrate improved functional mobility Baseline: 0% better Goal status: PROGRESSING    2.  Patient will be able to contra dancing  without pain in right lower extremity Baseline: Painful Goal  status: PROGRESSING    3.  Patient will be able to a send and descend 4 inch steps with reciprocal gait pattern and use of handrail as needed without pain to demonstrate improved function Baseline: 1 at a time Goal status: MET - able to perform on 6 inch  step       PLAN:   PT FREQUENCY: 1-2x/week for total of 12 visits   PT DURATION: 8 weeks   PLANNED INTERVENTIONS: Therapeutic exercises, Therapeutic activity, Neuromuscular re-education, Balance training, Gait training, Patient/Family education, Self Care, Joint mobilization, Joint manipulation, Vestibular training, Canalith repositioning, Orthotic/Fit training, DME instructions, Aquatic Therapy, Dry Needling, Electrical stimulation, Spinal manipulation, Spinal mobilization, Cryotherapy, Moist heat, Traction, Ultrasound, Ionotophoresis 4mg /ml Dexamethasone, Manual therapy, and Re-evaluation   PLAN FOR NEXT SESSION: progress hip ROM R Gradually per patient tolerance (hesitant) quad strengthening, LE strengthening, stair training, strategies to reduce stress on right leg while in [place]     3:12 PM, 08/28/22 Tereasa Coop, DPT Physical Therapy with [facility]

## 2022-08-31 ENCOUNTER — Encounter: Payer: Medicare HMO | Admitting: Physical Therapy

## 2022-09-13 ENCOUNTER — Encounter: Payer: Medicare HMO | Admitting: Physical Therapy

## 2022-09-20 DIAGNOSIS — Z961 Presence of intraocular lens: Secondary | ICD-10-CM | POA: Diagnosis not present

## 2022-09-20 DIAGNOSIS — R7303 Prediabetes: Secondary | ICD-10-CM | POA: Diagnosis not present

## 2022-09-20 DIAGNOSIS — H43813 Vitreous degeneration, bilateral: Secondary | ICD-10-CM | POA: Diagnosis not present

## 2022-09-20 DIAGNOSIS — H35033 Hypertensive retinopathy, bilateral: Secondary | ICD-10-CM | POA: Diagnosis not present

## 2022-10-04 ENCOUNTER — Encounter (INDEPENDENT_AMBULATORY_CARE_PROVIDER_SITE_OTHER): Payer: Self-pay | Admitting: Family Medicine

## 2022-10-04 ENCOUNTER — Ambulatory Visit (INDEPENDENT_AMBULATORY_CARE_PROVIDER_SITE_OTHER): Payer: Medicare HMO | Admitting: Family Medicine

## 2022-10-04 VITALS — BP 103/69 | HR 91 | Temp 98.3°F | Ht 64.0 in | Wt 191.0 lb

## 2022-10-04 DIAGNOSIS — E559 Vitamin D deficiency, unspecified: Secondary | ICD-10-CM

## 2022-10-04 DIAGNOSIS — E669 Obesity, unspecified: Secondary | ICD-10-CM

## 2022-10-04 DIAGNOSIS — R252 Cramp and spasm: Secondary | ICD-10-CM | POA: Diagnosis not present

## 2022-10-04 DIAGNOSIS — Z6832 Body mass index (BMI) 32.0-32.9, adult: Secondary | ICD-10-CM | POA: Diagnosis not present

## 2022-10-04 DIAGNOSIS — E7849 Other hyperlipidemia: Secondary | ICD-10-CM

## 2022-10-04 DIAGNOSIS — F439 Reaction to severe stress, unspecified: Secondary | ICD-10-CM

## 2022-10-04 DIAGNOSIS — R69 Illness, unspecified: Secondary | ICD-10-CM | POA: Diagnosis not present

## 2022-10-04 NOTE — Progress Notes (Signed)
Chief Complaint:   OBESITY Tina Sutton is here to discuss her progress with her obesity treatment plan along with follow-up of her obesity related diagnoses. Tina Sutton is on the Category 2 Plan or following a lower carbohydrate, vegetable and lean protein rich diet plan and states she is following her eating plan approximately 75% of the time. Tina Sutton states she is doing water aerobics for 60 minutes 2 times per week.  Today's visit was #: 26 Starting weight: 226 lbs Starting date: 12/09/2019 Today's weight: 191 lbs Today's date: 10/04/2022 Total lbs lost to date: 35 Total lbs lost since last in-office visit: 2  Interim History: Tina Sutton's last visit was 3 months ago. She has a lot of challenges but she has done well with increasing her protein and activity.   Subjective:   1. Stress Tina Sutton has had multiple stressors in the last 2 months. She notes this has increased some comfort eating of simple carbohydrates, but she is mindful of this.   2. Muscle cramping Tina Sutton has had increased cramping in her calf, feet, and hands.she notes this is worse in the last 2 months. She is pretty active overall and she has tried to increase her water intake, but not much electrolytes.   3. Vitamin D deficiency Tina Sutton's labs were reviewed today. She is close to goal. She denies nausea, vomiting, or muscle weakness.   4. Other hyperlipidemia Tina Sutton is on Crestor and she is doing well. Her labs were reviewed today and her HDL, triglycerides, and LDL are all at goal.   Assessment/Plan:   1. Stress Tina Sutton was encouraged to continue stress reduction techniques and we will continue to monitor. Emotional eating behavior strategies were discussed.   2. Muscle cramping Tina Sutton was informed of low calorie electrolyte options. She will try these and we will continue to follow.   3. Vitamin D deficiency Tina Sutton is to continue Vitamin D 2,000 IU daily OTC.   4. Other hyperlipidemia Tina Sutton will  continue Crestor 20 mg and will continue her diet. We will continue to follow.   5. Obesity, Current BMI 32.0  6. Obesity, Beginning BMI 38.79 Tina Sutton is currently in the action stage of change. As such, her goal is to continue with weight loss efforts. She has agreed to practicing portion control and making smarter food choices, such as increasing vegetables and decreasing simple carbohydrates.   Exercise goals: For substantial health benefits, adults should do at least 150 minutes (2 hours and 30 minutes) a week of moderate-intensity, or 75 minutes (1 hour and 15 minutes) a week of vigorous-intensity aerobic physical activity, or an equivalent combination of moderate- and vigorous-intensity aerobic activity. Aerobic activity should be performed in episodes of at least 10 minutes, and preferably, it should be spread throughout the week.  Behavioral modification strategies: increasing lean protein intake and no skipping meals.  Tina Sutton has agreed to follow-up with our clinic in 12 weeks. She was informed of the importance of frequent follow-up visits to maximize her success with intensive lifestyle modifications for her multiple health conditions.   Objective:   Blood pressure 103/69, pulse 91, temperature 98.3 F (36.8 C), height  (1.626 m), weight 191 lb (86.6 kg), last menstrual period 06/12/2000, SpO2 99 %. Body mass index is 32.79 kg/m.  Lab Results  Component Value Date   CREATININE 0.76 07/05/2022   BUN 19 07/05/2022   NA 140 07/05/2022   K 4.5 07/05/2022   CL 104 07/05/2022   CO2 24 07/05/2022   Lab  Results  Component Value Date   ALT 20 07/05/2022   AST 19 07/05/2022   ALKPHOS 61 07/05/2022   BILITOT 0.3 07/05/2022   Lab Results  Component Value Date   HGBA1C 5.6 07/05/2022   HGBA1C 5.3 11/24/2021   HGBA1C 5.1 07/04/2021   HGBA1C 5.6 03/07/2021   HGBA1C 5.4 10/18/2020   Lab Results  Component Value Date   INSULIN 8.3 07/05/2022   INSULIN 12.6 12/27/2021    INSULIN 8.8 07/04/2021   INSULIN 10.1 03/07/2021   INSULIN 13.6 10/18/2020   Lab Results  Component Value Date   TSH 2.10 11/24/2021   Lab Results  Component Value Date   CHOL 162 07/05/2022   HDL 83 07/05/2022   LDLCALC 67 07/05/2022   TRIG 63 07/05/2022   CHOLHDL 2 04/12/2022   Lab Results  Component Value Date   VD25OH 49.9 07/05/2022   VD25OH 55.7 12/27/2021   VD25OH 65.4 07/04/2021   Lab Results  Component Value Date   WBC 6.0 11/24/2021   HGB 12.6 11/24/2021   HCT 37.7 11/24/2021   MCV 87.2 11/24/2021   PLT 218.0 11/24/2021   No results found for: "IRON", "TIBC", "FERRITIN"  Attestation Statements:   Reviewed by clinician on day of visit: allergies, medications, problem list, medical history, surgical history, family history, social history, and previous encounter notes.   I, Burt Knack, am acting as transcriptionist for Quillian Quince, MD.  I have reviewed the above documentation for accuracy and completeness, and I agree with the above. -  Quillian Quince, MD

## 2022-10-05 ENCOUNTER — Encounter: Payer: Self-pay | Admitting: Obstetrics and Gynecology

## 2022-10-05 ENCOUNTER — Ambulatory Visit (INDEPENDENT_AMBULATORY_CARE_PROVIDER_SITE_OTHER): Payer: Medicare HMO | Admitting: Obstetrics and Gynecology

## 2022-10-05 VITALS — BP 126/62 | HR 79 | Wt 196.0 lb

## 2022-10-05 DIAGNOSIS — N762 Acute vulvitis: Secondary | ICD-10-CM

## 2022-10-05 LAB — WET PREP FOR TRICH, YEAST, CLUE

## 2022-10-05 MED ORDER — CLOBETASOL PROPIONATE 0.05 % EX OINT: 1.0000 | TOPICAL_OINTMENT | Freq: Two times a day (BID) | CUTANEOUS | 0 refills | Status: DC

## 2022-10-05 NOTE — Progress Notes (Signed)
GYNECOLOGY  VISIT   HPI: 72 y.o.   Single White or Caucasian Not Hispanic or Latino  female   [id number] with Patient's last menstrual period was 06/12/2000.   here for some vaginal itching that has been going on since February. The itching is generalized, getting worse and worse. No vaginal d/c. No change in soap or detergent.       She notes a 1 year h/o an increase in libido, no partner. No hirsutism or acne.   GYNECOLOGIC HISTORY: Patient's last menstrual period was 06/12/2000. Contraception: hysterectomy  Menopausal hormone therapy: none         OB History     Gravida  0   Para  0   Term  0   Preterm  0   AB  0   Living  0      SAB  0   IAB  0   Ectopic  0   Multiple  0   Live Births  0              Patient Active Problem List   Diagnosis Date Noted   BMI 33.0-33.9,adult 07/05/2022   Obesity, Beginning BMI 38.79 07/05/2022   Diabetes mellitus 04/04/2022   Hypertensive retinopathy 08/31/2021   Chronic right hip pain 11/15/2020   Allergic rhinitis 08/30/2020   Chronic kidney disease due to hypertension 08/30/2020   Type 2 diabetes mellitus with other specified complication 08/30/2020   History of malignant neoplasm of skin 08/30/2020   Hyperlipidemia 08/30/2020   Malignant melanoma of face 08/30/2020   Class 2 severe obesity with serious comorbidity and body mass index (BMI) of 38.0 to 38.9 in adult 08/30/2020   Osteopenia 08/30/2020   Personal history of colonic polyps 08/30/2020   Vitamin D deficiency 08/30/2020   Essential hypertension 03/03/2020    Past Medical History:  Diagnosis Date   Depression    Dysmenorrhea    Fibroid    Hyperlipemia    Hyperlipidemia    Hypertension    Inverted nipple x several yrs   bilateral, pt states nipples have always been inverted   Joint pain    Obesity    Sleep apnea    SOB (shortness of breath)    Vitamin D deficiency     Past Surgical History:  Procedure Laterality Date   BLEPHAROPLASTY  Bilateral 02/21/2008   BREAST BIOPSY Right    BREAST EXCISIONAL BIOPSY Right    benign   PELVIC LAPAROSCOPY     TOTAL ABDOMINAL HYSTERECTOMY  05/04/2001   [place] bso.  fibroids    Current Outpatient Medications  Medication Sig Dispense Refill   aspirin EC 81 MG tablet Take 81 mg by mouth daily.     Calcium Carbonate-Vit D-Min (CALCIUM 1200 PO) Take by mouth daily.     Cholecalciferol (VITAMIN D) 50 MCG (2000 UT) CAPS Take by mouth.     hydrochlorothiazide (HYDRODIURIL) 12.5 MG tablet Take 1 tablet (12.5 mg total) by mouth daily. 90 tablet 3   losartan (COZAAR) 100 MG tablet Take 1 tablet (100 mg total) by mouth daily. 90 tablet 3   Probiotic Product (PROBIOTIC-10 PO) Take by mouth.     rosuvastatin (CRESTOR) 20 MG tablet Take 1 tablet by mouth daily.     No current facility-administered medications for this visit.     ALLERGIES: Patient has no known allergies.  Family History  Problem Relation Age of Onset   Hypertension Mother    Alzheimer's disease Mother    Heart  disease Mother    Hyperlipidemia Mother    Depression Mother    Obesity Mother    Hypertension Father    Diabetes Father    Stroke Father    Obesity Father    Breast cancer Sister    Hypertension Sister    Breast cancer Sister    Hypertension Sister    Hypertension Sister     Social History   Socioeconomic History   Marital status: Single    Spouse name: Not on file   Number of children: 0   Years of education: Not on file   Highest education level: Not on file  Occupational History   Occupation: retired  Tobacco Use   Smoking status: Never   Smokeless tobacco: Never  Vaping Use   Vaping Use: Never used  Substance and Sexual Activity   Alcohol use: Yes    Alcohol/week: 1.0 - 5.0 standard drink of alcohol    Types: 1 - 5 Glasses of wine per week   Drug use: No   Sexual activity: Not Currently    Birth control/protection: Post-menopausal, Surgical    Comment: Hysterectomy  Other Topics Concern    Not on file  Social History Narrative   Dog   Retired Location manager [employer]   Social Determinants of Health   Financial Resource Strain: Low Risk  (01/03/2022)   Overall Financial Resource Strain (CARDIA)    Difficulty of Paying Living Expenses: Not hard at all  Food Insecurity: No Food Insecurity (01/03/2022)   Hunger Vital Sign    Worried About Running Out of Food in the Last Year: Never true    Ran Out of Food in the Last Year: Never true  Transportation Needs: No Transportation Needs (01/03/2022)   PRAPARE - Administrator, Civil Service (Medical): No    Lack of Transportation (Non-Medical): No  Physical Activity: Inactive (01/03/2022)   Exercise Vital Sign    Days of Exercise per Week: 0 days    Minutes of Exercise per Session: 0 min  Stress: Not on file  Social Connections: Not on file  Intimate Partner Violence: Not on file    Review of Systems  All other systems reviewed and are negative.   PHYSICAL EXAMINATION:    BP 126/62   Pulse 79   Wt 196 lb (88.9 kg)   LMP 06/12/2000   SpO2 100%   BMI 33.64 kg/m     General appearance: alert, cooperative and appears stated age  Pelvic: External genitalia:  no lesions, diffuse erythema              Urethra:  normal appearing urethra with no masses, tenderness or lesions              Bartholins and Skenes: normal                 Vagina: atrophic appearing vagina with normal color, no discharge, no lesions. The vaginal tissue is so thin that she bleed when touching with a cotton swab              Cervix: absent               Chaperone was present for exam.  1. Acute vulvitis - WET PREP FOR TRICH, YEAST, CLUE: negative - SureSwab Advanced Candida Vaginitis (CV), TMA - clobetasol ointment (TEMOVATE) 0.05 %; Apply 1 Application topically 2 (two) times daily. Apply a small amount topicall twice daily for up to 2 weeks as needed.  Dispense: 60 g; Refill: 0

## 2022-10-06 ENCOUNTER — Telehealth: Payer: Self-pay

## 2022-10-06 DIAGNOSIS — N762 Acute vulvitis: Secondary | ICD-10-CM

## 2022-10-06 LAB — SURESWAB® ADVANCED CANDIDA VAGINITIS (CV), TMA
CANDIDA SPECIES: NOT DETECTED
Candida glabrata: NOT DETECTED

## 2022-10-06 MED ORDER — CLOBETASOL PROPIONATE 0.05 % EX OINT
1.0000 | TOPICAL_OINTMENT | Freq: Two times a day (BID) | CUTANEOUS | 0 refills | Status: DC
Start: 2022-10-06 — End: 2022-11-09

## 2022-10-06 NOTE — Telephone Encounter (Signed)
Pt calling to report that her copay for the clobetasol ointment at her pharmacy is going to be $100.00 and is wondering if there is an alternative.   Pt advised that [facility] can get her script for $22.65 at [facility] and she reported that she could handle that cost.   Pt advised that I will resend script and send her the link to the coupon via mychart.   Pt voiced understanding and appreciation. Will route to provider for final review and close encounter.

## 2022-10-09 DIAGNOSIS — D3612 Benign neoplasm of peripheral nerves and autonomic nervous system, upper limb, including shoulder: Secondary | ICD-10-CM | POA: Diagnosis not present

## 2022-10-09 DIAGNOSIS — L720 Epidermal cyst: Secondary | ICD-10-CM | POA: Diagnosis not present

## 2022-10-09 DIAGNOSIS — L821 Other seborrheic keratosis: Secondary | ICD-10-CM | POA: Diagnosis not present

## 2022-10-09 DIAGNOSIS — Z85828 Personal history of other malignant neoplasm of skin: Secondary | ICD-10-CM | POA: Diagnosis not present

## 2022-10-09 DIAGNOSIS — D225 Melanocytic nevi of trunk: Secondary | ICD-10-CM | POA: Diagnosis not present

## 2022-10-09 DIAGNOSIS — D2261 Melanocytic nevi of right upper limb, including shoulder: Secondary | ICD-10-CM | POA: Diagnosis not present

## 2022-10-09 DIAGNOSIS — L814 Other melanin hyperpigmentation: Secondary | ICD-10-CM | POA: Diagnosis not present

## 2022-10-09 DIAGNOSIS — D1801 Hemangioma of skin and subcutaneous tissue: Secondary | ICD-10-CM | POA: Diagnosis not present

## 2022-10-10 ENCOUNTER — Other Ambulatory Visit: Payer: Self-pay

## 2022-10-10 DIAGNOSIS — N762 Acute vulvitis: Secondary | ICD-10-CM

## 2022-10-11 ENCOUNTER — Ambulatory Visit (INDEPENDENT_AMBULATORY_CARE_PROVIDER_SITE_OTHER): Payer: Medicare HMO | Admitting: Family Medicine

## 2022-10-11 ENCOUNTER — Encounter: Payer: Self-pay | Admitting: Family Medicine

## 2022-10-11 VITALS — BP 128/80 | HR 78 | Temp 98.6°F | Resp 16 | Ht 63.5 in | Wt 199.0 lb

## 2022-10-11 DIAGNOSIS — R7303 Prediabetes: Secondary | ICD-10-CM

## 2022-10-11 DIAGNOSIS — R252 Cramp and spasm: Secondary | ICD-10-CM | POA: Diagnosis not present

## 2022-10-11 DIAGNOSIS — E559 Vitamin D deficiency, unspecified: Secondary | ICD-10-CM | POA: Diagnosis not present

## 2022-10-11 DIAGNOSIS — E7800 Pure hypercholesterolemia, unspecified: Secondary | ICD-10-CM | POA: Diagnosis not present

## 2022-10-11 DIAGNOSIS — I10 Essential (primary) hypertension: Secondary | ICD-10-CM | POA: Diagnosis not present

## 2022-10-11 LAB — CBC WITH DIFFERENTIAL/PLATELET
Basophils Absolute: 0 10*3/uL (ref 0.0–0.1)
Basophils Relative: 0.5 % (ref 0.0–3.0)
Eosinophils Absolute: 0.1 10*3/uL (ref 0.0–0.7)
Eosinophils Relative: 1.8 % (ref 0.0–5.0)
HCT: 33 % — ABNORMAL LOW (ref 36.0–46.0)
Hemoglobin: 10.5 g/dL — ABNORMAL LOW (ref 12.0–15.0)
Lymphocytes Relative: 26 % (ref 12.0–46.0)
Lymphs Abs: 1.7 10*3/uL (ref 0.7–4.0)
MCHC: 31.7 g/dL (ref 30.0–36.0)
MCV: 76.1 fl — ABNORMAL LOW (ref 78.0–100.0)
Monocytes Absolute: 0.5 10*3/uL (ref 0.1–1.0)
Monocytes Relative: 8.1 % (ref 3.0–12.0)
Neutro Abs: 4.3 10*3/uL (ref 1.4–7.7)
Neutrophils Relative %: 63.6 % (ref 43.0–77.0)
Platelets: 194 10*3/uL (ref 150.0–400.0)
RBC: 4.34 Mil/uL (ref 3.87–5.11)
RDW: 16.1 % — ABNORMAL HIGH (ref 11.5–15.5)
WBC: 6.7 10*3/uL (ref 4.0–10.5)

## 2022-10-11 LAB — COMPREHENSIVE METABOLIC PANEL
ALT: 28 U/L (ref 0–35)
AST: 26 U/L (ref 0–37)
Albumin: 3.9 g/dL (ref 3.5–5.2)
Alkaline Phosphatase: 52 U/L (ref 39–117)
BUN: 18 mg/dL (ref 6–23)
CO2: 27 mEq/L (ref 19–32)
Calcium: 9 mg/dL (ref 8.4–10.5)
Chloride: 107 mEq/L (ref 96–112)
Creatinine, Ser: 0.68 mg/dL (ref 0.40–1.20)
GFR: 87.82 mL/min (ref >60.00)
Glucose, Bld: 106 mg/dL — ABNORMAL HIGH (ref 70–99)
Potassium: 4 mEq/L (ref 3.5–5.1)
Sodium: 141 mEq/L (ref 135–145)
Total Bilirubin: 0.4 mg/dL (ref 0.2–1.2)
Total Protein: 6.2 g/dL (ref 6.0–8.3)

## 2022-10-11 LAB — LIPID PANEL
Cholesterol: 150 mg/dL (ref 0–200)
HDL: 72.2 mg/dL (ref >39.00)
LDL Cholesterol: 66 mg/dL (ref 0–99)
NonHDL: 77.54
Total CHOL/HDL Ratio: 2
Triglycerides: 58 mg/dL (ref 0.0–149.0)
VLDL: 11.6 mg/dL (ref 0.0–40.0)

## 2022-10-11 LAB — MAGNESIUM: Magnesium: 2 mg/dL (ref 1.5–2.5)

## 2022-10-11 LAB — HEMOGLOBIN A1C: Hgb A1c MFr Bld: 6 % (ref 4.6–6.5)

## 2022-10-11 LAB — TSH: TSH: 2.19 u[IU]/mL (ref 0.35–5.50)

## 2022-10-11 NOTE — Progress Notes (Signed)
Subjective:     Patient ID: Tina Sutton, female    DOB: [birth year], 72 y.o.   MRN: [id number]  Chief Complaint  Patient presents with   Hyperlipidemia   Hypertension   paperwork    Has brought in some forms that she needs PCP to fill out    Foot Pain    Right heel, ongoing for months Describes the pain as a stabbing pain when she is walking     HPI  HYPERTENSION-Pt is on losartan 100 mg and hydrochloroTHIAZIDE 12.5mg .  Bp's running see [facility] weight/wellness notes  No ha/dizziness/cp/palpation/cough/shortness of breath.  When wears socks, indention  HLD-FH CAD and patient has elevated calcium score.  On ASA and rosuvastatin 20mg  PreDM-working w/healthy weight and wellness on diet/exercise.  A1C was 6.5 10/2019 so DM then. Moving to [facility].  June  There are no preventive care reminders to display for this patient.   Past Medical History:  Diagnosis Date   Depression    Dysmenorrhea    Fibroid    Hyperlipemia    Hyperlipidemia    Hypertension    Inverted nipple x several yrs   bilateral, pt states nipples have always been inverted   Joint pain    Obesity    Sleep apnea    SOB (shortness of breath)    Vitamin D deficiency     Past Surgical History:  Procedure Laterality Date   BLEPHAROPLASTY Bilateral 02/21/2008   BREAST BIOPSY Right    BREAST EXCISIONAL BIOPSY Right    benign   PELVIC LAPAROSCOPY     TOTAL ABDOMINAL HYSTERECTOMY  05/04/2001   [place] bso.  fibroids     Current Outpatient Medications:    aspirin EC 81 MG tablet, Take 81 mg by mouth daily., Disp: , Rfl:    Calcium Carbonate-Vit D-Min (CALCIUM 1200 PO), Take by mouth daily., Disp: , Rfl:    Cholecalciferol (VITAMIN D) 50 MCG (2000 UT) CAPS, Take by mouth., Disp: , Rfl:    clobetasol ointment (TEMOVATE) 0.05 %, Apply 1 Application topically 2 (two) times daily. Apply a small amount topicall twice daily for up to 2 weeks as needed., Disp: 60 g, Rfl: 0    hydrochlorothiazide (HYDRODIURIL) 12.5 MG tablet, Take 1 tablet (12.5 mg total) by mouth daily., Disp: 90 tablet, Rfl: 3   losartan (COZAAR) 100 MG tablet, Take 1 tablet (100 mg total) by mouth daily., Disp: 90 tablet, Rfl: 3   Probiotic Product (PROBIOTIC-10 PO), Take by mouth., Disp: , Rfl:    rosuvastatin (CRESTOR) 20 MG tablet, Take 1 tablet by mouth daily., Disp: , Rfl:   No Known Allergies ROS neg/noncontributory except as noted HPI/below  Occasional cramps in muscles.  Added electrolytes to water.  Walks, etc.   Plantar fasciitis R      Objective:     BP 128/80   Pulse 78   Temp 98.6 F (37 C) (Temporal)   Resp 16   Ht 5' 3.5" (1.613 m)   Wt 199 lb (90.3 kg)   LMP 06/12/2000   SpO2 95%   BMI 34.70 kg/m  Wt Readings from Last 3 Encounters:  10/11/22 199 lb (90.3 kg)  10/05/22 196 lb (88.9 kg)  10/04/22 191 lb (86.6 kg)    Physical Exam   Gen: WDWN NAD HEENT: NCAT, conjunctiva not injected, sclera nonicteric NECK:  supple, no thyromegaly, no nodes, no carotid bruits CARDIAC: RRR, S1S2+, +2/6 SM murmur. DP 2+B LUNGS: CTAB. No wheezes ABDOMEN:  BS+, soft,  NTND, No HSM, no masses EXT:  no edema MSK: no gross abnormalities.  NEURO: A&O x3.  CN II-XII intact.  PSYCH: normal mood. Good eye contact     Assessment & Plan:  Essential hypertension Assessment & Plan: Chronic.  Controlled.  Continue hydrochloroTHIAZIDE 12.5 mg and losartan 100 mg.    Orders: -     Comprehensive metabolic panel -     CBC with Differential/Platelet  Pure hypercholesterolemia Assessment & Plan: Chronic.  Controlled.  Continue crestor 20mg   Orders: -     Lipid panel -     Comprehensive metabolic panel -     TSH  Prediabetes Assessment & Plan: Chronic.  Controlled.  Continue diet/exercise  Orders: -     Comprehensive metabolic panel -     Hemoglobin A1c  Vitamin D deficiency Assessment & Plan: Chronic.  Levels ok   Leg cramps -     Comprehensive metabolic panel -      TSH -     Magnesium  Leg cramps-stretches.  Continue electrolytes.  add Mag 200-400 mg daily  Follow up 63m  Angelena Sole, MD

## 2022-10-11 NOTE — Assessment & Plan Note (Signed)
Chronic.  Controlled.  Continue diet/exercise

## 2022-10-11 NOTE — Progress Notes (Signed)
Labs are stable except hemoglobin has dropped-has she donated blood?  Any dark stools?  Any recent surgical procedures?  When was her last colonoscopy? If she has not donated blood, then needs to take iron daily and repeat CBC, iron studies, B12 in 2 weeks.

## 2022-10-11 NOTE — Patient Instructions (Addendum)
It was very nice to see you today!  Take Magnesium 200-400 mg/day.    coQ10 100 mg daily   PLEASE NOTE:  If you had any lab tests please let us know if you have not heard back within a few days. You may see your results on MyChart before we have a chance to review them but we will give you a call once they are reviewed by [name]. If we ordered any referrals today, please let us know if you have not heard from their office within the next week.   Please try these tips to maintain a healthy lifestyle:  Eat most of your calories during the day when you are active. Eliminate processed foods including packaged sweets (pies, cakes, cookies), reduce intake of potatoes, white bread, white pasta, and white rice. Look for whole grain options, oat flour or almond flour.  Each meal should contain half fruits/vegetables, one quarter protein, and one quarter carbs (no bigger than a computer mouse).  Cut down on sweet beverages. This includes juice, soda, and sweet tea. Also watch fruit intake, though this is a healthier sweet option, it still contains natural sugar! Limit to 3 servings daily.  Drink at least 1 glass of water with each meal and aim for at least 8 glasses per day  Exercise at least 150 minutes every week.

## 2022-10-11 NOTE — Assessment & Plan Note (Signed)
Chronic.  Controlled.  Continue hydrochloroTHIAZIDE 12.5 mg and losartan 100 mg.

## 2022-10-11 NOTE — Assessment & Plan Note (Signed)
Chronic.  Controlled.  Continue crestor 20mg 

## 2022-10-11 NOTE — Assessment & Plan Note (Signed)
Chronic.  Levels ok

## 2022-10-12 ENCOUNTER — Other Ambulatory Visit: Payer: Self-pay

## 2022-10-12 DIAGNOSIS — D649 Anemia, unspecified: Secondary | ICD-10-CM

## 2022-10-25 ENCOUNTER — Other Ambulatory Visit: Payer: Medicare HMO

## 2022-10-27 ENCOUNTER — Other Ambulatory Visit (INDEPENDENT_AMBULATORY_CARE_PROVIDER_SITE_OTHER): Payer: Medicare HMO

## 2022-10-27 DIAGNOSIS — D649 Anemia, unspecified: Secondary | ICD-10-CM | POA: Diagnosis not present

## 2022-10-27 LAB — CBC WITH DIFFERENTIAL/PLATELET
Basophils Absolute: 0 10*3/uL (ref 0.0–0.1)
Basophils Relative: 0.3 % (ref 0.0–3.0)
Eosinophils Absolute: 0.1 10*3/uL (ref 0.0–0.7)
Eosinophils Relative: 2 % (ref 0.0–5.0)
HCT: 36.4 % (ref 36.0–46.0)
Hemoglobin: 11.6 g/dL — ABNORMAL LOW (ref 12.0–15.0)
Lymphocytes Relative: 25.4 % (ref 12.0–46.0)
Lymphs Abs: 1.7 10*3/uL (ref 0.7–4.0)
MCHC: 31.8 g/dL (ref 30.0–36.0)
MCV: 78.3 fl (ref 78.0–100.0)
Monocytes Absolute: 0.7 10*3/uL (ref 0.1–1.0)
Monocytes Relative: 9.9 % (ref 3.0–12.0)
Neutro Abs: 4.2 10*3/uL (ref 1.4–7.7)
Neutrophils Relative %: 62.4 % (ref 43.0–77.0)
Platelets: 160 10*3/uL (ref 150.0–400.0)
RBC: 4.65 Mil/uL (ref 3.87–5.11)
RDW: 19.5 % — ABNORMAL HIGH (ref 11.5–15.5)
WBC: 6.8 10*3/uL (ref 4.0–10.5)

## 2022-10-27 LAB — B12 AND FOLATE PANEL
Folate: 10.7 ng/mL (ref >5.9)
Vitamin B-12: 373 pg/mL (ref 211–911)

## 2022-10-28 ENCOUNTER — Encounter: Payer: Self-pay | Admitting: Family Medicine

## 2022-10-28 LAB — IRON,TIBC AND FERRITIN PANEL
%SAT: 7 % (calc) — ABNORMAL LOW (ref 16–45)
Ferritin: 13 ng/mL — ABNORMAL LOW (ref 16–288)
Iron: 30 ug/dL — ABNORMAL LOW (ref 45–160)
TIBC: 405 mcg/dL (calc) (ref 250–450)

## 2022-10-29 NOTE — Progress Notes (Signed)
Hemoglobin sl better.  Iron is low-when was colonoscopy last and by whom?any bleeding? Has she donated blood?

## 2022-10-30 ENCOUNTER — Other Ambulatory Visit: Payer: Self-pay | Admitting: *Deleted

## 2022-10-30 DIAGNOSIS — E611 Iron deficiency: Secondary | ICD-10-CM

## 2022-11-07 NOTE — Progress Notes (Unsigned)
72 y.o. G0P0000 Single White or Caucasian Not Hispanic or Latino female here for annual exam.  H/O TAH/BSO.    She was treated for a vulvitis in 4/24, feeling better.   Patient's last menstrual period was 06/12/2000.          Sexually active: No.  The current method of family planning is status post hysterectomy.    Exercising: Yes.     She is active  Smoker:  no  Health Maintenance: Pap:  07/02/15 [facility] - per patient normal  History of abnormal Pap:  no MMG:  08/08/22 density C Bi-rads 1 neg  BMD:    11/26/2017 mild osteopenia, T score -1.2, FRAX 14.3/0.8%  Colonoscopy:  11/23/2017 polyps, f/u 2024  TDaP:  06/25/2018 Gardasil: n/a   reports that she has never smoked. She has never used smokeless tobacco. She reports current alcohol use of about 1.0 - 5.0 standard drink of alcohol per week. She reports that she does not use drugs. Retired, she was a Location manager for [employer].   Past Medical History:  Diagnosis Date   Depression    Dysmenorrhea    Fibroid    Hyperlipemia    Hyperlipidemia    Hypertension    Inverted nipple x several yrs   bilateral, pt states nipples have always been inverted   Joint pain    Obesity    Sleep apnea    SOB (shortness of breath)    Vitamin D deficiency     Past Surgical History:  Procedure Laterality Date   BLEPHAROPLASTY Bilateral 02/21/2008   BREAST BIOPSY Right    BREAST EXCISIONAL BIOPSY Right    benign   PELVIC LAPAROSCOPY     TOTAL ABDOMINAL HYSTERECTOMY  05/04/2001   [place] bso.  fibroids    Current Outpatient Medications  Medication Sig Dispense Refill   aspirin EC 81 MG tablet Take 81 mg by mouth daily.     Calcium Carbonate-Vit D-Min (CALCIUM 1200 PO) Take by mouth daily.     Cholecalciferol (VITAMIN D) 50 MCG (2000 UT) CAPS Take by mouth.     hydrochlorothiazide (HYDRODIURIL) 12.5 MG tablet Take 1 tablet (12.5 mg total) by mouth daily. 90 tablet 3   losartan (COZAAR) 100 MG tablet Take 1 tablet (100 mg  total) by mouth daily. 90 tablet 3   Probiotic Product (PROBIOTIC-10 PO) Take by mouth.     rosuvastatin (CRESTOR) 20 MG tablet Take 1 tablet by mouth daily.     No current facility-administered medications for this visit.    Family History  Problem Relation Age of Onset   Hypertension Mother    Alzheimer's disease Mother    Heart disease Mother    Hyperlipidemia Mother    Depression Mother    Obesity Mother    Hypertension Father    Diabetes Father    Stroke Father    Obesity Father    Breast cancer Sister    Hypertension Sister    Breast cancer Sister    Hypertension Sister    Hypertension Sister   She is 1 of 5 girls, 2 had breast cancer. Both were in their 50's. One sister had genetic testing, she thinks it was negative. We have discussed consultation with a genetic counselor.   Review of Systems  All other systems reviewed and are negative.   Exam:   BP 110/74   Pulse 63   Ht 5' 3.39" (1.61 m)   Wt 197 lb (89.4 kg)  LMP 06/12/2000   SpO2 100%   BMI 34.47 kg/m   Weight change: @WEIGHTCHANGE @ Height:   Height: 5' 3.39" (161 cm)  Ht Readings from Last 3 Encounters:  11/09/22 5' 3.39" (1.61 m)  10/11/22 5' 3.5" (1.613 m)  10/04/22 5\' 4"  (1.626 m)    General appearance: alert, cooperative and appears stated age Head: Normocephalic, without obvious abnormality, atraumatic Neck: no adenopathy, supple, symmetrical, trachea midline and thyroid normal to inspection and palpation Breasts: normal appearance, no masses or tenderness Abdomen: soft, non-tender; non distended,  no masses,  no organomegaly Extremities: extremities normal, atraumatic, no cyanosis or edema Skin: Skin color, texture, turgor normal. No rashes or lesions Lymph nodes: Cervical, supraclavicular, and axillary nodes normal. No abnormal inguinal nodes palpated Neurologic: Grossly normal   Pelvic: External genitalia:  no lesions              Urethra:  normal appearing urethra with no masses,  tenderness or lesions              Bartholins and Skenes: normal                 Vagina: normal appearing vagina with normal color and discharge, no lesions              Cervix: absent               Bimanual Exam:  Uterus:  uterus absent              Adnexa: no mass, fullness, tenderness               Rectovaginal: Confirms               Anus:  normal sphincter tone, no lesions  Carolynn Serve, CMA chaperoned for the exam.  1. GYN exam for high-risk Medicare patient Discussed breast self exam No pap needed Mammogram UTD Labs with primary  2. Osteopenia, unspecified location - DG Bone Density; Future -Discussed calcium and vit D intake  3. Colon cancer screening - Ambulatory referral to Gastroenterology

## 2022-11-09 ENCOUNTER — Ambulatory Visit (INDEPENDENT_AMBULATORY_CARE_PROVIDER_SITE_OTHER): Payer: Medicare HMO | Admitting: Obstetrics and Gynecology

## 2022-11-09 ENCOUNTER — Encounter: Payer: Self-pay | Admitting: Obstetrics and Gynecology

## 2022-11-09 VITALS — BP 110/74 | HR 63 | Ht 63.39 in | Wt 197.0 lb

## 2022-11-09 DIAGNOSIS — M8580 Other specified disorders of bone density and structure, unspecified site: Secondary | ICD-10-CM

## 2022-11-09 DIAGNOSIS — Z9189 Other specified personal risk factors, not elsewhere classified: Secondary | ICD-10-CM | POA: Diagnosis not present

## 2022-11-09 DIAGNOSIS — Z1211 Encounter for screening for malignant neoplasm of colon: Secondary | ICD-10-CM

## 2022-11-09 NOTE — Patient Instructions (Signed)

## 2022-11-10 ENCOUNTER — Other Ambulatory Visit: Payer: Self-pay | Admitting: Family Medicine

## 2022-11-16 ENCOUNTER — Other Ambulatory Visit: Payer: Self-pay | Admitting: Obstetrics and Gynecology

## 2022-11-16 DIAGNOSIS — M8580 Other specified disorders of bone density and structure, unspecified site: Secondary | ICD-10-CM

## 2022-11-27 ENCOUNTER — Other Ambulatory Visit (INDEPENDENT_AMBULATORY_CARE_PROVIDER_SITE_OTHER): Payer: Medicare HMO

## 2022-11-27 DIAGNOSIS — E611 Iron deficiency: Secondary | ICD-10-CM | POA: Diagnosis not present

## 2022-11-27 LAB — CBC WITH DIFFERENTIAL/PLATELET
Basophils Absolute: 0 10*3/uL (ref 0.0–0.1)
Basophils Relative: 0.3 % (ref 0.0–3.0)
Eosinophils Absolute: 0.1 10*3/uL (ref 0.0–0.7)
Eosinophils Relative: 1.1 % (ref 0.0–5.0)
HCT: 41.2 % (ref 36.0–46.0)
Hemoglobin: 13 g/dL (ref 12.0–15.0)
Lymphocytes Relative: 20.8 % (ref 12.0–46.0)
Lymphs Abs: 1.9 10*3/uL (ref 0.7–4.0)
MCHC: 31.5 g/dL (ref 30.0–36.0)
MCV: 83 fl (ref 78.0–100.0)
Monocytes Absolute: 0.8 10*3/uL (ref 0.1–1.0)
Monocytes Relative: 8.4 % (ref 3.0–12.0)
Neutro Abs: 6.4 10*3/uL (ref 1.4–7.7)
Neutrophils Relative %: 69.4 % (ref 43.0–77.0)
Platelets: 158 10*3/uL (ref 150.0–400.0)
RBC: 4.96 Mil/uL (ref 3.87–5.11)
RDW: 22.9 % — ABNORMAL HIGH (ref 11.5–15.5)
WBC: 9.2 10*3/uL (ref 4.0–10.5)

## 2022-11-28 LAB — IRON,TIBC AND FERRITIN PANEL
%SAT: 24 % (calc) (ref 16–45)
Ferritin: 11 ng/mL — ABNORMAL LOW (ref 16–288)
Iron: 93 ug/dL (ref 45–160)
TIBC: 391 mcg/dL (calc) (ref 250–450)

## 2022-11-28 NOTE — Progress Notes (Signed)
Hemoglobin normal.  Stores are still low so I would recommend not donating blood for another month and continue iron every other day.  In 1 month, can donate and if continues to donate, may want to do vitamin with iron to keep stores up.

## 2022-11-29 ENCOUNTER — Encounter: Payer: Self-pay | Admitting: Family Medicine

## 2022-12-06 ENCOUNTER — Encounter: Payer: Self-pay | Admitting: Internal Medicine

## 2022-12-06 ENCOUNTER — Telehealth: Payer: Self-pay | Admitting: Internal Medicine

## 2022-12-06 NOTE — Telephone Encounter (Signed)
Okay for routine office visit with me

## 2022-12-06 NOTE — Telephone Encounter (Signed)
Hi Dr. Marina Goodell,  We received a referral for patient to have a colonoscopy done. She does have GI history with [facility] had a colonoscopy done in 2019. She is specifically asking for you to continue his care. His records were obtained and scanned into [employer] for you to review and advise on scheduling.   Thank you

## 2022-12-11 ENCOUNTER — Encounter: Payer: Self-pay | Admitting: Internal Medicine

## 2022-12-11 ENCOUNTER — Ambulatory Visit: Payer: Medicare HMO | Admitting: Internal Medicine

## 2022-12-11 VITALS — BP 122/68 | HR 95 | Ht 63.0 in | Wt 196.0 lb

## 2022-12-11 DIAGNOSIS — D500 Iron deficiency anemia secondary to blood loss (chronic): Secondary | ICD-10-CM | POA: Diagnosis not present

## 2022-12-11 DIAGNOSIS — Z86010 Personal history of colonic polyps: Secondary | ICD-10-CM | POA: Diagnosis not present

## 2022-12-11 DIAGNOSIS — Z52008 Unspecified donor, other blood: Secondary | ICD-10-CM

## 2022-12-11 MED ORDER — NA SULFATE-K SULFATE-MG SULF 17.5-3.13-1.6 GM/177ML PO SOLN: 1.0000 | ORAL | 0 refills | Status: DC

## 2022-12-11 NOTE — Patient Instructions (Signed)
_______________________________________________________  If your blood pressure at your visit was 140/90 or greater, please contact your primary care physician to follow up on this.  _______________________________________________________  If you are age 71 or older, your body mass index should be between 23-30. Your Body mass index is 34.72 kg/m. If this is out of the aforementioned range listed, please consider follow up with your Primary Care Provider.  If you are age 42 or younger, your body mass index should be between 19-25. Your Body mass index is 34.72 kg/m. If this is out of the aformentioned range listed, please consider follow up with your Primary Care Provider.   ________________________________________________________  The [facility] providers would like to encourage you to use [facility] to communicate with providers for non-urgent requests or questions.  Due to long hold times on the telephone, sending your provider a message by [facility] may be a faster and more efficient way to get a response.  Please allow 48 business hours for a response.  Please remember that this is for non-urgent requests.  _______________________________________________________   We have sent the following medications to your pharmacy for you to pick up at your convenience: Suprep  You have been scheduled for an endoscopy and colonoscopy. Please follow the written instructions given to you at your visit today.  Please pick up your prep supplies at the pharmacy within the next 1-3 days.  If you use inhalers (even only as needed), please bring them with you on the day of your procedure.  DO NOT TAKE 7 DAYS PRIOR TO TEST- Trulicity (dulaglutide) Ozempic, Wegovy (semaglutide) Mounjaro (tirzepatide) Bydureon Bcise (exanatide extended release)  DO NOT TAKE 1 DAY PRIOR TO YOUR TEST Rybelsus (semaglutide) Adlyxin (lixisenatide) Victoza (liraglutide) Byetta  (exanatide) ___________________________________________________________________________   Thank you for entrusting me with your care and choosing [facility].  Dr Marina Goodell

## 2022-12-11 NOTE — Progress Notes (Signed)
HISTORY OF PRESENT ILLNESS:  Mckenly Bencomo is a 72 y.o. female, retired Insurance account manager in [employer], presents today to discuss surveillance colonoscopy.  Previous [facility] patient.  She requested transfer care to myself.  Patient underwent colonoscopy in 2014 and most recently June 2019.  Her most recent examination revealed a 4 mm cecal polyp which was removed and found to be irregular adenoma.  She was also noted to have left-sided diverticulosis.  The ileum was normal.  Follow-up in 5 years recommended.  Previous echocardiogram 2019 with normal EF.  Review of blood work from Oct 11, 2022 shows microcytic anemia with hemoglobin 10.5 and MCV 76.1.  Follow-up hemoglobin Oct 27, 2022, after being started on iron, was 11.6.  Iron studies confirmed iron deficiency with a 7% saturation and ferritin level of 13.  B12 and folate were normal.  Patient was concerned about her RDW value being elevated.  She denies melena or hematochezia.  GI review of systems is entirely negative.  She does donate blood on a regular basis, multiple times per year.  Last year she was turned down as a blood donor for the first time, due to anemia.  No family history of GI malignancy.  Her weight has been stable  REVIEW OF SYSTEMS:  All non-GI ROS negative as otherwise stated in the HPI except for meds with, shortness of breath, heart murmur, sleeping problems  Past Medical History:  Diagnosis Date   Depression    Dysmenorrhea    Fibroid    Hyperlipemia    Hyperlipidemia    Hypertension    Inverted nipple x several yrs   bilateral, pt states nipples have always been inverted   Joint pain    Obesity    Sleep apnea    SOB (shortness of breath)    Vitamin D deficiency     Past Surgical History:  Procedure Laterality Date   BLEPHAROPLASTY Bilateral 02/21/2008   BREAST BIOPSY Right    BREAST EXCISIONAL BIOPSY Right    benign   PELVIC LAPAROSCOPY     TOTAL ABDOMINAL HYSTERECTOMY  05/04/2001   [place]  bso.  fibroids    Social History Rhaelyn Corvera  reports that she has never smoked. She has never used smokeless tobacco. She reports current alcohol use of about 1.0 - 5.0 standard drink of alcohol per week. She reports that she does not use drugs.  family history includes Alzheimer's disease in her mother; Breast cancer in her sister and sister; Depression in her mother; Diabetes in her father; Heart disease in her mother; Hyperlipidemia in her mother; Hypertension in her father, mother, sister, sister, and sister; Obesity in her father and mother; Stroke in her father.  No Known Allergies     PHYSICAL EXAMINATION: Vital signs: BP 122/68   Pulse 95   Ht 5\' 3"  (1.6 m)   Wt 196 lb (88.9 kg)   LMP 06/12/2000   BMI 34.72 kg/m   Constitutional: generally well-appearing, no acute distress Psychiatric: alert and oriented x3, cooperative Eyes: extraocular movements intact, anicteric, conjunctiva pink Mouth: oral pharynx moist, no lesions Neck: supple no lymphadenopathy Cardiovascular: heart regular rate and rhythm Lungs: clear to auscultation bilaterally Abdomen: soft, nontender, nondistended, no obvious ascites, no peritoneal signs, normal bowel sounds, no organomegaly Rectal: Deferred to colonoscopy Extremities: no clubbing, cyanosis, or lower extremity edema bilaterally Skin: no lesions on visible extremities Neuro: No focal deficits.  Cranial nerves intact  ASSESSMENT:  1.  Iron deficiency anemia.  Rule out GI  mucosal lesion.  Rule out iron absorptive disorder.  Rule out chronic blood donation. 2.  History of adenomatous colon polyps.  Most recent colonoscopies 2014 and 2019 with [facility].  Reviewed. 3.  General medical problems.  Stable  PLAN:  1.  Colonoscopy to evaluate iron deficiency anemia and provide colorectal neoplasia surveillance.The nature of the procedure, as well as the risks, benefits, and alternatives were carefully and thoroughly reviewed with the patient.  Ample time for discussion and questions allowed. The patient understood, was satisfied, and agreed to proceed. 2.  Upper endoscopy with duodenal biopsies to evaluate iron deficiency anemia.The nature of the procedure, as well as the risks, benefits, and alternatives were carefully and thoroughly reviewed with the patient. Ample time for discussion and questions allowed. The patient understood, was satisfied, and agreed to proceed. 3.  Continue iron replacement therapy, particular if you are going to donate blood. 4.  With asked to hold iron 1 week prior to colonoscopy prep to assist with cleansing.  She understands and agrees A total time of 60 minutes was spent preparing to see the patient, reviewing outside office notes, laboratories, endoscopy reports, and pathology.  Obtaining comprehensive history, performing medically appropriate physical examination, counseling and educating the patient regarding the above listed issues, ordering multiple endoscopic procedures, and documenting clinical information in the health record.

## 2022-12-27 ENCOUNTER — Ambulatory Visit (INDEPENDENT_AMBULATORY_CARE_PROVIDER_SITE_OTHER): Payer: Medicare HMO | Admitting: Family Medicine

## 2023-01-01 ENCOUNTER — Encounter: Payer: Self-pay | Admitting: Internal Medicine

## 2023-01-02 ENCOUNTER — Telehealth: Payer: Self-pay | Admitting: Family Medicine

## 2023-01-02 ENCOUNTER — Other Ambulatory Visit: Payer: Self-pay

## 2023-01-02 DIAGNOSIS — G8929 Other chronic pain: Secondary | ICD-10-CM

## 2023-01-02 NOTE — Telephone Encounter (Signed)
New PT referral placed to external facility.

## 2023-01-02 NOTE — Telephone Encounter (Signed)
The patient would like to have physical therapy done at [facility].  Can this referral be entered and sent to them (fax # [phone])?

## 2023-01-03 ENCOUNTER — Ambulatory Visit (INDEPENDENT_AMBULATORY_CARE_PROVIDER_SITE_OTHER): Payer: Medicare HMO | Admitting: Family Medicine

## 2023-01-03 ENCOUNTER — Encounter (INDEPENDENT_AMBULATORY_CARE_PROVIDER_SITE_OTHER): Payer: Self-pay | Admitting: Family Medicine

## 2023-01-03 VITALS — BP 130/69 | HR 66 | Temp 97.6°F | Ht 64.0 in | Wt 193.0 lb

## 2023-01-03 DIAGNOSIS — E559 Vitamin D deficiency, unspecified: Secondary | ICD-10-CM | POA: Diagnosis not present

## 2023-01-03 DIAGNOSIS — E669 Obesity, unspecified: Secondary | ICD-10-CM

## 2023-01-03 DIAGNOSIS — E7800 Pure hypercholesterolemia, unspecified: Secondary | ICD-10-CM | POA: Diagnosis not present

## 2023-01-03 DIAGNOSIS — Z6833 Body mass index (BMI) 33.0-33.9, adult: Secondary | ICD-10-CM | POA: Diagnosis not present

## 2023-01-03 DIAGNOSIS — E1169 Type 2 diabetes mellitus with other specified complication: Secondary | ICD-10-CM

## 2023-01-03 NOTE — Progress Notes (Unsigned)
Chief Complaint:   OBESITY Tina Sutton is here to discuss her progress with her obesity treatment plan along with follow-up of her obesity related diagnoses. Tina Sutton is on practicing portion control and making smarter food choices, such as increasing vegetables and decreasing simple carbohydrates and states she is following her eating plan approximately 50% of the time. Tina Sutton states she is doing 0 minutes 0 times per week.  Today's visit was #: 27 Starting weight: 226 lbs Starting date: 12/09/2019 Today's weight: 193 lbs Today's date: 01/03/2023 Total lbs lost to date: 33 Total lbs lost since last in-office visit: 0  Interim History: Patient has been moving and has gotten off track with her meal plan.  She is eating more protein bars.  She is working on portion control and [employer], but her carbohydrates have been creeping up.  Subjective:   1. Type 2 diabetes mellitus with other specified complication, without long-term current use of insulin (HCC) Patient is controlling with her diet, and she is due for labs.  She notes increased sugar intake recently, but she is working on improving this.  2. Pure hypercholesterolemia Patient is on Crestor, and she is working on decreasing cholesterol in her diet.  No side effects were noted.  3. Vitamin D deficiency Patient is on a OTC vitamin D with no side effects noted.  She is due for labs.  Assessment/Plan:   1. Type 2 diabetes mellitus with other specified complication, without long-term current use of insulin (HCC) We will check labs today, and we will follow-up at her next visit.  Patient will continue to work on her diet.  - CMP14+EGFR - Vitamin B12 - Hemoglobin A1c - Insulin, random  2. Pure hypercholesterolemia We will check labs today, and we will follow-up at her next visit.  Patient will continue to work on her diet.  - Lipid Panel With LDL/HDL Ratio  3. Vitamin D deficiency We will check labs today,  and we will follow-up at patient's next visit.  - VITAMIN D 25 Hydroxy (Vit-D Deficiency, Fractures)  4. BMI 33.0-33.9,adult  5. Obesity, Beginning BMI 38.79 Tina Sutton is currently in the action stage of change. As such, her goal is to continue with weight loss efforts. She has agreed to practicing portion control and making smarter food choices, such as increasing vegetables and decreasing simple carbohydrates.   Behavioral modification strategies: increasing lean protein intake.  Tina Sutton has agreed to follow-up with our clinic in 12 weeks. She was informed of the importance of frequent follow-up visits to maximize her success with intensive lifestyle modifications for her multiple health conditions.   Tina Sutton was informed we would discuss her lab results at her next visit unless there is a critical issue that needs to be addressed sooner. Tina Sutton agreed to keep her next visit at the agreed upon time to discuss these results.  Objective:   Blood pressure 130/69, pulse 66, temperature 97.6 F (36.4 C), height 5\' 4"  (1.626 m), weight 193 lb (87.5 kg), last menstrual period 06/12/2000, SpO2 97%. Body mass index is 33.13 kg/m.  Lab Results  Component Value Date   CREATININE 0.68 10/11/2022   BUN 18 10/11/2022   NA 141 10/11/2022   K 4.0 10/11/2022   CL 107 10/11/2022   CO2 27 10/11/2022   Lab Results  Component Value Date   ALT 28 10/11/2022   AST 26 10/11/2022   ALKPHOS 52 10/11/2022   BILITOT 0.4 10/11/2022   Lab Results  Component Value  Date   HGBA1C 6.0 10/11/2022   HGBA1C 5.6 07/05/2022   HGBA1C 5.3 11/24/2021   HGBA1C 5.1 07/04/2021   HGBA1C 5.6 03/07/2021   Lab Results  Component Value Date   INSULIN 8.3 07/05/2022   INSULIN 12.6 12/27/2021   INSULIN 8.8 07/04/2021   INSULIN 10.1 03/07/2021   INSULIN 13.6 10/18/2020   Lab Results  Component Value Date   TSH 2.19 10/11/2022   Lab Results  Component Value Date   CHOL 150 10/11/2022   HDL 72.20 10/11/2022    LDLCALC 66 10/11/2022   TRIG 58.0 10/11/2022   CHOLHDL 2 10/11/2022   Lab Results  Component Value Date   VD25OH 49.9 07/05/2022   VD25OH 55.7 12/27/2021   VD25OH 65.4 07/04/2021   Lab Results  Component Value Date   WBC 9.2 11/27/2022   HGB 13.0 11/27/2022   HCT 41.2 11/27/2022   MCV 83.0 11/27/2022   PLT 158.0 11/27/2022   Lab Results  Component Value Date   IRON 93 11/27/2022   TIBC 391 11/27/2022   FERRITIN 11 (L) 11/27/2022   Attestation Statements:   Reviewed by clinician on day of visit: allergies, medications, problem list, medical history, surgical history, family history, social history, and previous encounter notes.   I, Burt Knack, am acting as transcriptionist for Quillian Quince, MD.  I have reviewed the above documentation for accuracy and completeness, and I agree with the above. -  Quillian Quince, MD

## 2023-01-04 ENCOUNTER — Ambulatory Visit (AMBULATORY_SURGERY_CENTER): Payer: Medicare HMO | Admitting: Internal Medicine

## 2023-01-04 ENCOUNTER — Encounter: Payer: Self-pay | Admitting: Internal Medicine

## 2023-01-04 VITALS — BP 135/69 | HR 61 | Temp 97.3°F | Resp 12 | Ht 63.0 in | Wt 196.0 lb

## 2023-01-04 DIAGNOSIS — D500 Iron deficiency anemia secondary to blood loss (chronic): Secondary | ICD-10-CM

## 2023-01-04 DIAGNOSIS — K5520 Angiodysplasia of colon without hemorrhage: Secondary | ICD-10-CM

## 2023-01-04 DIAGNOSIS — Z09 Encounter for follow-up examination after completed treatment for conditions other than malignant neoplasm: Secondary | ICD-10-CM | POA: Diagnosis not present

## 2023-01-04 DIAGNOSIS — I10 Essential (primary) hypertension: Secondary | ICD-10-CM | POA: Diagnosis not present

## 2023-01-04 DIAGNOSIS — D509 Iron deficiency anemia, unspecified: Secondary | ICD-10-CM

## 2023-01-04 DIAGNOSIS — Z86010 Personal history of colonic polyps: Secondary | ICD-10-CM

## 2023-01-04 DIAGNOSIS — G4733 Obstructive sleep apnea (adult) (pediatric): Secondary | ICD-10-CM | POA: Diagnosis not present

## 2023-01-04 LAB — CMP14+EGFR
ALT: 45 IU/L — ABNORMAL HIGH (ref 0–32)
AST: 37 IU/L (ref 0–40)
Alkaline Phosphatase: 59 IU/L (ref 44–121)
BUN/Creatinine Ratio: 22 (ref 12–28)
BUN: 15 mg/dL (ref 8–27)
Bilirubin Total: 0.5 mg/dL (ref 0.0–1.2)
CO2: 25 mmol/L (ref 20–29)
Calcium: 9.7 mg/dL (ref 8.7–10.3)
Chloride: 101 mmol/L (ref 96–106)
Globulin, Total: 2.3 g/dL (ref 1.5–4.5)
Glucose: 85 mg/dL (ref 70–99)
Potassium: 4.4 mmol/L (ref 3.5–5.2)
Sodium: 140 mmol/L (ref 134–144)
Total Protein: 6.8 g/dL (ref 6.0–8.5)
eGFR: 93 mL/min/{1.73_m2} (ref >59)

## 2023-01-04 LAB — VITAMIN B12: Vitamin B-12: 614 pg/mL (ref 232–1245)

## 2023-01-04 LAB — LIPID PANEL WITH LDL/HDL RATIO
Cholesterol, Total: 146 mg/dL (ref 100–199)
HDL: 77 mg/dL (ref >39)
LDL Chol Calc (NIH): 55 mg/dL (ref 0–99)
LDL/HDL Ratio: 0.7 ratio (ref 0.0–3.2)
Triglycerides: 74 mg/dL (ref 0–149)
VLDL Cholesterol Cal: 14 mg/dL (ref 5–40)

## 2023-01-04 LAB — INSULIN, RANDOM: INSULIN: 10.7 u[IU]/mL (ref 2.6–24.9)

## 2023-01-04 LAB — HEMOGLOBIN A1C: Est. average glucose Bld gHb Est-mCnc: 123 mg/dL

## 2023-01-04 MED ORDER — SODIUM CHLORIDE 0.9 % IV SOLN: 500.0000 mL | Freq: Once | INTRAVENOUS | Status: DC

## 2023-01-04 NOTE — Progress Notes (Signed)
Report to PACU, RN, vss, BBS= Clear.  

## 2023-01-04 NOTE — Progress Notes (Signed)
Called to room to assist during endoscopic procedure.  Patient ID and intended procedure confirmed with present staff. Received instructions for my participation in the procedure from the performing physician.  

## 2023-01-04 NOTE — Op Note (Signed)
[facility] Patient Name: Tina Sutton Procedure Date: 01/04/2023 1:41 PM MRN: [id number] Endoscopist: Wilhemina Bonito. Marina Goodell , MD, [id number] Age: 72 Referring MD:  Date of Birth: 1951-03-27 Gender: Female Account #: [id number] Procedure:                Upper GI endoscopy with biopsies Indications:              Iron deficiency anemia Medicines:                Monitored Anesthesia Care Procedure:                Pre-Anesthesia Assessment:                           - Prior to the procedure, a History and Physical                            was performed, and patient medications and                            allergies were reviewed. The patient's tolerance of                            previous anesthesia was also reviewed. The risks                            and benefits of the procedure and the sedation                            options and risks were discussed with the patient.                            All questions were answered, and informed consent                            was obtained. Prior Anticoagulants: The patient has                            taken no anticoagulant or antiplatelet agents. ASA                            Grade Assessment: II - A patient with mild systemic                            disease. After reviewing the risks and benefits,                            the patient was deemed in satisfactory condition to                            undergo the procedure.                           After obtaining informed consent, the endoscope was  passed under direct vision. Throughout the                            procedure, the patient's blood pressure, pulse, and                            oxygen saturations were monitored continuously. The                            GIF [id number] #[id number] was introduced through the                            mouth, and advanced to the third part of duodenum.                            The upper GI  endoscopy was accomplished without                            difficulty. The patient tolerated the procedure                            well. Scope In: Scope Out: Findings:                 The esophagus was normal.                           The stomach was normal. Hiatal hernia without                            erosions.                           The examined duodenum was normal. Biopsies for                            histology were taken with a cold forceps for                            evaluation of celiac disease.                           The cardia and gastric fundus were normal on                            retroflexion. Complications:            No immediate complications. Estimated Blood Loss:     Estimated blood loss: none. Impression:               - Normal esophagus.                           - Normal stomach. Hiatal hernia.                           - Normal examined duodenum. Biopsied.                           -  Iron deficiency anemia may be secondary to                            chronic blood donation and/or colonic AVMs. Recommendation:           - Patient has a contact number available for                            emergencies. The signs and symptoms of potential                            delayed complications were discussed with the                            patient. Return to normal activities tomorrow.                            Written discharge instructions were provided to the                            patient.                           - Resume previous diet.                           - Continue present medications.                           - Await pathology results.                           - Return to the care of your primary provider                           ***Resume iron supplement twice daily. Have your                            primary care provider monitor your blood counts.                           When blood counts normalize, I recommend  continuing                            a daily iron supplement, once daily, indefinitely Dorman Calderwood N. Marina Goodell, MD 01/04/2023 2:37:18 PM This report has been signed electronically.

## 2023-01-04 NOTE — Patient Instructions (Signed)
Recommendation:           - Patient has a contact number available for                            emergencies. The signs and symptoms of potential                            delayed complications were discussed with the                            patient. Return to normal activities tomorrow.                            Written discharge instructions were provided to the                            patient.                           - Resume previous diet.                           - Continue present medications.                           - Await pathology results.                           - Return to the care of your primary provider                           Resume iron supplement twice daily. Have your                            primary care provider monitor your blood counts.                           When blood counts normalize, I recommend continuing                            a daily iron supplement, once daily, indefinitely Recommendation:           - Repeat colonoscopy is not recommended for                            surveillance.                           - Patient has a contact number available for                            emergencies. The signs and symptoms of potential                            delayed complications were discussed with the  patient. Return to normal activities tomorrow.                            Written discharge instructions were provided to the                            patient.                           - Resume previous diet.                           - Continue present medications.                           -EGD today. Please see report regarding findings                            and final impression/recommendations.  Handout on diverticulosis given.  YOU HAD AN ENDOSCOPIC PROCEDURE TODAY AT THE [facility]:   Refer to the procedure report that was given to you for any specific questions about what was found  during the examination.  If the procedure report does not answer your questions, please call your gastroenterologist to clarify.  If you requested that your care partner not be given the details of your procedure findings, then the procedure report has been included in a sealed envelope for you to review at your convenience later.  YOU SHOULD EXPECT: Some feelings of bloating in the abdomen. Passage of more gas than usual.  Walking can help get rid of the air that was put into your GI tract during the procedure and reduce the bloating. If you had a lower endoscopy (such as a colonoscopy or flexible sigmoidoscopy) you may notice spotting of blood in your stool or on the toilet paper. If you underwent a bowel prep for your procedure, you may not have a normal bowel movement for a few days.  Please Note:  You might notice some irritation and congestion in your nose or some drainage.  This is from the oxygen used during your procedure.  There is no need for concern and it should clear up in a day or so.  SYMPTOMS TO REPORT IMMEDIATELY:  Following lower endoscopy (colonoscopy or flexible sigmoidoscopy):  Excessive amounts of blood in the stool  Significant tenderness or worsening of abdominal pains  Swelling of the abdomen that is new, acute  Fever of 100F or higher  Following upper endoscopy (EGD)  Vomiting of blood or coffee ground material  New chest pain or pain under the shoulder blades  Painful or persistently difficult swallowing  New shortness of breath  Fever of 100F or higher  Black, tarry-looking stools  For urgent or emergent issues, a gastroenterologist can be reached at any hour by calling [phone]. Do not use MyChart messaging for urgent concerns.    DIET:  We do recommend a small meal at first, but then you may proceed to your regular diet.  Drink plenty of fluids but you should avoid alcoholic beverages for 24 hours.  ACTIVITY:  You should plan to take it easy for the  rest of today and you should NOT DRIVE or use heavy machinery until tomorrow (because of the sedation medicines used during  the test).    FOLLOW UP: Our staff will call the number listed on your records the next business day following your procedure.  We will call around 7:15- 8:00 am to check on you and address any questions or concerns that you may have regarding the information given to you following your procedure. If we do not reach you, we will leave a message.     If any biopsies were taken you will be contacted by phone or by letter within the next 1-3 weeks.  Please call us at [phone] if you have not heard about the biopsies in 3 weeks.    SIGNATURES/CONFIDENTIALITY: You and/or your care partner have signed paperwork which will be entered into your electronic medical record.  These signatures attest to the fact that that the information above on your After Visit Summary has been reviewed and is understood.  Full responsibility of the confidentiality of this discharge information lies with you and/or your care-partner.

## 2023-01-04 NOTE — Progress Notes (Signed)
HISTORY OF PRESENT ILLNESS:   Tina Sutton is a 72 y.o. female, retired Insurance account manager in [employer], presents today to discuss surveillance colonoscopy.  Previous [facility] patient.  She requested transfer care to myself.   Patient underwent colonoscopy in 2014 and most recently June 2019.  Her most recent examination revealed a 4 mm cecal polyp which was removed and found to be irregular adenoma.  She was also noted to have left-sided diverticulosis.  The ileum was normal.  Follow-up in 5 years recommended.   Previous echocardiogram 2019 with normal EF.   Review of blood work from Oct 11, 2022 shows microcytic anemia with hemoglobin 10.5 and MCV 76.1.  Follow-up hemoglobin Oct 27, 2022, after being started on iron, was 11.6.  Iron studies confirmed iron deficiency with a 7% saturation and ferritin level of 13.  B12 and folate were normal.  Patient was concerned about her RDW value being elevated.  She denies melena or hematochezia.  GI review of systems is entirely negative.  She does donate blood on a regular basis, multiple times per year.  Last year she was turned down as a blood donor for the first time, due to anemia.  No family history of GI malignancy.  Her weight has been stable   REVIEW OF SYSTEMS:   All non-GI ROS negative as otherwise stated in the HPI except for meds with, shortness of breath, heart murmur, sleeping problems       Past Medical History:  Diagnosis Date   Depression     Dysmenorrhea     Fibroid     Hyperlipemia     Hyperlipidemia     Hypertension     Inverted nipple x several yrs    bilateral, pt states nipples have always been inverted   Joint pain     Obesity     Sleep apnea     SOB (shortness of breath)     Vitamin D deficiency                 Past Surgical History:  Procedure Laterality Date   BLEPHAROPLASTY Bilateral 02/21/2008   BREAST BIOPSY Right     BREAST EXCISIONAL BIOPSY Right      benign   PELVIC LAPAROSCOPY       TOTAL  ABDOMINAL HYSTERECTOMY   05/04/2001    [place] [facility].  fibroids          Social History Haivyn Boyers  reports that she has never smoked. She has never used smokeless tobacco. She reports current alcohol use of about 1.0 - 5.0 standard drink of alcohol per week. She reports that she does not use drugs.   family history includes Alzheimer's disease in her mother; Breast cancer in her sister and sister; Depression in her mother; Diabetes in her father; Heart disease in her mother; Hyperlipidemia in her mother; Hypertension in her father, mother, sister, sister, and sister; Obesity in her father and mother; Stroke in her father.   Allergies  No Known Allergies         PHYSICAL EXAMINATION: Vital signs: BP 122/68   Pulse 95   Ht 5\' 3"  (1.6 m)   Wt 196 lb (88.9 kg)   LMP 06/12/2000   BMI 34.72 kg/m   Constitutional: generally well-appearing, no acute distress Psychiatric: alert and oriented x3, cooperative Eyes: extraocular movements intact, anicteric, conjunctiva pink Mouth: oral pharynx moist, no lesions Neck: supple no lymphadenopathy Cardiovascular: heart regular rate and rhythm Lungs: clear to auscultation  bilaterally Abdomen: soft, nontender, nondistended, no obvious ascites, no peritoneal signs, normal bowel sounds, no organomegaly Rectal: Deferred to colonoscopy Extremities: no clubbing, cyanosis, or lower extremity edema bilaterally Skin: no lesions on visible extremities Neuro: No focal deficits.  Cranial nerves intact   ASSESSMENT:   1.  Iron deficiency anemia.  Rule out GI mucosal lesion.  Rule out iron absorptive disorder.  Rule out chronic blood donation. 2.  History of adenomatous colon polyps.  Most recent colonoscopies 2014 and 2019 with [facility].  Reviewed. 3.  General medical problems.  Stable   PLAN:   1.  Colonoscopy to evaluate iron deficiency anemia and provide colorectal neoplasia surveillance.The nature of the procedure, as well as the risks, benefits,  and alternatives were carefully and thoroughly reviewed with the patient. Ample time for discussion and questions allowed. The patient understood, was satisfied, and agreed to proceed. 2.  Upper endoscopy with duodenal biopsies to evaluate iron deficiency anemia.The nature of the procedure, as well as the risks, benefits, and alternatives were carefully and thoroughly reviewed with the patient. Ample time for discussion and questions allowed. The patient understood, was satisfied, and agreed to proceed. 3.  Continue iron replacement therapy, particular if you are going to donate blood. 4.  With asked to hold iron 1 week prior to colonoscopy prep to assist with cleansing.  She understands and agrees

## 2023-01-04 NOTE — Op Note (Signed)
[facility] Patient Name: Tina Sutton Procedure Date: 01/04/2023 1:47 PM MRN: [id number] Endoscopist: Wilhemina Bonito. Marina Goodell , MD, [id number] Age: 72 Referring MD:  Date of Birth: [birth year] Gender: Female Account #: [id number] Procedure:                Colonoscopy Indications:              Iron deficiency anemia. Previous examinations with                            [facility] 2014, 2019. Nonadvanced adenoma. Medicines:                Monitored Anesthesia Care Procedure:                Pre-Anesthesia Assessment:                           - Prior to the procedure, a History and Physical                            was performed, and patient medications and                            allergies were reviewed. The patient's tolerance of                            previous anesthesia was also reviewed. The risks                            and benefits of the procedure and the sedation                            options and risks were discussed with the patient.                            All questions were answered, and informed consent                            was obtained. Prior Anticoagulants: The patient has                            taken no anticoagulant or antiplatelet agents. ASA                            Grade Assessment: II - A patient with mild systemic                            disease. After reviewing the risks and benefits,                            the patient was deemed in satisfactory condition to                            undergo the procedure.  After obtaining informed consent, the colonoscope                            was passed under direct vision. Throughout the                            procedure, the patient's blood pressure, pulse, and                            oxygen saturations were monitored continuously. The                            CF [id number] #[id number] was introduced through the anus                            and advanced  to the the cecum, identified by                            appendiceal orifice and ileocecal valve. The                            ileocecal valve, appendiceal orifice, and rectum                            were photographed. The quality of the bowel                            preparation was excellent. The colonoscopy was                            performed without difficulty. The patient tolerated                            the procedure well. The bowel preparation used was                            SUPREP via split dose instruction. Scope In: 2:00:19 PM Scope Out: 2:18:56 PM Scope Withdrawal Time: 0 hours 9 minutes 14 seconds  Total Procedure Duration: 0 hours 18 minutes 37 seconds  Findings:                 A few angiodysplastic lesions without bleeding were                            found in the cecum.                           Many diverticula were found in the left colon.                            There was rectosigmoid stenosis.                           The exam was otherwise without abnormality on  direct and retroflexion views. Complications:            No immediate complications. Estimated blood loss:                            None. Estimated Blood Loss:     Estimated blood loss: none. Impression:               - A few non-bleeding colonic angiodysplastic                            lesions. This may cause iron deficiency anemia.                           - Diverticulosis in the left colon. Rectosigmoid                            stenosis.                           - The examination was otherwise normal on direct                            and retroflexion views.                           - No specimens collected. Recommendation:           - Repeat colonoscopy is not recommended for                            surveillance.                           - Patient has a contact number available for                            emergencies. The signs  and symptoms of potential                            delayed complications were discussed with the                            patient. Return to normal activities tomorrow.                            Written discharge instructions were provided to the                            patient.                           - Resume previous diet.                           - Continue present medications.                           -  EGD today. Please see report regarding findings                            and final impression/recommendations. Wilhemina Bonito. Marina Goodell, MD 01/04/2023 2:24:14 PM This report has been signed electronically.

## 2023-01-05 ENCOUNTER — Telehealth: Payer: Self-pay | Admitting: *Deleted

## 2023-01-05 NOTE — Telephone Encounter (Signed)
  Follow up Call-     01/04/2023    1:28 PM  Call back number  Post procedure Call Back phone  # [phone]  Permission to leave phone message Yes     Patient questions:  Do you have a fever, pain , or abdominal swelling? No. Pain Score  0 *  Have you tolerated food without any problems? Yes.    Have you been able to return to your normal activities? Yes.    Do you have any questions about your discharge instructions: Diet   No. Medications  No. Follow up visit  No.  Do you have questions or concerns about your Care? No.  Actions: * If pain score is 4 or above: No action needed, pain <4.

## 2023-01-10 ENCOUNTER — Encounter: Payer: Self-pay | Admitting: Internal Medicine

## 2023-03-12 ENCOUNTER — Ambulatory Visit: Payer: Medicare HMO | Admitting: Internal Medicine

## 2023-04-05 ENCOUNTER — Ambulatory Visit (INDEPENDENT_AMBULATORY_CARE_PROVIDER_SITE_OTHER): Payer: Medicare HMO | Admitting: Family Medicine

## 2023-04-13 ENCOUNTER — Encounter: Payer: Self-pay | Admitting: Family Medicine

## 2023-04-13 ENCOUNTER — Ambulatory Visit (INDEPENDENT_AMBULATORY_CARE_PROVIDER_SITE_OTHER): Payer: Medicare HMO | Admitting: Family Medicine

## 2023-04-13 VITALS — BP 128/68 | HR 67 | Temp 98.9°F | Resp 18 | Ht 64.0 in | Wt 207.2 lb

## 2023-04-13 DIAGNOSIS — I10 Essential (primary) hypertension: Secondary | ICD-10-CM | POA: Diagnosis not present

## 2023-04-13 DIAGNOSIS — E7800 Pure hypercholesterolemia, unspecified: Secondary | ICD-10-CM

## 2023-04-13 DIAGNOSIS — R7303 Prediabetes: Secondary | ICD-10-CM

## 2023-04-13 DIAGNOSIS — L84 Corns and callosities: Secondary | ICD-10-CM

## 2023-04-13 DIAGNOSIS — E611 Iron deficiency: Secondary | ICD-10-CM

## 2023-04-13 LAB — COMPREHENSIVE METABOLIC PANEL
ALT: 29 U/L (ref 0–35)
AST: 25 U/L (ref 0–37)
Albumin: 4.3 g/dL (ref 3.5–5.2)
Alkaline Phosphatase: 55 U/L (ref 39–117)
BUN: 18 mg/dL (ref 6–23)
CO2: 29 meq/L (ref 19–32)
Calcium: 9.5 mg/dL (ref 8.4–10.5)
Chloride: 105 meq/L (ref 96–112)
Creatinine, Ser: 0.74 mg/dL (ref 0.40–1.20)
GFR: 81.29 mL/min (ref >60.00)
Glucose, Bld: 96 mg/dL (ref 70–99)
Potassium: 4.1 meq/L (ref 3.5–5.1)
Sodium: 140 meq/L (ref 135–145)
Total Bilirubin: 0.6 mg/dL (ref 0.2–1.2)
Total Protein: 6.8 g/dL (ref 6.0–8.3)

## 2023-04-13 LAB — CBC WITH DIFFERENTIAL/PLATELET
Basophils Absolute: 0 10*3/uL (ref 0.0–0.1)
Basophils Relative: 0.5 % (ref 0.0–3.0)
Eosinophils Absolute: 0.1 10*3/uL (ref 0.0–0.7)
Eosinophils Relative: 1.3 % (ref 0.0–5.0)
HCT: 41.6 % (ref 36.0–46.0)
Hemoglobin: 13.4 g/dL (ref 12.0–15.0)
Lymphocytes Relative: 28.4 % (ref 12.0–46.0)
Lymphs Abs: 1.9 10*3/uL (ref 0.7–4.0)
MCHC: 32.1 g/dL (ref 30.0–36.0)
MCV: 93.4 fL (ref 78.0–100.0)
Monocytes Absolute: 0.6 10*3/uL (ref 0.1–1.0)
Monocytes Relative: 8.7 % (ref 3.0–12.0)
Neutro Abs: 4.2 10*3/uL (ref 1.4–7.7)
Neutrophils Relative %: 61.1 % (ref 43.0–77.0)
Platelets: 168 10*3/uL (ref 150.0–400.0)
RBC: 4.46 Mil/uL (ref 3.87–5.11)
RDW: 13.4 % (ref 11.5–15.5)
WBC: 6.8 10*3/uL (ref 4.0–10.5)

## 2023-04-13 LAB — LIPID PANEL
Cholesterol: 161 mg/dL (ref 0–200)
HDL: 73.1 mg/dL (ref >39.00)
LDL Cholesterol: 74 mg/dL (ref 0–99)
NonHDL: 87.77
Total CHOL/HDL Ratio: 2
Triglycerides: 71 mg/dL (ref 0.0–149.0)
VLDL: 14.2 mg/dL (ref 0.0–40.0)

## 2023-04-13 LAB — HEMOGLOBIN A1C: Hgb A1c MFr Bld: 5.3 % (ref 4.6–6.5)

## 2023-04-13 LAB — MICROALBUMIN / CREATININE URINE RATIO
Creatinine,U: 80.6 mg/dL
Microalb Creat Ratio: 0.9 mg/g (ref 0.0–30.0)
Microalb, Ur: <0.7 mg/dL (ref 0.0–1.9)

## 2023-04-13 LAB — IBC + FERRITIN
Ferritin: 12.8 ng/mL (ref 10.0–291.0)
Iron: 131 ug/dL (ref 42–145)
Saturation Ratios: 31.7 % (ref 20.0–50.0)
TIBC: 413 ug/dL (ref 250.0–450.0)
Transferrin: 295 mg/dL (ref 212.0–360.0)

## 2023-04-13 LAB — VITAMIN B12: Vitamin B-12: 481 pg/mL (ref 211–911)

## 2023-04-13 MED ORDER — LOSARTAN POTASSIUM 100 MG PO TABS: 100.0000 mg | ORAL_TABLET | Freq: Every day | ORAL | 3 refills | Status: DC

## 2023-04-13 MED ORDER — ROSUVASTATIN CALCIUM 20 MG PO TABS: 20.0000 mg | ORAL_TABLET | Freq: Every day | ORAL | 3 refills | Status: DC

## 2023-04-13 MED ORDER — HYDROCHLOROTHIAZIDE 12.5 MG PO TABS: 12.5000 mg | ORAL_TABLET | Freq: Every day | ORAL | 3 refills | Status: DC

## 2023-04-13 NOTE — Progress Notes (Signed)
Subjective:    Patient ID: Tina Sutton, female    DOB: 1950-07-21, 72 y.o.   MRN: [id number]  Chief Complaint  Patient presents with   Medical Management of Chronic Issues    6 month follow-up on htn, cholesterol Discuss COQ10 No food, coffee and a little bit of orange juice   HPI HTN - Managed with Losartan 100 mg and Hydrochlorothiazide 12.5 mg. BP's running 143/71 on initial check, 128/68 on recheck, and hasn't checked within past 3 months at home (no concerning numbers). Endorses mild LE edema, is going to get compression stocking. Reports she has been walking a lot more, recently hit 13k steps, and her avg daily step count has risen. Denies ha/dizziness/chest pains/palp/cough/sob.   HLD - Fhx of CAD; she has had an elevated calcium score. Managed with ASA, and Rosuvastatin 20 mg. Would like to discuss COQ10. No myalgias  Iron Deficiency Anemia - Managed with ferro-sulfate daily. Reports that she recently donated blood towards the end of Sept.   Caregiver Stress - Reports that a close friend was recently diagnosed with cancer and she has become one of her caregivers. She has joined a caregiver's group and has been learning her boundaries.   Plantar Corns - Reports she's pretty certain that she has a plantar wart she noticed while walking barefoot-tenderness L foot.   Colonoscopy - Reports her colonoscopy resulted normal.   Health Maintenance Due  Topic Date Due   Medicare Annual Wellness (AWV)  04/13/2023   Past Medical History:  Diagnosis Date   Anemia    Arthritis    Cancer (HCC)    squamous cell cheek   Depression    Dysmenorrhea    Fibroid    Heart murmur    Hyperlipemia    Hyperlipidemia    Hypertension    Inverted nipple x several yrs   bilateral, pt states nipples have always been inverted   Joint pain    Obesity    Sleep apnea    SOB (shortness of breath)    Vitamin D deficiency    Past Surgical History:  Procedure Laterality Date   BLEPHAROPLASTY  Bilateral 02/21/2008   BREAST BIOPSY Right    BREAST EXCISIONAL BIOPSY Right    benign   COLONOSCOPY     PELVIC LAPAROSCOPY     TOTAL ABDOMINAL HYSTERECTOMY  05/04/2001   [place]so.  fibroids   Current Outpatient Medications:    aspirin EC 81 MG tablet, Take 81 mg by mouth daily., Disp: , Rfl:    Calcium Carbonate-Vit D-Min (CALCIUM 1200 PO), Take by mouth daily., Disp: , Rfl:    Cholecalciferol (VITAMIN D) 50 MCG (2000 UT) CAPS, Take by mouth., Disp: , Rfl:    ferrous sulfate 324 MG TBEC, Take 324 mg by mouth daily with breakfast., Disp: , Rfl:    Probiotic Product (PROBIOTIC-10 PO), Take by mouth., Disp: , Rfl:    hydrochlorothiazide (HYDRODIURIL) 12.5 MG tablet, Take 1 tablet (12.5 mg total) by mouth daily., Disp: 90 tablet, Rfl: 3   losartan (COZAAR) 100 MG tablet, Take 1 tablet (100 mg total) by mouth daily., Disp: 90 tablet, Rfl: 3   rosuvastatin (CRESTOR) 20 MG tablet, Take 1 tablet (20 mg total) by mouth daily., Disp: 90 tablet, Rfl: 3  No Known Allergies ROS neg/noncontributory except as noted HPI/below  Objective:  BP 128/68 (BP Location: Left Arm, Patient Position: Sitting, Cuff Size: Large)   Pulse 67   Temp 98.9 F (37.2 C) (Temporal)   Resp 18  Ht 5\' 4"  (1.626 m)   Wt 207 lb 4 oz (94 kg)   LMP 06/12/2000   SpO2 96%   BMI 35.57 kg/m  Wt Readings from Last 3 Encounters:  04/13/23 207 lb 4 oz (94 kg)  01/04/23 196 lb (88.9 kg)  01/03/23 193 lb (87.5 kg)   Physical Exam   Gen: WDWN NAD HEENT: NCAT, conjunctiva not injected, sclera nonicteric NECK:  supple, no thyromegaly, no nodes, no carotid bruits CARDIAC: RRR, S1S2+, no murmur. DP 2+B LUNGS: CTAB. No wheezes ABDOMEN:  BS+, soft, NTND, No HSM, no masses EXT:  trace chronic edema, +2 minor plantar corns base 5th L toe MSK: no gross abnormalities.  NEURO: A&O x3.  CN II-XII intact.  PSYCH: normal mood. Good eye contact Diabetic Foot Exam - Simple   Simple Foot Form Diabetic Foot exam was performed with  the following findings: Yes 04/13/2023 11:29 AM  Visual Inspection See comments: Yes Sensation Testing Intact to touch and monofilament testing bilaterally: Yes Pulse Check Posterior Tibialis and Dorsalis pulse intact bilaterally: Yes Comments 2 tiny corns plantar near base 5th toe    Assessment & Plan:  Essential hypertension Assessment & Plan: Chronic.  Controlled.  Continue hydrochloroTHIAZIDE 12.5 mg and losartan 100 mg.    Orders: -     Comprehensive metabolic panel -     Microalbumin / creatinine urine ratio -     hydroCHLOROthiazide; Take 1 tablet (12.5 mg total) by mouth daily.  Dispense: 90 tablet; Refill: 3 -     Losartan Potassium; Take 1 tablet (100 mg total) by mouth daily.  Dispense: 90 tablet; Refill: 3  Pure hypercholesterolemia Assessment & Plan: Chronic.  Controlled.  Continue crestor 20mg   Orders: -     Comprehensive metabolic panel -     Lipid panel -     Rosuvastatin Calcium; Take 1 tablet (20 mg total) by mouth daily.  Dispense: 90 tablet; Refill: 3  Prediabetes Assessment & Plan: Chronic.  Controlled.  Continue diet/exercise  Orders: -     Comprehensive metabolic panel -     Hemoglobin A1c -     Microalbumin / creatinine urine ratio  Low serum iron Assessment & Plan: Chronic.  Probably due to regular blood donation.  She had stopped donating blood for a while and was taking iron regularly.  Donated fairly recently.  Will check iron levels etc.  Colonoscopy was negative  Orders: -     CBC with Differential/Platelet -     IBC + Ferritin -     Vitamin B12  Corn of foot  Corns of L foot-discussed tx, referral to pod if interested.  Return in about 6 months (around 10/11/2023) for HTN, cholesterol, DM.     I,Emily Lagle,acting as a scribe for Angelena Sole, MD.,have documented all relevant documentation on the behalf of Angelena Sole, MD,as directed by  Angelena Sole, MD while in the presence of Angelena Sole, MD.  I, Angelena Sole, MD, have reviewed  all documentation for this visit. The documentation on 04/15/23 for the exam, diagnosis, procedures, and orders are all accurate and complete.   Angelena Sole, MD

## 2023-04-13 NOTE — Patient Instructions (Signed)

## 2023-04-15 DIAGNOSIS — E611 Iron deficiency: Secondary | ICD-10-CM | POA: Insufficient documentation

## 2023-04-15 NOTE — Assessment & Plan Note (Signed)
Chronic.  Controlled.  Continue hydrochloroTHIAZIDE 12.5 mg and losartan 100 mg.

## 2023-04-15 NOTE — Assessment & Plan Note (Signed)
Chronic.  Probably due to regular blood donation.  She had stopped donating blood for a while and was taking iron regularly.  Donated fairly recently.  Will check iron levels etc.  Colonoscopy was negative

## 2023-04-15 NOTE — Assessment & Plan Note (Signed)
Chronic.  Controlled.  Continue diet/exercise

## 2023-04-15 NOTE — Progress Notes (Signed)
Labs are great!  Keep up the great work Iron-stores low end of normal.  Can take the iron daily or every other day.

## 2023-04-15 NOTE — Assessment & Plan Note (Signed)
Chronic.  Controlled.  Continue crestor 20mg 

## 2023-04-20 ENCOUNTER — Other Ambulatory Visit: Payer: Self-pay | Admitting: Medical Genetics

## 2023-04-20 DIAGNOSIS — Z006 Encounter for examination for normal comparison and control in clinical research program: Secondary | ICD-10-CM

## 2023-05-03 ENCOUNTER — Ambulatory Visit (INDEPENDENT_AMBULATORY_CARE_PROVIDER_SITE_OTHER): Payer: Medicare HMO | Admitting: Family Medicine

## 2023-05-16 ENCOUNTER — Other Ambulatory Visit (HOSPITAL_COMMUNITY)
Admission: RE | Admit: 2023-05-16 | Discharge: 2023-05-16 | Disposition: A | Discharge status: Home / Self Care | Payer: Self-pay | Source: Ambulatory Visit | Attending: Medical Genetics | Admitting: Medical Genetics

## 2023-05-16 DIAGNOSIS — Z006 Encounter for examination for normal comparison and control in clinical research program: Secondary | ICD-10-CM | POA: Insufficient documentation

## 2023-05-29 LAB — GENECONNECT MOLECULAR SCREEN: Genetic Analysis Overall Interpretation: NEGATIVE

## 2023-06-01 ENCOUNTER — Ambulatory Visit
Admission: RE | Admit: 2023-06-01 | Discharge: 2023-06-01 | Disposition: A | Discharge status: Home / Self Care | Payer: Medicare HMO | Source: Ambulatory Visit | Attending: Obstetrics and Gynecology | Admitting: Obstetrics and Gynecology

## 2023-06-01 DIAGNOSIS — E2839 Other primary ovarian failure: Secondary | ICD-10-CM | POA: Diagnosis not present

## 2023-06-01 DIAGNOSIS — Z90722 Acquired absence of ovaries, bilateral: Secondary | ICD-10-CM | POA: Diagnosis not present

## 2023-06-01 DIAGNOSIS — M8588 Other specified disorders of bone density and structure, other site: Secondary | ICD-10-CM | POA: Diagnosis not present

## 2023-06-01 DIAGNOSIS — M8580 Other specified disorders of bone density and structure, unspecified site: Secondary | ICD-10-CM

## 2023-06-07 ENCOUNTER — Ambulatory Visit (INDEPENDENT_AMBULATORY_CARE_PROVIDER_SITE_OTHER): Payer: Medicare HMO | Admitting: Family Medicine

## 2023-06-07 ENCOUNTER — Encounter (INDEPENDENT_AMBULATORY_CARE_PROVIDER_SITE_OTHER): Payer: Self-pay | Admitting: Family Medicine

## 2023-06-07 VITALS — BP 145/72 | HR 85 | Temp 98.1°F | Ht 64.0 in | Wt 203.0 lb

## 2023-06-07 DIAGNOSIS — E119 Type 2 diabetes mellitus without complications: Secondary | ICD-10-CM | POA: Diagnosis not present

## 2023-06-07 DIAGNOSIS — I10 Essential (primary) hypertension: Secondary | ICD-10-CM | POA: Diagnosis not present

## 2023-06-07 DIAGNOSIS — Z6834 Body mass index (BMI) 34.0-34.9, adult: Secondary | ICD-10-CM | POA: Diagnosis not present

## 2023-06-07 DIAGNOSIS — F439 Reaction to severe stress, unspecified: Secondary | ICD-10-CM

## 2023-06-07 DIAGNOSIS — E669 Obesity, unspecified: Secondary | ICD-10-CM

## 2023-06-07 NOTE — Progress Notes (Signed)
.smr  Office: [phone]  /  Fax: [phone]  WEIGHT SUMMARY AND BIOMETRICS  Anthropometric Measurements Height: 5\' 4"  (1.626 m) Weight: 203 lb (92.1 kg) BMI (Calculated): 34.83 Weight at Last Visit: 193 lb Weight Lost Since Last Visit: 0 Weight Gained Since Last Visit: 10 lb Total Weight Loss (lbs): 23 lb (10.4 kg)   Body Composition  Body Fat %: 43.6 % Fat Mass (lbs): 88.6 lbs Muscle Mass (lbs): 108.8 lbs Total Body Water (lbs): 78.6 lbs Visceral Fat Rating : 14   Other Clinical Data Fasting: Yes Labs: No Today's Visit #: 28    Chief Complaint: OBESITY   History of Present Illness   The patient, with a history of hypertension, type 2 diabetes, and obesity, presents for a routine follow-up. She reports a significant lifestyle change, having recently moved into a planned community where meals are provided. This change has led to a lack of control over her diet, resulting in a weight gain of 10 pounds over the past five months. Despite this, she has increased her physical activity, walking her dog 2-5 times a week and walking to meals within the community.  The patient acknowledges some difficulty adjusting to the social dynamics of the community, particularly around meal times. She has been eating alone, which has caused some discomfort among her neighbors. This has led to an increase in takeout meals from the dining room, contributing to her weight gain. She also admits to struggling with turning down dessert and has been consuming a lot of sandwiches for lunch.  In addition to these lifestyle changes, the patient is dealing with significant stress related to the health care of a friend with Parkinson's disease. She has taken on a large responsibility in managing her friend's medical care, which has included emergency room visits and acting as her friend's memory due to cognitive issues associated with Parkinson's. The patient is attending a support group to help manage  this stress.  Despite these challenges, the patient's blood pressure readings have been within normal range, and her A1c has improved from 6.5 to 5.3, indicating good control of her type 2 diabetes. She expresses a desire to regain control over her diet and is considering resurrecting a previous meal tracking spreadsheet to help with this.          PHYSICAL EXAM:  Blood pressure (!) 145/72, pulse 85, temperature 98.1 F (36.7 C), height 5\' 4"  (1.626 m), weight 203 lb (92.1 kg), last menstrual period 06/12/2000, SpO2 96%. Body mass index is 34.84 kg/m.  DIAGNOSTIC DATA REVIEWED:  BMET    Component Value Date/Time   NA 140 04/13/2023 1136   NA 140 01/03/2023 1131   K 4.1 04/13/2023 1136   CL 105 04/13/2023 1136   CO2 29 04/13/2023 1136   GLUCOSE 96 04/13/2023 1136   BUN 18 04/13/2023 1136   BUN 15 01/03/2023 1131   CREATININE 0.74 04/13/2023 1136   CALCIUM 9.5 04/13/2023 1136   GFRNONAA 86 06/22/2020 0825   GFRAA 100 06/22/2020 0825   Lab Results  Component Value Date   HGBA1C 5.3 04/13/2023   HGBA1C 6.5 10/31/2019   Lab Results  Component Value Date   INSULIN 10.7 01/03/2023   INSULIN 24.9 12/09/2019   Lab Results  Component Value Date   TSH 2.19 10/11/2022   CBC    Component Value Date/Time   WBC 6.8 04/13/2023 1136   RBC 4.46 04/13/2023 1136   HGB 13.4 04/13/2023 1136   HCT 41.6 04/13/2023 1136  PLT 168.0 04/13/2023 1136   MCV 93.4 04/13/2023 1136   MCH 30.3 07/30/2020 1107   MCHC 32.1 04/13/2023 1136   RDW 13.4 04/13/2023 1136   Iron Studies    Component Value Date/Time   IRON 131 04/13/2023 1136   TIBC 413.0 04/13/2023 1136   FERRITIN 12.8 04/13/2023 1136   IRONPCTSAT 31.7 04/13/2023 1136   IRONPCTSAT 24 11/27/2022 1414   Lipid Panel     Component Value Date/Time   CHOL 161 04/13/2023 1136   CHOL 146 01/03/2023 1131   TRIG 71.0 04/13/2023 1136   HDL 73.10 04/13/2023 1136   HDL 77 01/03/2023 1131   CHOLHDL 2 04/13/2023 1136   VLDL 14.2  04/13/2023 1136   LDLCALC 74 04/13/2023 1136   LDLCALC 55 01/03/2023 1131   Hepatic Function Panel     Component Value Date/Time   PROT 6.8 04/13/2023 1136   PROT 6.8 01/03/2023 1131   ALBUMIN 4.3 04/13/2023 1136   ALBUMIN 4.5 01/03/2023 1131   AST 25 04/13/2023 1136   ALT 29 04/13/2023 1136   ALKPHOS 55 04/13/2023 1136   BILITOT 0.6 04/13/2023 1136   BILITOT 0.5 01/03/2023 1131   BILIDIR 0.1 04/12/2022 0849      Component Value Date/Time   TSH 2.19 10/11/2022 0820   Nutritional Lab Results  Component Value Date   VD25OH 59.0 01/03/2023   VD25OH 49.9 07/05/2022   VD25OH 55.7 12/27/2021     Assessment and Plan    Hypertension Currently managed with losartan and hydrochlorothiazide. Initial BP was 145/72, repeat was 127/79. Initial elevation likely due to early measurement. - Continue losartan and hydrochlorothiazide - Monitor BP regularly  Type 2 Diabetes Well-controlled with diet. A1c improved from 6.5 to 5.3. - Continue dietary management - Monitor A1c levels regularly  Obesity Gained 10 pounds in five months since moving to a planned community. Walking 25 minutes three times per week. Struggling with dietary control, particularly desserts and stress-related eating. Discussed consulting Dr. Dewaine Conger, bariatric psychologist. Prefers not to start medications like Ozempic. - Follow structured eating plan using a spreadsheet - Consider appointment with Dr. Dewaine Conger - Increase protein intake - Continue walking for exercise  Stress Significant stress related to caregiving for a friend with Parkinson's disease and cognitive issues. Attending a support group, which has been helpful. Discussed setting boundaries for caregiving responsibilities. - Continue attending support group - Set boundaries for caregiving responsibilities -offered an appointment with Dr Dewaine Conger if she desires  Follow-up - Follow up in six months - Call if any issues arise before the next visit.        She was informed of the importance of frequent follow up visits to maximize her success with intensive lifestyle modifications for her multiple health conditions.    Quillian Quince, MD

## 2023-06-18 DIAGNOSIS — Z961 Presence of intraocular lens: Secondary | ICD-10-CM | POA: Diagnosis not present

## 2023-06-18 DIAGNOSIS — H43813 Vitreous degeneration, bilateral: Secondary | ICD-10-CM | POA: Diagnosis not present

## 2023-06-18 DIAGNOSIS — H26493 Other secondary cataract, bilateral: Secondary | ICD-10-CM | POA: Diagnosis not present

## 2023-06-18 DIAGNOSIS — H35033 Hypertensive retinopathy, bilateral: Secondary | ICD-10-CM | POA: Diagnosis not present

## 2023-06-18 LAB — HM DIABETES EYE EXAM

## 2023-06-22 ENCOUNTER — Ambulatory Visit
Admission: RE | Admit: 2023-06-22 | Discharge: 2023-06-22 | Disposition: A | Discharge status: Home / Self Care | Payer: Medicare HMO | Source: Ambulatory Visit | Attending: Physician Assistant

## 2023-06-22 VITALS — BP 167/80 | HR 79 | Temp 98.9°F | Resp 18

## 2023-06-22 DIAGNOSIS — R051 Acute cough: Secondary | ICD-10-CM

## 2023-06-22 LAB — POC COVID19/FLU A&B COMBO
Covid Antigen, POC: NEGATIVE
Influenza A Antigen, POC: NEGATIVE
Influenza B Antigen, POC: NEGATIVE

## 2023-06-22 NOTE — ED Triage Notes (Signed)
 Pt presents with c/o a cough that has progressively gotten worse x 3 days.   Pt states she is concerned of Bronchitis

## 2023-06-22 NOTE — ED Provider Notes (Signed)
 [facility]    CSN: [id number] Arrival date & time: 06/22/23  0945      History   Chief Complaint Chief Complaint  Patient presents with   Wheezing    Cough and possible bronchitis. - Entered by patient    HPI Tina Sutton is a 73 y.o. female.   Patient here concerned with cough x 2 - 3 days  cough semi productive, worse at night / am.  Denies f/c, URI sx, sore throat, n/v/d, wheezing, SOB.  No advil or tylenol  today.  She hasn't taken anything for this so far.      Past Medical History:  Diagnosis Date   Anemia    Arthritis    Cancer (HCC)    squamous cell cheek   Depression    Dysmenorrhea    Fibroid    Heart murmur    Hyperlipemia    Hyperlipidemia    Hypertension    Inverted nipple x several yrs   bilateral, pt states nipples have always been inverted   Joint pain    Obesity    Sleep apnea    SOB (shortness of breath)    Vitamin D  deficiency     Patient Active Problem List   Diagnosis Date Noted   Stress 06/07/2023   Low serum iron 04/15/2023   Prediabetes 10/11/2022   BMI 34.0-34.9,adult 07/05/2022   Obesity, Beginning BMI 38.79 07/05/2022   Diabetes mellitus (HCC) 04/04/2022   Hypertensive retinopathy 08/31/2021   Chronic right hip pain 11/15/2020   Allergic rhinitis 08/30/2020   Chronic kidney disease due to hypertension 08/30/2020   History of malignant neoplasm of skin 08/30/2020   Hyperlipidemia 08/30/2020   Malignant melanoma of face (HCC) 08/30/2020   Osteopenia 08/30/2020   History of colonic polyps 08/30/2020   Vitamin D  deficiency 08/30/2020   Essential hypertension 03/03/2020    Past Surgical History:  Procedure Laterality Date   BLEPHAROPLASTY Bilateral 02/21/2008   BREAST BIOPSY Right    BREAST EXCISIONAL BIOPSY Right    benign   COLONOSCOPY     PELVIC LAPAROSCOPY     TOTAL ABDOMINAL HYSTERECTOMY  05/04/2001   [place].  fibroids    OB History     Gravida  0   Para  0   Term  0   Preterm  0   AB  0    Living  0      SAB  0   IAB  0   Ectopic  0   Multiple  0   Live Births  0            Home Medications    Prior to Admission medications   Medication Sig Start Date End Date Taking? Authorizing Provider  aspirin EC 81 MG tablet Take 81 mg by mouth daily.    [provider]  Calcium  Carbonate-Vit D-Min (CALCIUM  1200 PO) Take by mouth daily.    [provider]  Cholecalciferol (VITAMIN D ) 50 MCG (2000 UT) CAPS Take by mouth.    [provider]  ferrous sulfate 324 MG TBEC Take 324 mg by mouth daily with breakfast.    [provider]  hydrochlorothiazide  (HYDRODIURIL ) 12.5 MG tablet Take 1 tablet (12.5 mg total) by mouth daily. 04/13/23   Wendolyn Jenkins Jansky, MD  losartan  (COZAAR ) 100 MG tablet Take 1 tablet (100 mg total) by mouth daily. 04/13/23   Wendolyn Jenkins Jansky, MD  Probiotic Product (PROBIOTIC-10 PO) Take by mouth.    [provider]  rosuvastatin  (CRESTOR ) 20 MG tablet Take 1 tablet (20 mg total) by mouth daily. 04/13/23   Wendolyn Jenkins Jansky, MD    Family History Family History  Problem Relation Age of Onset   Hypertension Mother    Alzheimer's disease Mother    Heart disease Mother    Hyperlipidemia Mother    Depression Mother    Obesity Mother    Hypertension Father    Diabetes Father    Stroke Father    Obesity Father    Colon polyps Father    Breast cancer Sister    Hypertension Sister    Breast cancer Sister    Hypertension Sister    Hypertension Sister    Esophageal cancer Neg Hx    Liver disease Neg Hx    Colon cancer Neg Hx    Stomach cancer Neg Hx    Rectal cancer Neg Hx     Social History Social History   Tobacco Use   Smoking status: Never   Smokeless tobacco: Never  Vaping Use   Vaping status: Never Used  Substance Use Topics   Alcohol use: Yes    Alcohol/week: 1.0 - 5.0 standard drink of alcohol    Types: 1 - 5 Glasses of wine per week   Drug use: No     Allergies   Patient has no  known allergies.   Review of Systems Review of Systems  Constitutional:  Negative for chills, fatigue and fever.  HENT:  Negative for congestion, ear pain, nosebleeds, postnasal drip, rhinorrhea, sinus pressure, sinus pain and sore throat.   Eyes:  Negative for pain and redness.  Respiratory:  Positive for cough. Negative for shortness of breath and wheezing.   Gastrointestinal:  Negative for abdominal pain, diarrhea, nausea and vomiting.  Musculoskeletal:  Negative for arthralgias and myalgias.  Skin:  Negative for rash.  Neurological:  Negative for light-headedness and headaches.  Hematological:  Negative for adenopathy. Does not bruise/bleed easily.  Psychiatric/Behavioral:  Negative for confusion and sleep disturbance.      Physical Exam Triage Vital Signs ED Triage Vitals  Encounter Vitals Group     BP 06/22/23 1002 (!) 167/80     Systolic BP Percentile --      Diastolic BP Percentile --      Pulse Rate 06/22/23 1002 79     Resp 06/22/23 1002 18     Temp 06/22/23 1002 98.9 F (37.2 C)     Temp Source 06/22/23 1002 Oral     SpO2 06/22/23 1002 98 %     Weight --      Height --      Head Circumference --      Peak Flow --      Pain Score 06/22/23 1001 0     Pain Loc --      Pain Education --      Exclude from Growth Chart --    No data found.  Updated Vital Signs BP (!) 167/80 (BP Location: Right Arm)   Pulse 79   Temp 98.9 F (37.2 C) (Oral)   Resp 18   LMP 06/12/2000   SpO2 98%   Visual Acuity Right Eye Distance:   Left Eye Distance:   Bilateral Distance:    Right Eye Near:   Left Eye Near:    Bilateral Near:     Physical Exam Vitals and nursing note reviewed.  Constitutional:      General: She is not in acute distress.  Appearance: Normal appearance. She is not ill-appearing.  HENT:     Head: Normocephalic and atraumatic.     Right Ear: Tympanic membrane and ear canal normal.     Left Ear: Tympanic membrane and ear canal normal.     Nose: No  congestion or rhinorrhea.     Mouth/Throat:     Pharynx: No oropharyngeal exudate or posterior oropharyngeal erythema.  Eyes:     General: No scleral icterus.    Extraocular Movements: Extraocular movements intact.     Conjunctiva/sclera: Conjunctivae normal.  Cardiovascular:     Rate and Rhythm: Normal rate and regular rhythm.     Heart sounds: No murmur heard. Pulmonary:     Effort: Pulmonary effort is normal. No respiratory distress.     Breath sounds: Normal breath sounds. No wheezing or rales.  Musculoskeletal:     Cervical back: Normal range of motion. No rigidity.  Lymphadenopathy:     Cervical: No cervical adenopathy.  Skin:    Capillary Refill: Capillary refill takes less than 2 seconds.     Coloration: Skin is not jaundiced.     Findings: No rash.  Neurological:     General: No focal deficit present.     Mental Status: She is alert and oriented to person, place, and time.     Motor: No weakness.     Gait: Gait normal.  Psychiatric:        Mood and Affect: Mood normal.        Behavior: Behavior normal.      UC Treatments / Results  Labs (all labs ordered are listed, but only abnormal results are displayed) Labs Reviewed  POC COVID19/FLU A&B COMBO    EKG   Radiology No results found.  Procedures Procedures (including critical care time)  Medications Ordered in UC Medications - No data to display  Initial Impression / Assessment and Plan / UC Course  I have reviewed the triage vital signs and the nursing notes.  Pertinent labs & imaging results that were available during my care of the patient were reviewed by me and considered in my medical decision making (see chart for details).     Pneumonia unlikely - patient in NAD, VSS, no SOB/Fever Recommend OTC cough medication Proper hand hygiene Cover mouth when coughing Final Clinical Impressions(s) / UC Diagnoses   Final diagnoses:  Acute cough     Discharge Instructions      Follow up with  PCP Return if symptoms worsen or fail to improve over the next 1 - 2 weeks     ED Prescriptions   None    PDMP not reviewed this encounter.   Juleen Rush, PA-C 06/22/23 1041

## 2023-06-22 NOTE — Discharge Instructions (Addendum)
 Follow up with PCP Return if symptoms worsen or fail to improve over the next 1 - 2 weeks

## 2023-06-28 ENCOUNTER — Ambulatory Visit: Payer: Medicare HMO | Admitting: Family

## 2023-06-28 VITALS — BP 151/80 | HR 79 | Temp 98.0°F | Ht 64.0 in | Wt 207.0 lb

## 2023-06-28 DIAGNOSIS — R053 Chronic cough: Secondary | ICD-10-CM

## 2023-06-28 MED ORDER — PREDNISONE 20 MG PO TABS: ORAL_TABLET | ORAL | 0 refills | Status: DC

## 2023-06-28 NOTE — Progress Notes (Signed)
Patient ID: Tina Sutton, female    DOB: February 16, 1951, 73 y.o.   MRN: [id number]  Chief Complaint  Patient presents with   Cough    Pt c/o Cough and nasal congestion for 9 days, Nasal congestion resolved. Has tried Cough/cold HBP and Mucinex sinus max which did not help sx. Pt states she coughed hard yesterday and vision started to blur but returned as normal.        Discussed the use of AI scribe software for clinical note transcription with the patient, who gave verbal consent to proceed.  History of Present Illness   The patient, with a history of hypertension and hyperlipidemia, presents with a worsening cough that started 9 days ago. The cough is sometimes productive with yellow to clear mucus. The patient also reports increased fatigue, having to rest multiple times during a regular walk with her dog, which is unusual for her. The patient visited urgent care a week ago due to concerns of bronchitis, but her lungs were clear upon examination. The patient tested negative for COVID and flu at the urgent care. The patient denies any history of asthma and has never used an inhaler. The patient is currently taking medications for hypertension, hyperlipidemia, and iron supplementation.     Assessment & Plan:     Upper Respiratory Infection - Persistent cough for over a week, sometimes productive with yellow to clear sputum. No fever, no chest tightness, no history of asthma. Fatigue and decreased exercise tolerance noted. Lungs clear on auscultation. -Start low-Sutton Prednisone pack for 5 days, starting tomorrow morning to help with inflammation and cough. -Continue hydration and use of humidifier at home. -Consider use of generic Nasacort for chronic runny nose, starting with bid for 3d, then qd until sx improved. -If symptoms persist or worsen, patient to contact clinic next week.  Hypertension - Blood pressure slightly elevated today, possibly due to current illness or wearing off of  Losartan towards end of day. -Continue current antihypertensive regimen (Losartan and Hydrochlorothiazide). -Monitor blood pressure, especially as Prednisone can cause elevation. -Hydrate with 2L water daily and eat a low sodium diet. -Discuss possible adjustment of Losartan dosing with Dr. Ruthine Sutton (PCP), if elevated BP persists.     Subjective:    Outpatient Medications Prior to Visit  Medication Sig Dispense Refill   aspirin EC 81 MG tablet Take 81 mg by mouth daily.     Calcium Carbonate-Vit D-Min (CALCIUM 1200 PO) Take by mouth daily.     Cholecalciferol (VITAMIN D) 50 MCG (2000 UT) CAPS Take by mouth.     ferrous sulfate 324 MG TBEC Take 324 mg by mouth daily with breakfast.     hydrochlorothiazide (HYDRODIURIL) 12.5 MG tablet Take 1 tablet (12.5 mg total) by mouth daily. 90 tablet 3   losartan (COZAAR) 100 MG tablet Take 1 tablet (100 mg total) by mouth daily. 90 tablet 3   Probiotic Product (PROBIOTIC-10 PO) Take by mouth.     rosuvastatin (CRESTOR) 20 MG tablet Take 1 tablet (20 mg total) by mouth daily. 90 tablet 3   No facility-administered medications prior to visit.   Past Medical History:  Diagnosis Date   Anemia    Arthritis    Cancer (HCC)    squamous cell cheek   Depression    Dysmenorrhea    Fibroid    Heart murmur    Hyperlipemia    Hyperlipidemia    Hypertension    Inverted nipple x several yrs   bilateral, pt states  nipples have always been inverted   Joint pain    Obesity    Sleep apnea    SOB (shortness of breath)    Vitamin D deficiency    Past Surgical History:  Procedure Laterality Date   BLEPHAROPLASTY Bilateral 02/21/2008   BREAST BIOPSY Right    BREAST EXCISIONAL BIOPSY Right    benign   COLONOSCOPY     PELVIC LAPAROSCOPY     TOTAL ABDOMINAL HYSTERECTOMY  05/04/2001   [place] [facility].  fibroids   No Known Allergies    Objective:    Physical Exam Vitals and nursing note reviewed.  Constitutional:      Appearance: Normal appearance.   Cardiovascular:     Rate and Rhythm: Normal rate and regular rhythm.  Pulmonary:     Effort: Pulmonary effort is normal.     Breath sounds: Normal breath sounds.  Musculoskeletal:        General: Normal range of motion.  Skin:    General: Skin is warm and dry.  Neurological:     Mental Status: She is alert.  Psychiatric:        Mood and Affect: Mood normal.        Behavior: Behavior normal.    Temp 98 F (36.7 C) (Temporal)   Ht 5\' 4"  (1.626 m)   Wt 207 lb (93.9 kg)   LMP 06/12/2000   BMI 35.53 kg/m  Wt Readings from Last 3 Encounters:  06/28/23 207 lb (93.9 kg)  06/07/23 203 lb (92.1 kg)  04/13/23 207 lb 4 oz (94 kg)       Tina Sellar, NP

## 2023-08-08 ENCOUNTER — Other Ambulatory Visit: Payer: Self-pay | Admitting: Obstetrics and Gynecology

## 2023-08-08 DIAGNOSIS — Z1231 Encounter for screening mammogram for malignant neoplasm of breast: Secondary | ICD-10-CM

## 2023-08-09 ENCOUNTER — Ambulatory Visit
Admission: RE | Admit: 2023-08-09 | Discharge: 2023-08-09 | Disposition: A | Discharge status: Home / Self Care | Payer: Medicare HMO | Source: Ambulatory Visit | Attending: Obstetrics and Gynecology | Admitting: Obstetrics and Gynecology

## 2023-08-09 DIAGNOSIS — Z1231 Encounter for screening mammogram for malignant neoplasm of breast: Secondary | ICD-10-CM | POA: Diagnosis not present

## 2023-08-14 ENCOUNTER — Encounter: Payer: Self-pay | Admitting: Obstetrics and Gynecology

## 2023-09-03 ENCOUNTER — Encounter: Payer: Self-pay | Admitting: Family Medicine

## 2023-09-03 ENCOUNTER — Other Ambulatory Visit: Payer: Self-pay | Admitting: *Deleted

## 2023-09-03 DIAGNOSIS — I10 Essential (primary) hypertension: Secondary | ICD-10-CM

## 2023-09-03 MED ORDER — HYDROCHLOROTHIAZIDE 12.5 MG PO TABS: 12.5000 mg | ORAL_TABLET | Freq: Every day | ORAL | 3 refills | Status: AC

## 2023-09-03 MED ORDER — LOSARTAN POTASSIUM 100 MG PO TABS: 100.0000 mg | ORAL_TABLET | Freq: Every day | ORAL | 3 refills | Status: AC

## 2023-10-10 DIAGNOSIS — L304 Erythema intertrigo: Secondary | ICD-10-CM | POA: Diagnosis not present

## 2023-10-10 DIAGNOSIS — L570 Actinic keratosis: Secondary | ICD-10-CM | POA: Diagnosis not present

## 2023-10-10 DIAGNOSIS — D225 Melanocytic nevi of trunk: Secondary | ICD-10-CM | POA: Diagnosis not present

## 2023-10-10 DIAGNOSIS — L814 Other melanin hyperpigmentation: Secondary | ICD-10-CM | POA: Diagnosis not present

## 2023-10-10 DIAGNOSIS — L821 Other seborrheic keratosis: Secondary | ICD-10-CM | POA: Diagnosis not present

## 2023-10-10 DIAGNOSIS — D1801 Hemangioma of skin and subcutaneous tissue: Secondary | ICD-10-CM | POA: Diagnosis not present

## 2023-10-10 DIAGNOSIS — Z85828 Personal history of other malignant neoplasm of skin: Secondary | ICD-10-CM | POA: Diagnosis not present

## 2023-10-22 ENCOUNTER — Encounter (HOSPITAL_BASED_OUTPATIENT_CLINIC_OR_DEPARTMENT_OTHER): Payer: Self-pay | Admitting: Cardiology

## 2023-10-22 ENCOUNTER — Ambulatory Visit (HOSPITAL_BASED_OUTPATIENT_CLINIC_OR_DEPARTMENT_OTHER): Payer: Medicare HMO | Admitting: Cardiology

## 2023-10-22 VITALS — BP 136/76 | HR 72 | Ht 64.0 in | Wt 210.2 lb

## 2023-10-22 DIAGNOSIS — R011 Cardiac murmur, unspecified: Secondary | ICD-10-CM | POA: Diagnosis not present

## 2023-10-22 DIAGNOSIS — R0789 Other chest pain: Secondary | ICD-10-CM | POA: Diagnosis not present

## 2023-10-22 DIAGNOSIS — Z8249 Family history of ischemic heart disease and other diseases of the circulatory system: Secondary | ICD-10-CM

## 2023-10-22 DIAGNOSIS — I10 Essential (primary) hypertension: Secondary | ICD-10-CM

## 2023-10-22 DIAGNOSIS — I2510 Atherosclerotic heart disease of native coronary artery without angina pectoris: Secondary | ICD-10-CM | POA: Diagnosis not present

## 2023-10-22 DIAGNOSIS — Z7189 Other specified counseling: Secondary | ICD-10-CM | POA: Diagnosis not present

## 2023-10-22 NOTE — Progress Notes (Signed)
 Cardiology Office Note:  .   Date:  10/22/2023  ID:  Clemmie Dejong, DOB 04/05/1951, MRN [id number] PCP: Christel Cousins, MD  [facility] Providers Cardiologist:  Sheryle Donning, MD {  History of Present Illness: .   Tina Sutton is a 73 y.o. female with PMH hypertension, hyperlipidemia, CAD by calcium  score. I met her 01/03/22.  Pertinent CV history: Mother with CAD and CABG x4/valve replacement age 30. Hypertension since her 10s. Ca score 1096 (97%ile) in 2023. ETT unremarkable.  Today: Had two episodes of January of indigestion when she went to bed. Has had one episode since then. Feels like a burning, hasn't tried anything when it occurs. No associated symptoms. Self limited. Nonexertional.   Active intermittently. Sometimes goes dancing, notes that her heart rate goes up with this. No routine stairs that she has to take, but if she does take stairs, feels winded at that top.  Has her BP checked intermittently at her retirement community, has been well controlled.  ROS: Denies chest pain other than as above, shortness of breath at rest or with normal exertion. No PND, orthopnea, LE edema or unexpected weight gain. No syncope or palpitations. ROS otherwise negative except as noted.   Studies Reviewed: Aaron Aas    EKG:  EKG Interpretation Date/Time:  Monday Oct 22 2023 10:07:57 EDT Ventricular Rate:  75 PR Interval:  202 QRS Duration:  86 QT Interval:  392 QTC Calculation: 437 R Axis:   17  Text Interpretation: Normal sinus rhythm Normal ECG Confirmed by Sheryle Donning [phone]) on 10/22/2023 10:36:30 AM    Physical Exam:   VS:  BP 136/76   Pulse 72   Ht 5\' 4"  (1.626 m)   Wt 210 lb 3.2 oz (95.3 kg)   LMP 06/12/2000   SpO2 96%   BMI 36.08 kg/m    Wt Readings from Last 3 Encounters:  10/22/23 210 lb 3.2 oz (95.3 kg)  06/28/23 207 lb (93.9 kg)  06/07/23 203 lb (92.1 kg)    GEN: Well nourished, well developed in no acute distress HEENT: Normal, moist  mucous membranes NECK: No JVD CARDIAC: regular rhythm, normal S1 and S2, no rubs or gallops. 2/6 aortic, 1/6 mitral murmur. VASCULAR: Radial and DP pulses 2+ bilaterally. No carotid bruits RESPIRATORY:  Clear to auscultation without rales, wheezing or rhonchi  ABDOMEN: Soft, non-tender, non-distended MUSCULOSKELETAL:  Ambulates independently SKIN: Warm and dry, no edema NEUROLOGIC:  Alert and oriented x 3. No focal neuro deficits noted. PSYCHIATRIC:  Normal affect    ASSESSMENT AND PLAN: .    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 Reviewed ways to manage this, signs/symptoms to watch for  Prior obesity, prior type II diabetes -much improved with weight loss through [facility], has used Ozempic  with good results   Murmurs -mild aortic stenosis, mild MR on echo 2019 -reviewed symptoms to watch for  Hypertension -continue hydrochlorothiazide , losartan   CV risk counseling and prevention -recommend heart healthy/Mediterranean diet, with whole grains, fruits, vegetable, fish, lean meats, nuts, and olive oil. Limit salt. -recommend moderate walking, 3-5 times/week for 30-50 minutes each session. Aim for at least 150 minutes.week. Goal should be pace of 3 miles/hours, or walking 1.5 miles in 30 minutes -recommend avoidance of tobacco products. Avoid excess alcohol  Dispo: 1 year or sooner as needed  Signed, Sheryle Donning, MD   Sheryle Donning,  MD, PhD, [facility] [place]  [facility]  [place]  [facility] at [facility] at [facility] [address] [place], [place] [address] [phone]

## 2023-10-22 NOTE — Patient Instructions (Signed)
 Medication Instructions:  Your physician recommends that you continue on your current medications as directed. Please refer to the Current Medication list given to you today.   Follow-Up: Please follow up in 12 months with Dr. Veryl Gottron, Slater Duncan, NP or Neomi Banks, NP

## 2023-10-28 ENCOUNTER — Ambulatory Visit
Admission: EM | Admit: 2023-10-28 | Discharge: 2023-10-28 | Disposition: A | Discharge status: Home / Self Care | Attending: Family Medicine | Admitting: Family Medicine

## 2023-10-28 DIAGNOSIS — H00033 Abscess of eyelid right eye, unspecified eyelid: Secondary | ICD-10-CM | POA: Diagnosis not present

## 2023-10-28 MED ORDER — ERYTHROMYCIN 5 MG/GM OP OINT: TOPICAL_OINTMENT | OPHTHALMIC | 0 refills | Status: DC

## 2023-10-28 MED ORDER — AMOXICILLIN-POT CLAVULANATE 875-125 MG PO TABS: 1.0000 | ORAL_TABLET | Freq: Two times a day (BID) | ORAL | 0 refills | Status: DC

## 2023-10-28 NOTE — Discharge Instructions (Addendum)
 Start Augmentin twice daily for 10 days.  Start erythromycin topical antibiotic ointment 3 times a day to the right upper eyelid for 7 days.  I also recommend starting over-the-counter allergy medicine such as Claritin or Zyrtec daily for at least 7 days.  Continue cool compresses as needed.  Please follow-up with your eye doctor in 1 to 2 days for recheck.  Please go to the ER if you develop any worsening symptoms.  This includes but is not limited to worsening redness or swelling of your eyelid, fevers or chills, or any new concerns that arise.  Hope you feel better soon!

## 2023-10-28 NOTE — ED Triage Notes (Addendum)
 Pt present with lt eye redness, swelling and itching x yesterday morning. Pt states the selling has not affected her vision

## 2023-10-28 NOTE — ED Provider Notes (Signed)
 [facility]    CSN: [id number] Arrival date & time: 10/28/23  0803      History   Chief Complaint Chief Complaint  Patient presents with   Eye Problem    HPI Tina Sutton is a 73 y.o. female presents for eyelid swelling.  Patient reports yesterday she awoke with right upper eyelid swelling that is worsened.  States it is warm and red and if she applies cool compresses it makes it feel itchy.  Denies any drainage or injury to the area.  Denies any conjunctiva redness or drainage.  Denies any allergy or URI symptoms.  No fevers or chills.  She tried a eye ointment without improvement.  No other concerns at this time.   Eye Problem   Past Medical History:  Diagnosis Date   Anemia    Arthritis    Cancer (HCC)    squamous cell cheek   Depression    Dysmenorrhea    Fibroid    Heart murmur    Hyperlipemia    Hyperlipidemia    Hypertension    Inverted nipple x several yrs   bilateral, pt states nipples have always been inverted   Joint pain    Obesity    Sleep apnea    SOB (shortness of breath)    Vitamin D  deficiency     Patient Active Problem List   Diagnosis Date Noted   Stress 06/07/2023   Low serum iron 04/15/2023   Prediabetes 10/11/2022   BMI 34.0-34.9,adult 07/05/2022   Obesity, Beginning BMI 38.79 07/05/2022   Diabetes mellitus (HCC) 04/04/2022   Hypertensive retinopathy 08/31/2021   Chronic right hip pain 11/15/2020   Allergic rhinitis 08/30/2020   Chronic kidney disease due to hypertension 08/30/2020   History of malignant neoplasm of skin 08/30/2020   Hyperlipidemia 08/30/2020   Malignant melanoma of face (HCC) 08/30/2020   Osteopenia 08/30/2020   History of colonic polyps 08/30/2020   Vitamin D  deficiency 08/30/2020   Essential hypertension 03/03/2020    Past Surgical History:  Procedure Laterality Date   BLEPHAROPLASTY Bilateral 02/21/2008   BREAST BIOPSY Right    BREAST EXCISIONAL BIOPSY Right    benign   COLONOSCOPY      PELVIC LAPAROSCOPY     TOTAL ABDOMINAL HYSTERECTOMY  05/04/2001   [place] [facility].  fibroids    OB History     Gravida  0   Para  0   Term  0   Preterm  0   AB  0   Living  0      SAB  0   IAB  0   Ectopic  0   Multiple  0   Live Births  0            Home Medications    Prior to Admission medications   Medication Sig Start Date End Date Taking? Authorizing Provider  amoxicillin-clavulanate (AUGMENTIN) 875-125 MG tablet Take 1 tablet by mouth every 12 (twelve) hours for 10 days. 10/28/23 11/07/23 Yes Princess Karnes, Jodi R, NP  erythromycin ophthalmic ointment Place a 1/2 inch ribbon of ointment to the right upper eyelid three times a day for 7 days 10/28/23  Yes Shaan Rhoads, Jodi R, NP  aspirin EC 81 MG tablet Take 81 mg by mouth daily.    [provider]  Calcium  Carbonate-Vit D-Min (CALCIUM  1200 PO) Take by mouth daily.    [provider]  Cholecalciferol (VITAMIN D ) 50 MCG (2000 UT) CAPS Take by mouth.    [provider]  ferrous sulfate 324 MG TBEC Take 324 mg by mouth daily with breakfast.    [provider]  hydrochlorothiazide  (HYDRODIURIL ) 12.5 MG tablet Take 1 tablet (12.5 mg total) by mouth daily. 09/03/23   Christel Cousins, MD  losartan  (COZAAR ) 100 MG tablet Take 1 tablet (100 mg total) by mouth daily. 09/03/23   Christel Cousins, MD  predniSONE  (DELTASONE ) 20 MG tablet Take 2 pills in the morning with breakfast for 3 days, then 1 pill for 2 days Patient not taking: Reported on 10/22/2023 06/29/23   Versa Gore, NP  Probiotic Product (PROBIOTIC-10 PO) Take by mouth.    [provider]  rosuvastatin  (CRESTOR ) 20 MG tablet Take 1 tablet (20 mg total) by mouth daily. 04/13/23   Christel Cousins, MD    Family History Family History  Problem Relation Age of Onset   Hypertension Mother    Alzheimer's disease Mother    Heart disease Mother    Hyperlipidemia Mother    Depression Mother    Obesity Mother    Hypertension Father     Diabetes Father    Stroke Father    Obesity Father    Colon polyps Father    Breast cancer Sister    Hypertension Sister    Breast cancer Sister    Hypertension Sister    Hypertension Sister    Esophageal cancer Neg Hx    Liver disease Neg Hx    Colon cancer Neg Hx    Stomach cancer Neg Hx    Rectal cancer Neg Hx     Social History Social History   Tobacco Use   Smoking status: Never   Smokeless tobacco: Never  Vaping Use   Vaping status: Never Used  Substance Use Topics   Alcohol use: Yes    Alcohol/week: 1.0 - 5.0 standard drink of alcohol    Types: 1 - 5 Glasses of wine per week   Drug use: No     Allergies   Patient has no known allergies.   Review of Systems Review of Systems  Eyes:        Right upper eyelid swelling     Physical Exam Triage Vital Signs ED Triage Vitals [10/28/23 0813]  Encounter Vitals Group     BP 137/86     Systolic BP Percentile      Diastolic BP Percentile      Pulse Rate 87     Resp 16     Temp 98.1 F (36.7 C)     Temp Source Oral     SpO2 96 %     Weight      Height      Head Circumference      Peak Flow      Pain Score 0     Pain Loc      Pain Education      Exclude from Growth Chart    No data found.  Updated Vital Signs BP 137/86 (BP Location: Right Arm)   Pulse 87   Temp 98.1 F (36.7 C) (Oral)   Resp 16   LMP 06/12/2000   SpO2 96%   Visual Acuity Right Eye Distance:   Left Eye Distance:   Bilateral Distance:    Right Eye Near:   Left Eye Near:    Bilateral Near:     Physical Exam Vitals and nursing note reviewed.  Constitutional:      General: She is not in acute distress.  Appearance: Normal appearance. She is not ill-appearing.  HENT:     Head: Normocephalic and atraumatic.  Eyes:     Extraocular Movements: Extraocular movements intact.     Conjunctiva/sclera: Conjunctivae normal.     Pupils: Pupils are equal, round, and reactive to light.     Comments: There is moderate swelling  of the right upper eyelid that extends to the medial eye with erythema mild warmth.  Along the eyelid crease there appears to be some slight drainage/dry skin.  No stye.  See photo.  Cardiovascular:     Rate and Rhythm: Normal rate.  Pulmonary:     Effort: Pulmonary effort is normal.  Skin:    General: Skin is warm and dry.  Neurological:     General: No focal deficit present.     Mental Status: She is alert and oriented to person, place, and time.  Psychiatric:        Mood and Affect: Mood normal.        Behavior: Behavior normal.         UC Treatments / Results  Labs (all labs ordered are listed, but only abnormal results are displayed) Labs Reviewed - No data to display  Comprehensive metabolic panel Order: [id number]  Status: Final result     Next appt: 11/12/2023 at 08:00 AM in Gynecology Reinaldo Caras, MD)     Dx: Essential hypertension; Pure hypercho...   Test Result Released: Yes (seen)     Messages: Seen   2 Result Notes     1 Patient Communication     1 HM Topic          Component Ref Range & Units (hover) 6 mo ago (04/13/23) 9 mo ago (01/03/23) 1 yr ago (10/11/22) 1 yr ago (07/05/22) 1 yr ago (04/12/22) 1 yr ago (12/27/21) 1 yr ago (11/24/21)  Sodium 140 140 R 141 140 R  143 R 139  Potassium 4.1 4.4 R 4.0 4.5 R  4.7 R 3.8  Chloride 105 101 R 107 104 R  105 R 103  CO2 29 25 R 27 24 R  25 R 27  Glucose, Bld 96 85 106 High  104 High   96 91  BUN 18 15 R 18 19 R  13 R 17  Creatinine, Ser 0.74 0.67 R 0.68 0.76 R  0.74 R 0.83  Total Bilirubin 0.6 0.5 R 0.4 0.3 R 0.4 0.3 R 0.5  Alkaline Phosphatase 55 59 R 52 61 R 60 64 R 44  AST 25 37 R 26 19 R 27 26 R 16  ALT 29 45 High  R 28 20 R 29 33 High  R 16  Total Protein 6.8 6.8 R 6.2 6.6 R 7.1 6.4 R 6.9  Albumin 4.3 4.5 R 3.9 4.5 R 4.4 4.4 R, CM 4.3  GFR 81.29  87.82 CM    71.52 CM  Comment: Calculated using the CKD-EPI Creatinine Equation (2021)  Calcium  9.5 9.7 R 9.0 9.6 R  9.4 R 9.6  Resulting Agency  [place] [facility] [place] [facility] [place] [facility] [place] [facility]        Specimen Collected: 04/13/23 11:36 Last Resulted: 04/13/23 16:05    EKG   Radiology No results found.  Procedures Procedures (including critical care time)  Medications Ordered in UC Medications - No data to display  Initial Impression / Assessment and Plan / UC Course  I have reviewed the triage vital signs and the nursing notes.  Pertinent labs &  imaging results that were available during my care of the patient were reviewed by me and considered in my medical decision making (see chart for details).     Reviewed exam and symptoms with patient.  Concern for eyelid cellulitis.  Patient is afebrile and in no acute distress.  Will start Augmentin and topical erythromycin.  Also advised her to start over-the-counter allergy medicine such as Claritin and Zyrtec.  Continue cool compresses.  Instructed her to follow-up with her ophthalmologist in 1 to 2 days for recheck.  Strict ER precautions reviewed and patient verbalized understanding. Final Clinical Impressions(s) / UC Diagnoses   Final diagnoses:  Eyelid cellulitis, right   Discharge Instructions      Start Augmentin twice daily for 10 days.  Start erythromycin topical antibiotic ointment 3 times a day to the right upper eyelid for 7 days.  I also recommend starting over-the-counter allergy medicine such as Claritin or Zyrtec daily for at least 7 days.  Continue cool compresses as needed.  Please follow-up with your eye doctor in 1 to 2 days for recheck.  Please go to the ER if you develop any worsening symptoms.  This includes but is not limited to worsening redness or swelling of your eyelid, fevers or chills, or any new concerns that arise.  Hope you feel better soon!  ED Prescriptions     Medication Sig Dispense Auth. Provider   amoxicillin-clavulanate (AUGMENTIN) 875-125 MG tablet Take 1 tablet by mouth every 12 (twelve) hours  for 10 days. 20 tablet Montez Cuda, Jodi R, NP   erythromycin ophthalmic ointment Place a 1/2 inch ribbon of ointment to the right upper eyelid three times a day for 7 days 3.5 g Jenah Vanasten, Jodi R, NP      PDMP not reviewed this encounter.   Alleen Arbour, NP 10/28/23 [phone]

## 2023-10-29 ENCOUNTER — Ambulatory Visit: Admitting: Internal Medicine

## 2023-10-29 DIAGNOSIS — B029 Zoster without complications: Secondary | ICD-10-CM | POA: Diagnosis not present

## 2023-11-01 DIAGNOSIS — L237 Allergic contact dermatitis due to plants, except food: Secondary | ICD-10-CM | POA: Diagnosis not present

## 2023-11-01 DIAGNOSIS — B029 Zoster without complications: Secondary | ICD-10-CM | POA: Diagnosis not present

## 2023-11-06 ENCOUNTER — Ambulatory Visit (INDEPENDENT_AMBULATORY_CARE_PROVIDER_SITE_OTHER): Admitting: Family Medicine

## 2023-11-06 ENCOUNTER — Encounter: Payer: Self-pay | Admitting: Family Medicine

## 2023-11-06 ENCOUNTER — Ambulatory Visit: Payer: Self-pay | Admitting: Family Medicine

## 2023-11-06 VITALS — BP 138/69 | HR 76 | Temp 98.2°F | Resp 18 | Ht 64.0 in | Wt 209.2 lb

## 2023-11-06 DIAGNOSIS — M79672 Pain in left foot: Secondary | ICD-10-CM | POA: Diagnosis not present

## 2023-11-06 DIAGNOSIS — R931 Abnormal findings on diagnostic imaging of heart and coronary circulation: Secondary | ICD-10-CM | POA: Diagnosis not present

## 2023-11-06 DIAGNOSIS — E7800 Pure hypercholesterolemia, unspecified: Secondary | ICD-10-CM

## 2023-11-06 DIAGNOSIS — E1169 Type 2 diabetes mellitus with other specified complication: Secondary | ICD-10-CM

## 2023-11-06 DIAGNOSIS — I10 Essential (primary) hypertension: Secondary | ICD-10-CM

## 2023-11-06 LAB — CBC WITH DIFFERENTIAL/PLATELET
Basophils Absolute: 0 10*3/uL (ref 0.0–0.1)
Basophils Relative: 0.2 % (ref 0.0–3.0)
Eosinophils Absolute: 0 10*3/uL (ref 0.0–0.7)
Eosinophils Relative: 0 % (ref 0.0–5.0)
HCT: 41.4 % (ref 36.0–46.0)
Hemoglobin: 13.8 g/dL (ref 12.0–15.0)
Lymphocytes Relative: 11.4 % — ABNORMAL LOW (ref 12.0–46.0)
Lymphs Abs: 1.2 10*3/uL (ref 0.7–4.0)
MCHC: 33.4 g/dL (ref 30.0–36.0)
MCV: 90.6 fl (ref 78.0–100.0)
Monocytes Absolute: 0.5 10*3/uL (ref 0.1–1.0)
Monocytes Relative: 4.6 % (ref 3.0–12.0)
Neutro Abs: 9.1 10*3/uL — ABNORMAL HIGH (ref 1.4–7.7)
Neutrophils Relative %: 83.8 % — ABNORMAL HIGH (ref 43.0–77.0)
Platelets: 191 10*3/uL (ref 150.0–400.0)
RBC: 4.57 Mil/uL (ref 3.87–5.11)
RDW: 13.4 % (ref 11.5–15.5)
WBC: 10.9 10*3/uL — ABNORMAL HIGH (ref 4.0–10.5)

## 2023-11-06 LAB — LIPID PANEL
Cholesterol: 169 mg/dL (ref 0–200)
HDL: 79.1 mg/dL (ref >39.00)
LDL Cholesterol: 74 mg/dL (ref 0–99)
NonHDL: 89.55
Total CHOL/HDL Ratio: 2
Triglycerides: 76 mg/dL (ref 0.0–149.0)
VLDL: 15.2 mg/dL (ref 0.0–40.0)

## 2023-11-06 LAB — COMPREHENSIVE METABOLIC PANEL WITH GFR
ALT: 26 U/L (ref 0–35)
AST: 21 U/L (ref 0–37)
Albumin: 4.6 g/dL (ref 3.5–5.2)
Alkaline Phosphatase: 51 U/L (ref 39–117)
BUN: 21 mg/dL (ref 6–23)
CO2: 25 meq/L (ref 19–32)
Calcium: 9.8 mg/dL (ref 8.4–10.5)
Chloride: 103 meq/L (ref 96–112)
Creatinine, Ser: 0.68 mg/dL (ref 0.40–1.20)
GFR: 87.16 mL/min (ref >60.00)
Glucose, Bld: 133 mg/dL — ABNORMAL HIGH (ref 70–99)
Potassium: 4 meq/L (ref 3.5–5.1)
Sodium: 139 meq/L (ref 135–145)
Total Bilirubin: 0.4 mg/dL (ref 0.2–1.2)
Total Protein: 7.2 g/dL (ref 6.0–8.3)

## 2023-11-06 LAB — HEMOGLOBIN A1C: Hgb A1c MFr Bld: 5.6 % (ref 4.6–6.5)

## 2023-11-06 MED ORDER — ROSUVASTATIN CALCIUM 20 MG PO TABS: 20.0000 mg | ORAL_TABLET | Freq: Every day | ORAL | 2 refills | Status: AC

## 2023-11-06 NOTE — Progress Notes (Signed)
 Subjective:     Patient ID: Tina Sutton, female    DOB: [birth year], 73 y.o.   MRN: [id number]  Chief Complaint  Patient presents with   Follow-up    Hospital Follow-up  6 month follow-up     HPI R eyelid cellulitis 5/18. Aug and ees oint [facility] CAD,dm/pre Discussed the use of AI scribe software for clinical note transcription with the patient, who gave verbal consent to proceed.  History of Present Illness Tina Sutton is a 73 year old female who presents with a rash.  She initially experienced a swollen, red, and tender right eyelid, which was treated with Augmentin  for four to five days starting Oct 28, 2023. Her ophthalmologist suspected shingles and initiated antiviral treatment, but the rash subsequently spread down her right neck. A dermatologist later diagnosed it as dermatitis from poison ivy or poison oak on Nov 01, 2023. She has been on prednisone  since then, which has improved her symptoms, although the rash remains red and is covered with makeup.  She has a history of diabetes diagnosed in 2021. She was previously on Ozempic  but has been off it for over a year. She is actively involved with a weight loss clinic focusing on diet and exercise and does not regularly monitor her blood glucose levels.  Her hypertension is managed with losartan  100 mg and hydrochlorothiazide  12.5 mg. She monitors her blood pressure weekly, with recent readings of 162/92 on Oct 23, 2023, and 130/72 in April.  She experiences occasional heartburn at night, which led to a cardiology consultation earlier this month. She denies current chest pain or palpitations but reports dyspnea on exertion, particularly when climbing stairs. Her physical activity has decreased due to her dog's health, but she tracks her steps and aims to increase her activity.  She takes rosuvastatin  20 mg for cholesterol management and requires a new prescription. She also takes aspirin following a previous calcium  score  evaluation.  She reports a clicking sound in her right foot when walking barefoot, which is not painful, and experiences tightness in her left foot that is sometimes painful.    Health Maintenance Due  Topic Date Due   HEMOGLOBIN A1C  10/11/2023    Past Medical History:  Diagnosis Date   Anemia    Arthritis    Cancer (HCC)    squamous cell cheek   Cataract    Surgery 2022   Depression    Dysmenorrhea    Fibroid    Heart murmur    Hyperlipemia    Hyperlipidemia    Hypertension    Inverted nipple x several yrs   bilateral, pt states nipples have always been inverted   Joint pain    Obesity    Sleep apnea    SOB (shortness of breath)    Vitamin D  deficiency     Past Surgical History:  Procedure Laterality Date   BLEPHAROPLASTY Bilateral 02/21/2008   BREAST BIOPSY Right    BREAST EXCISIONAL BIOPSY Right    benign   COLONOSCOPY     EYE SURGERY  02/21/08   Blepharoplasty   PELVIC LAPAROSCOPY     TOTAL ABDOMINAL HYSTERECTOMY  05/04/2001   [place] [facility].  fibroids     Current Outpatient Medications:    aspirin EC 81 MG tablet, Take 81 mg by mouth daily., Disp: , Rfl:    aspirin EC 81 MG tablet, 1 tablet Orally Once a day, Disp: , Rfl:    Calcium  Carbonate-Vit D-Min (CALCIUM  1200 PO), Take by  mouth daily., Disp: , Rfl:    Cholecalciferol (VITAMIN D ) 50 MCG (2000 UT) CAPS, Take by mouth., Disp: , Rfl:    erythromycin  ophthalmic ointment, Place a 1/2 inch ribbon of ointment to the right upper eyelid three times a day for 7 days, Disp: 3.5 g, Rfl: 0   ferrous sulfate 324 MG TBEC, Take 324 mg by mouth daily with breakfast., Disp: , Rfl:    hydrochlorothiazide  (HYDRODIURIL ) 12.5 MG tablet, Take 1 tablet (12.5 mg total) by mouth daily., Disp: 90 tablet, Rfl: 3   losartan  (COZAAR ) 100 MG tablet, Take 1 tablet (100 mg total) by mouth daily., Disp: 90 tablet, Rfl: 3   predniSONE  (DELTASONE ) 20 MG tablet, , Disp: , Rfl:    Probiotic Product (PROBIOTIC-10 PO), Take by mouth., Disp: ,  Rfl:    triamcinolone  ointment (KENALOG ) 0.1 %, SMARTSIG:sparingly Topical Twice Daily, Disp: , Rfl:    valACYclovir (VALTREX) 1000 MG tablet, Take 1,000 mg by mouth 3 (three) times daily., Disp: , Rfl:    rosuvastatin  (CRESTOR ) 20 MG tablet, Take 1 tablet (20 mg total) by mouth daily., Disp: 90 tablet, Rfl: 2  No Known Allergies ROS neg/noncontributory except as noted HPI/below      Objective:      BP 138/69   Pulse 76   Temp 98.2 F (36.8 C) (Temporal)   Resp 18   Ht 5\' 4"  (1.626 m)   Wt 209 lb 4 oz (94.9 kg)   LMP 06/12/2000   SpO2 98%   BMI 35.92 kg/m  Wt Readings from Last 3 Encounters:  11/06/23 209 lb 4 oz (94.9 kg)  10/22/23 210 lb 3.2 oz (95.3 kg)  06/28/23 207 lb (93.9 kg)    Physical Exam   Gen: WDWN NAD HEENT: NCAT, conjunctiva not injected, sclera nonicteric NECK:  supple, no thyromegaly, no nodes, no carotid bruits CARDIAC: RRR, S1S2+, 2/6 murmur. DP 2+B LUNGS: CTAB. No wheezes ABDOMEN:  BS+, soft, NTND, No HSM, no masses EXT:  tr edema MSK: no gross abnormalities.  NEURO: A&O x3.  CN II-XII intact.  PSYCH: normal mood. Good eye contact  Healing rash R face/neck  Feet-no ttp.  No obvious abn.   Reviewed UC notes    Assessment & Plan:  Type 2 diabetes mellitus with other specified complication, without long-term current use of insulin  (HCC) -     Hemoglobin A1c -     Comprehensive metabolic panel with GFR  Essential hypertension -     CBC with Differential/Platelet -     Comprehensive metabolic panel with GFR  Pure hypercholesterolemia -     Hemoglobin A1c -     Rosuvastatin  Calcium ; Take 1 tablet (20 mg total) by mouth daily.  Dispense: 90 tablet; Refill: 2 -     Lipid panel  High coronary artery calcium  score  Left foot pain  Assessment and Plan Assessment & Plan Contact dermatitis due to poison ivy   Initially misdiagnosed as shingles and cellulitis, a dermatologist confirmed poison ivy dermatitis. The rash began on the face,  spread to the neck, and is improving with prednisone . It remains but is less itchy and covered with makeup. Antibiotics have been discontinued. Continue prednisone  as prescribed, use eye ointment at night, and avoid unapproved facial treatments around the eyes.  Hypertension   Blood pressure is generally well-controlled with losartan  and hydrochlorothiazide . A recent reading of 162/92 mmHg was due to a missed dose, while a previous reading in April was 130/72 mmHg. There is no chest  pain or palpitations, and cardiologist follow-up is ongoing. Continue losartan  100 mg daily, hydrochlorothiazide  12.5 mg daily, monitor blood pressure regularly, and ensure medication adherence.  Type 2 diabetes mellitus without complications   Diagnosed in 2021, she discontinued Ozempic  over a year ago and is engaged in diet and exercise through a weight loss clinic. Regular blood sugar monitoring is not needed. Emphasis is on physical activity, especially while on prednisone , to manage blood sugar levels. Continue the diet and exercise regimen and increase physical activity,   HLD-chronic.  Controlled.  Continue rosuvastatin  20 mg daily  Foot pain and clicking   Right foot clicking occurs without pain, possibly due to ligament issues from past sprains. Left foot tightness and occasional pain are likely from loss of fat pads and need for stretching. There is no current podiatrist involvement. Perform foot and ankle stretches, including toe raises and flex extensions, avoid walking barefoot to prevent arch collapse, and consider a podiatrist consultation if symptoms worsen.  General Health Maintenance   She refused the annual wellness visit due to personal preference, citing regular biannual visits as sufficient. Document refusal of the annual wellness visit as personal preference.    Return in about 6 months (around 05/08/2024) for chronic follow-up.   Ate PTA  Ellsworth Haas, MD

## 2023-11-06 NOTE — Patient Instructions (Addendum)
 It was very nice to see you today!  Stretches-ABC.  Roll on golf or lacrosse ball.  Supportive shoes.    PLEASE NOTE:  If you had any lab tests please let us  know if you have not heard back within a few days. You may see your results on MyChart before we have a chance to review them but we will give you a call once they are reviewed by us . If we ordered any referrals today, please let us  know if you have not heard from their office within the next week.   Please try these tips to maintain a healthy lifestyle:  Eat most of your calories during the day when you are active. Eliminate processed foods including packaged sweets (pies, cakes, cookies), reduce intake of potatoes, white bread, white pasta, and white rice. Look for whole grain options, oat flour or almond flour.  Each meal should contain half fruits/vegetables, one quarter protein, and one quarter carbs (no bigger than a computer mouse).  Cut down on sweet beverages. This includes juice, soda, and sweet tea. Also watch fruit intake, though this is a healthier sweet option, it still contains natural sugar! Limit to 3 servings daily.  Drink at least 1 glass of water with each meal and aim for at least 8 glasses per day  Exercise at least 150 minutes every week.

## 2023-11-06 NOTE — Progress Notes (Signed)
 Labs great.  Wbc and sugar elevated from prednisone  as we discussed.

## 2023-11-12 ENCOUNTER — Encounter: Payer: Medicare HMO | Admitting: Obstetrics and Gynecology

## 2023-11-23 ENCOUNTER — Ambulatory Visit (INDEPENDENT_AMBULATORY_CARE_PROVIDER_SITE_OTHER): Admitting: Obstetrics and Gynecology

## 2023-11-23 VITALS — BP 128/82 | HR 66 | Ht 63.25 in | Wt 211.0 lb

## 2023-11-23 DIAGNOSIS — Z01419 Encounter for gynecological examination (general) (routine) without abnormal findings: Secondary | ICD-10-CM

## 2023-11-23 DIAGNOSIS — Z1331 Encounter for screening for depression: Secondary | ICD-10-CM | POA: Diagnosis not present

## 2023-11-23 DIAGNOSIS — N952 Postmenopausal atrophic vaginitis: Secondary | ICD-10-CM | POA: Diagnosis not present

## 2023-11-23 DIAGNOSIS — N762 Acute vulvitis: Secondary | ICD-10-CM | POA: Diagnosis not present

## 2023-11-23 DIAGNOSIS — Z9189 Other specified personal risk factors, not elsewhere classified: Secondary | ICD-10-CM

## 2023-11-23 DIAGNOSIS — M810 Age-related osteoporosis without current pathological fracture: Secondary | ICD-10-CM

## 2023-11-23 MED ORDER — ROMOSOZUMAB-AQQG 105 MG/1.17ML ~~LOC~~ SOSY: 210.0000 mg | PREFILLED_SYRINGE | Freq: Once | SUBCUTANEOUS | Status: DC

## 2023-11-23 MED ORDER — ESTRADIOL 0.1 MG/GM VA CREA: TOPICAL_CREAM | VAGINAL | 12 refills | Status: AC

## 2023-11-23 MED ORDER — CLOBETASOL PROPIONATE 0.05 % EX CREA: 1.0000 | TOPICAL_CREAM | Freq: Two times a day (BID) | CUTANEOUS | 0 refills | Status: AC

## 2023-11-23 NOTE — Progress Notes (Signed)
 73 y.o. y.o. female here for annual  medicare gyn exam. Patient's last menstrual period was 06/12/2000.    G0P0000 Single White or Caucasian Not Hispanic or Latino female here for annual exam.  H/O TAH/BSO.  Reports increased libido and would like to begin dating Lives in senior community  Faith is important to her.  She was treated for a vulvitis in 4/24, feeling better on clobetasol .  TO add estrogen cream with use and then 3 times weekly without using the clobetasol .  Patient's last menstrual period was 06/12/2000.          Sexually active: No.  The current method of family planning is status post hysterectomy.    Exercising: Yes.    She is active  Smoker:  no  Health Maintenance: Pap:  07/02/15 [facility] - per patient normal  History of abnormal Pap:  no MMG:  08/08/22 density C Bi-rads 1 neg  BMD:    11/26/2017 mild osteopenia, T score -1.2, FRAX 14.3/0.8%  2024 -1.5 RFN with positive frax score 22.3, 5.6% needs treatment.  Counseled on Evenity/prolia r/b/a/I were discussed labs done. Patient agreed. PA sent.  Colonoscopy:  11/23/2017 polyps, f/u 2024  TDaP:  06/25/2018 Gardasil: n/a Body mass index is 37.08 kg/m.     11/23/2023    8:29 AM 04/13/2023   11:09 AM 11/10/2021    8:17 AM  Depression screen PHQ 2/9  Decreased Interest 0 0 0  Down, Depressed, Hopeless 1 0 0  PHQ - 2 Score 1 0 0  Altered sleeping  1   Tired, decreased energy  1   Change in appetite  0   Feeling bad or failure about yourself   0   Trouble concentrating  0   Moving slowly or fidgety/restless  0   Suicidal thoughts  0   PHQ-9 Score  2   Difficult doing work/chores  Not difficult at all     Blood pressure 128/82, pulse 66, height 5' 3.25 (1.607 m), weight 211 lb (95.7 kg), last menstrual period 06/12/2000, SpO2 99%.  No results found for: DIAGPAP, HPVHIGH, ADEQPAP  GYN HISTORY: No results found for: DIAGPAP, HPVHIGH, ADEQPAP  OB History  Gravida Para Term  Preterm AB Living  0 0 0 0 0 0  SAB IAB Ectopic Multiple Live Births  0 0 0 0 0    Past Medical History:  Diagnosis Date   Anemia    Arthritis    Cancer (HCC)    squamous cell cheek   Cataract    Surgery 2022   Depression    Dysmenorrhea    Fibroid    Heart murmur    Hyperlipemia    Hyperlipidemia    Hypertension    Inverted nipple x several yrs   bilateral, pt states nipples have always been inverted   Joint pain    Obesity    Sleep apnea    SOB (shortness of breath)    Vitamin D  deficiency     Past Surgical History:  Procedure Laterality Date   BLEPHAROPLASTY Bilateral 02/21/2008   BREAST BIOPSY Right    BREAST EXCISIONAL BIOPSY Right    benign   COLONOSCOPY     EYE SURGERY  02/21/08   Blepharoplasty   PELVIC LAPAROSCOPY     TOTAL ABDOMINAL HYSTERECTOMY  05/04/2001   [place] bso.  fibroids    Current Outpatient Medications on File Prior to Visit  Medication Sig Dispense Refill   aspirin EC 81 MG  tablet 1 tablet Orally Once a day     Calcium  Carbonate-Vit D-Min (CALCIUM  1200 PO) Take by mouth daily.     Cholecalciferol (VITAMIN D ) 50 MCG (2000 UT) CAPS Take by mouth.     ferrous sulfate 324 MG TBEC Take 324 mg by mouth daily with breakfast.     hydrochlorothiazide  (HYDRODIURIL ) 12.5 MG tablet Take 1 tablet (12.5 mg total) by mouth daily. 90 tablet 3   losartan  (COZAAR ) 100 MG tablet Take 1 tablet (100 mg total) by mouth daily. 90 tablet 3   Probiotic Product (PROBIOTIC-10 PO) Take by mouth.     rosuvastatin  (CRESTOR ) 20 MG tablet Take 1 tablet (20 mg total) by mouth daily. 90 tablet 2   No current facility-administered medications on file prior to visit.    Social History   Socioeconomic History   Marital status: Single    Spouse name: Not on file   Number of children: 0   Years of education: Not on file   Highest education level: Doctorate  Occupational History   Occupation: retired  Tobacco Use   Smoking status: Never   Smokeless tobacco: Never   Vaping Use   Vaping status: Never Used  Substance and Sexual Activity   Alcohol use: Yes    Alcohol/week: 1.0 - 5.0 standard drink of alcohol   Drug use: No   Sexual activity: Not Currently    Birth control/protection: Post-menopausal, Surgical    Comment: Hysterectomy  Other Topics Concern   Not on file  Social History Narrative   Dog   Retired Location manager [employer]   Social Drivers of Health   Financial Resource Strain: Low Risk  (06/28/2023)   Overall Financial Resource Strain (CARDIA)    Difficulty of Paying Living Expenses: Not hard at all  Food Insecurity: No Food Insecurity (06/28/2023)   Hunger Vital Sign    Worried About Running Out of Food in the Last Year: Never true    Ran Out of Food in the Last Year: Never true  Transportation Needs: No Transportation Needs (06/28/2023)   PRAPARE - Administrator, Civil Service (Medical): No    Lack of Transportation (Non-Medical): No  Physical Activity: Insufficiently Active (06/28/2023)   Exercise Vital Sign    Days of Exercise per Week: 3 days    Minutes of Exercise per Session: 20 min  Stress: No Stress Concern Present (06/28/2023)   [employer] of Occupational Health - Occupational Stress Questionnaire    Feeling of Stress : Not at all  Social Connections: Moderately Integrated (06/28/2023)   Social Connection and Isolation Panel    Frequency of Communication with Friends and Family: More than three times a week    Frequency of Social Gatherings with Friends and Family: More than three times a week    Attends Religious Services: More than 4 times per year    Active Member of [employer] or Organizations: Yes    Attends Engineer, structural: More than 4 times per year    Marital Status: Never married  Catering manager Violence: Not on file    Family History  Problem Relation Age of Onset   Hypertension Mother    Alzheimer's disease Mother    Heart disease Mother    Hyperlipidemia  Mother    Depression Mother    Obesity Mother    Hypertension Father    Diabetes Father    Stroke Father    Obesity Father    Colon polyps Father  Vision loss Father    Breast cancer Sister    Hypertension Sister    Cancer Sister    Breast cancer Sister    Hypertension Sister    Cancer Sister    COPD Sister    Hypertension Sister    Asthma Sister    Early death Sister    Miscarriages / [place] Sister    Esophageal cancer Neg Hx    Liver disease Neg Hx    Colon cancer Neg Hx    Stomach cancer Neg Hx    Rectal cancer Neg Hx      No Known Allergies    Patient's last menstrual period was Patient's last menstrual period was 06/12/2000..           Review of Systems Alls systems reviewed and are negative.     Physical Exam Constitutional:      Appearance: Normal appearance.  Genitourinary:     Vulva and urethral meatus normal.     No lesions in the vagina.     Right Labia: No rash, lesions or skin changes.    Left Labia: No lesions, skin changes or rash.    No vaginal discharge or tenderness.     No vaginal prolapse present.    Severe vaginal atrophy present.     Right Adnexa: absent.    Left Adnexa: absent.    Cervix is absent.     No cervical discharge.     Uterus is absent.  Breasts:    Right: Normal.     Left: Normal.  HENT:     Head: Normocephalic.  Neck:     Thyroid : No thyroid  mass, thyromegaly or thyroid  tenderness.   Cardiovascular:     Rate and Rhythm: Normal rate and regular rhythm.     Heart sounds: Normal heart sounds, S1 normal and S2 normal.  Pulmonary:     Effort: Pulmonary effort is normal.     Breath sounds: Normal breath sounds and air entry.  Abdominal:     General: There is no distension.     Palpations: Abdomen is soft. There is no mass.     Tenderness: There is no abdominal tenderness. There is no guarding or rebound.   Musculoskeletal:        General: Normal range of motion.     Cervical back: Full passive range of  motion without pain, normal range of motion and neck supple. No tenderness.     Right lower leg: No edema.     Left lower leg: No edema.   Neurological:     Mental Status: She is alert.   Skin:    General: Skin is warm.   Psychiatric:        Mood and Affect: Mood normal.        Behavior: Behavior normal.        Thought Content: Thought content normal.  Vitals and nursing note reviewed. Exam conducted with a chaperone present.      Leslee Rase, CMA was present for entire exam  A:         Medicare Well Woman GYN exam                             P:        Pap smear not indicated Encouraged annual mammogram screening Colon cancer screening aged out DXA up-to-date  Needs bone support medication.  Would benefit from evenity/prolia. Counseled on both medications r/b/a/I discussed  brochure given. PA placed. Labs done Labs and immunizations to do with PMD Discussed breast self exams Encouraged healthy lifestyle practices Encouraged Vit D and Calcium    No follow-ups on file.  Tina Sutton

## 2023-11-24 LAB — VITAMIN D 25 HYDROXY (VIT D DEFICIENCY, FRACTURES): Vit D, 25-Hydroxy: 53 ng/mL (ref 30–100)

## 2023-11-26 ENCOUNTER — Ambulatory Visit: Payer: Self-pay | Admitting: Obstetrics and Gynecology

## 2023-11-27 ENCOUNTER — Encounter: Payer: Self-pay | Admitting: Family Medicine

## 2023-12-03 ENCOUNTER — Encounter: Payer: Self-pay | Admitting: Family Medicine

## 2023-12-03 ENCOUNTER — Ambulatory Visit (INDEPENDENT_AMBULATORY_CARE_PROVIDER_SITE_OTHER): Admitting: Family Medicine

## 2023-12-03 VITALS — BP 136/82 | HR 77 | Temp 97.5°F | Resp 18 | Ht 63.25 in | Wt 213.5 lb

## 2023-12-03 DIAGNOSIS — F4321 Adjustment disorder with depressed mood: Secondary | ICD-10-CM | POA: Diagnosis not present

## 2023-12-03 MED ORDER — ESCITALOPRAM OXALATE 10 MG PO TABS: 10.0000 mg | ORAL_TABLET | Freq: Every day | ORAL | 1 refills | Status: DC

## 2023-12-03 NOTE — Progress Notes (Signed)
 Subjective:     Patient ID: Tina Sutton, female    DOB: 1950-10-24, 73 y.o.   MRN: [id number]  Chief Complaint  Patient presents with   New Med Request    Discuss need for antidepressant     HPI Gained wt and hot flashes back Discussed the use of AI scribe software for clinical note transcription with the patient, who gave verbal consent to proceed.  History of Present Illness Tina Sutton is a 73 year old female who presents with tearfulness and emotional distress.  She has been experiencing tearfulness and emotional distress for several months, frequently feeling 'on the verge of tears' and crying easily, sometimes inappropriately. She avoids funerals, even for people she knew well, due to her emotional reactions. Tearfulness was also noted during a church ceremony before her dog passed away, indicating a fragile emotional state.  She denies suicidal thoughts, anxiety, and hopelessness. 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 She visits her friend at least twice a day to ensure medication compliance, increasing her caregiving responsibilities.  She has a history of taking Prozac starting in 2007, which she discontinued around 2015 or earlier due to feeling giddy at times. She has not taken any other antidepressants.    There are no preventive care reminders to display for this patient.  Past Medical History:  Diagnosis Date   Anemia    Arthritis    Cancer (HCC)    squamous cell cheek   Cataract    Surgery 2022   Depression 10/10/2005   Resolved   Dysmenorrhea    Fibroid    Heart murmur 11/05/20   Hyperlipemia    Hyperlipidemia    Hypertension 1980   Inverted nipple x several yrs   bilateral, pt states nipples have always been inverted   Joint pain    Obesity    Sleep apnea 2014    Resolved   SOB (shortness of breath)    Vitamin D  deficiency     Past Surgical History:  Procedure Laterality Date   BLEPHAROPLASTY Bilateral 02/21/2008   BREAST BIOPSY Right    BREAST EXCISIONAL BIOPSY Right    benign   COLONOSCOPY     EYE SURGERY  02/21/08   Blepharoplasty   PELVIC LAPAROSCOPY     TOTAL ABDOMINAL HYSTERECTOMY  05/04/2001   [place].  fibroids     Current Outpatient Medications:    aspirin EC 81 MG tablet, 1 tablet Orally Once a day, Disp: , Rfl:    Calcium  Carbonate-Vit D-Min (CALCIUM  1200 PO), Take by mouth daily., Disp: , Rfl:    Cholecalciferol (VITAMIN D ) 50 MCG (2000 UT) CAPS, Take by mouth., Disp: , Rfl:    clobetasol  cream (TEMOVATE ) 0.05 %, Apply 1 Application topically 2 (two) times daily., Disp: 30 g, Rfl: 0   escitalopram (LEXAPRO) 10 MG tablet, Take 1 tablet (10 mg total) by mouth daily., Disp: 30 tablet, Rfl: 1   estradiol  (ESTRACE  VAGINAL) 0.1 MG/GM vaginal cream, Rub pea size amount each night for 3 weeks then 3 times a week thereafter. Use daily when you are using clobetasol , Disp: 42.5 g, Rfl: 12   ferrous sulfate 324 MG TBEC, Take 324 mg by mouth daily with breakfast., Disp: , Rfl:    hydrochlorothiazide  (HYDRODIURIL ) 12.5 MG tablet, Take 1 tablet (12.5 mg total)  by mouth daily., Disp: 90 tablet, Rfl: 3   losartan  (COZAAR ) 100 MG tablet, Take 1 tablet (100 mg total) by mouth daily., Disp: 90 tablet, Rfl: 3   Probiotic Product (PROBIOTIC-10 PO), Take by mouth., Disp: , Rfl:    rosuvastatin  (CRESTOR ) 20 MG tablet, Take 1 tablet (20 mg total) by mouth daily., Disp: 90 tablet, Rfl: 2  Current Facility-Administered Medications:    [START ON 12/07/2023] Romosozumab -aqqg (EVENITY ) 105 MG/1. injection 210 mg, 210 mg, Subcutaneous, Once,   No Known Allergies ROS neg/noncontributory except as noted HPI/below      Objective:     BP 136/82 (BP Location: Left Arm, Patient Position: Sitting, Cuff Size: Large)   Pulse 77   Temp (!) 97.5 F (36.4  C) (Temporal)   Resp 18   Ht 5' 3.25 (1.607 m)   Wt 213 lb 8 oz (96.8 kg)   LMP 06/12/2000   SpO2 96%   BMI 37.52 kg/m  Wt Readings from Last 3 Encounters:  12/03/23 213 lb 8 oz (96.8 kg)  11/23/23 211 lb (95.7 kg)  11/06/23 209 lb 4 oz (94.9 kg)    Physical Exam   Gen: WDWN NAD HEENT: NCAT, conjunctiva not injected, sclera nonicteric EXT:  no edema MSK: no gross abnormalities.  NEURO: A&O x3.  CN II-XII intact.  PSYCH: normal mood. Good eye contact     Assessment & Plan:  Adjustment disorder with depressed mood -     Escitalopram Oxalate; Take 1 tablet (10 mg total) by mouth daily.  Dispense: 30 tablet; Refill: 1  Assessment and Plan Assessment & Plan Tearfulness   She experiences significant tearfulness in various situations, such as reading a bulletin at church and attending funerals. She denies suicidal ideation, anxiety, or hopelessness and does not identify as depressed. 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 Start escitalopram 10mg (Lexapro) with a half tablet daily for one week, then increase to a full tablet daily. Discuss potential side effects, including dizziness, nausea, diarrhea, anorexia, xerostomia, and decreased libido. Monitor for suicidal ideation as a side effect. Schedule follow-up in one month to evaluate medication efficacy and adjust as needed.  Bone Density Concerns   Her gynecologist recommended Evenity  for bone density issues, and the approval process is underway. Await approval for Evenity  as recommended.    Return in about 4 weeks (around 12/31/2023) for mood.  Jenkins CHRISTELLA Carrel, MD

## 2023-12-03 NOTE — Patient Instructions (Signed)
 Start with 1/2 tab per day for 1 week, then the whole tab.

## 2023-12-06 ENCOUNTER — Ambulatory Visit (INDEPENDENT_AMBULATORY_CARE_PROVIDER_SITE_OTHER): Payer: Medicare HMO | Admitting: Family Medicine

## 2023-12-06 ENCOUNTER — Encounter (INDEPENDENT_AMBULATORY_CARE_PROVIDER_SITE_OTHER): Payer: Self-pay | Admitting: Family Medicine

## 2023-12-06 VITALS — BP 131/82 | HR 75 | Temp 97.7°F | Ht 63.0 in | Wt 208.0 lb

## 2023-12-06 DIAGNOSIS — E669 Obesity, unspecified: Secondary | ICD-10-CM

## 2023-12-06 DIAGNOSIS — Z6836 Body mass index (BMI) 36.0-36.9, adult: Secondary | ICD-10-CM

## 2023-12-06 DIAGNOSIS — F439 Reaction to severe stress, unspecified: Secondary | ICD-10-CM | POA: Diagnosis not present

## 2023-12-06 DIAGNOSIS — E119 Type 2 diabetes mellitus without complications: Secondary | ICD-10-CM | POA: Diagnosis not present

## 2023-12-06 DIAGNOSIS — E559 Vitamin D deficiency, unspecified: Secondary | ICD-10-CM

## 2023-12-06 DIAGNOSIS — M810 Age-related osteoporosis without current pathological fracture: Secondary | ICD-10-CM

## 2023-12-06 DIAGNOSIS — E1169 Type 2 diabetes mellitus with other specified complication: Secondary | ICD-10-CM

## 2023-12-06 NOTE — Progress Notes (Signed)
 Office: [phone]  /  Fax: [phone]  WEIGHT SUMMARY AND BIOMETRICS  Anthropometric Measurements Height: 5' 3 (1.6 m) Weight: 208 lb (94.3 kg) BMI (Calculated): 36.85 Weight at Last Visit: 203 lb Weight Lost Since Last Visit: 0 Weight Gained Since Last Visit: 5 lb Starting Weight: 226 lb Total Weight Loss (lbs): 18 lb (8.165 kg)   Body Composition  Body Fat %: 45.7 % Fat Mass (lbs): 95 lbs Muscle Mass (lbs): 107.2 lbs Total Body Water (lbs): 81.6 lbs Visceral Fat Rating : 15   Other Clinical Data Fasting: No Labs: No Today's Visit #: 60 Starting Date: 12/09/19    Chief Complaint: OBESITY    History of Present Illness Tina Sutton is a 73 year old female who presents for obesity treatment.  She has experienced a weight gain of five pounds over the last six months and a total of fifteen pounds over the past year. She resides in a new community with her husband where meals are provided, which has disrupted her structured eating plan. Although she does not exercise regularly, she has increased her walking, averaging over 6,000 steps a day, with some days reaching 7,000 to 8,000 steps.  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 This has increased her daily mental load. Additionally, she recently had to put her dog to sleep, adding to her emotional distress. She has started taking Lexapro this week to help manage her emotional stress, particularly to control crying episodes. No suicidal ideation or general depression, but she describes her emotions as 'way too close to the edge.'  Her sleep quality is poor, with occasional good nights. She tracks her sleep using a Fitbit and reports it as unsatisfactory. She recently went on a three-day cruise with her niece, which she did not enjoy, but it was an opportunity to spend time with family.  In terms  of diet, she tries to make healthier choices by opting for fish and avoiding bread and desserts, except for occasional indulgences. She is mindful of her protein intake, often choosing two servings of chicken to ensure adequate protein consumption. She reports good hydration, alternating between water, electrolytes, and sugar-free lemonade, and drinks coffee every morning. She also engages in some yard work and plans to travel more now that she no longer has a Administrator, arts.  Her recent labs showed an A1c of 5.6 and a vitamin D  level of 53.      PHYSICAL EXAM:  Blood pressure 131/82, pulse 75, temperature 97.7 F (36.5 C), height 5' 3 (1.6 m), weight 208 lb (94.3 kg), last menstrual period 06/12/2000, SpO2 97%. Body mass index is 36.85 kg/m.  DIAGNOSTIC DATA REVIEWED:  BMET    Component Value Date/Time   NA 139 11/06/2023 1349   NA 140 01/03/2023 1131   K 4.0 11/06/2023 1349   CL 103 11/06/2023 1349   CO2 25 11/06/2023 1349   GLUCOSE 133 (H) 11/06/2023 1349   BUN 21 11/06/2023 1349   BUN 15 01/03/2023 1131   CREATININE 0.68 11/06/2023 1349   CALCIUM  9.8 11/06/2023 1349   GFRNONAA 86 06/22/2020 0825   GFRAA 100 06/22/2020 0825   Lab Results  Component Value Date   HGBA1C 5.6 11/06/2023   HGBA1C 6.5 10/31/2019   Lab Results  Component Value Date   INSULIN  10.7 01/03/2023   INSULIN  24.9 12/09/2019   Lab Results  Component Value Date   TSH  2.19 10/11/2022   CBC    Component Value Date/Time   WBC 10.9 (H) 11/06/2023 1349   RBC 4.57 11/06/2023 1349   HGB 13.8 11/06/2023 1349   HCT 41.4 11/06/2023 1349   PLT 191.0 11/06/2023 1349   MCV 90.6 11/06/2023 1349   MCH 30.3 07/30/2020 1107   MCHC 33.4 11/06/2023 1349   RDW 13.4 11/06/2023 1349   Iron Studies    Component Value Date/Time   IRON 131 04/13/2023 1136   TIBC 413.0 04/13/2023 1136   FERRITIN 12.8 04/13/2023 1136   IRONPCTSAT 31.7 04/13/2023 1136   IRONPCTSAT 24 11/27/2022 1414   Lipid Panel     Component Value  Date/Time   CHOL 169 11/06/2023 1349   CHOL 146 01/03/2023 1131   TRIG 76.0 11/06/2023 1349   HDL 79.10 11/06/2023 1349   HDL 77 01/03/2023 1131   CHOLHDL 2 11/06/2023 1349   VLDL 15.2 11/06/2023 1349   LDLCALC 74 11/06/2023 1349   LDLCALC 55 01/03/2023 1131   Hepatic Function Panel     Component Value Date/Time   PROT 7.2 11/06/2023 1349   PROT 6.8 01/03/2023 1131   ALBUMIN 4.6 11/06/2023 1349   ALBUMIN 4.5 01/03/2023 1131   AST 21 11/06/2023 1349   ALT 26 11/06/2023 1349   ALKPHOS 51 11/06/2023 1349   BILITOT 0.4 11/06/2023 1349   BILITOT 0.5 01/03/2023 1131   BILIDIR 0.1 04/12/2022 0849      Component Value Date/Time   TSH 2.19 10/11/2022 0820   Nutritional Lab Results  Component Value Date   VD25OH 53 11/23/2023   VD25OH 59.0 01/03/2023   VD25OH 49.9 07/05/2022     Assessment and Plan Assessment & Plan Stress with fatigue Experiencing high stress due to caregiving for a 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 Initiated Lexapro for emotional management, particularly crying. Reports poor sleep quality with occasional good nights. Attending a caregiver support group, which is beneficial. Lexapro may take up to six weeks for full effect, with potential earlier improvement. - Continue Lexapro as prescribed. - Attend caregiver support group regularly. - Encourage self-care and setting boundaries to manage stress.  Obesity Gained 15 pounds over the last year, with a recent 5-pound increase in the last six months. Resides in a community with provided meals, affecting structured eating plans. Despite weight gain, remains below starting weight of 230 pounds. High stress may contribute to weight gain. Making mindful eating choices, avoiding bread and desserts, focusing on protein intake. Walking over 6,000 steps daily. - Continue portion control and smart food choices. - Encourage strengthening exercises at least once a week. -  Consider joining community exercise classes.  Type 2 Diabetes A1c is well-controlled at 5.6. Making dietary choices to manage diabetes, focusing on protein intake and avoiding high-calorie foods. - Continue current dietary management for diabetes.   Vitamin D  level is 53, supporting bone health. Considering bone density treatment, possibly with a monthly injection. Hydrating well and engaging in regular physical activity. Vitamin D  is essential for bone density medication effectiveness. - Ensure adequate vitamin D  intake to support bone health. - Consider bone density treatment options.      She was informed of the importance of frequent follow up visits to maximize her success with intensive lifestyle modifications for her multiple health conditions.    Louann Penton, MD

## 2024-01-07 ENCOUNTER — Encounter: Payer: Self-pay | Admitting: Family Medicine

## 2024-01-07 ENCOUNTER — Ambulatory Visit (INDEPENDENT_AMBULATORY_CARE_PROVIDER_SITE_OTHER): Admitting: Family Medicine

## 2024-01-07 ENCOUNTER — Ambulatory Visit: Payer: Self-pay | Admitting: Family Medicine

## 2024-01-07 VITALS — BP 133/81 | HR 63 | Temp 97.8°F | Resp 18 | Ht 63.0 in | Wt 210.0 lb

## 2024-01-07 DIAGNOSIS — E1169 Type 2 diabetes mellitus with other specified complication: Secondary | ICD-10-CM

## 2024-01-07 DIAGNOSIS — F4321 Adjustment disorder with depressed mood: Secondary | ICD-10-CM

## 2024-01-07 LAB — MICROALBUMIN / CREATININE URINE RATIO
Creatinine,U: 107.8 mg/dL
Microalb Creat Ratio: 7.9 mg/g (ref 0.0–30.0)
Microalb, Ur: 0.9 mg/dL (ref 0.0–1.9)

## 2024-01-07 MED ORDER — ESCITALOPRAM OXALATE 10 MG PO TABS: 10.0000 mg | ORAL_TABLET | Freq: Every day | ORAL | 1 refills | Status: DC

## 2024-01-07 NOTE — Patient Instructions (Signed)
 It was very nice to see you today!  Can increase to 15mg  or 20mg  and let me know   PLEASE NOTE:  If you had any lab tests please let us  know if you have not heard back within a few days. You may see your results on MyChart before we have a chance to review them but we will give you a call once they are reviewed by us . If we ordered any referrals today, please let us  know if you have not heard from their office within the next week.   Please try these tips to maintain a healthy lifestyle:  Eat most of your calories during the day when you are active. Eliminate processed foods including packaged sweets (pies, cakes, cookies), reduce intake of potatoes, white bread, white pasta, and white rice. Look for whole grain options, oat flour or almond flour.  Each meal should contain half fruits/vegetables, one quarter protein, and one quarter carbs (no bigger than a computer mouse).  Cut down on sweet beverages. This includes juice, soda, and sweet tea. Also watch fruit intake, though this is a healthier sweet option, it still contains natural sugar! Limit to 3 servings daily.  Drink at least 1 glass of water with each meal and aim for at least 8 glasses per day  Exercise at least 150 minutes every week.

## 2024-01-07 NOTE — Progress Notes (Signed)
 No protein in urine: good

## 2024-01-07 NOTE — Progress Notes (Signed)
 Subjective:     Patient ID: Tina Sutton, female    DOB: 1951-01-01, 73 y.o.   MRN: [id number]  Chief Complaint  Patient presents with   Medical Management of Chronic Issues    1 month follow-up No food, had orange juice with pills    HPI Depression/anx Discussed the use of AI scribe software for clinical note transcription with the patient, who gave verbal consent to proceed.  History of Present Illness Tina Sutton is a 73 year old female with depression who presents for follow-up on her current treatment.  She is currently taking Lexapro  10 mg daily. She feels better knowing she is on medication, although she is unsure of its significant effect. She notes being 'maybe not quite as tearful' compared to before. A recent event, the passing of an acquaintance, will serve as a test for her emotional response, as she has previously felt overwhelmed at funerals.  She is involved in the care of a dying friend, which has been a significant source of stress. Her friend now has 24-hour care, which has alleviated some of the burden. She continues to visit her friend's apartment twice daily to monitor medications, ensuring accuracy in her friend's pill boxes. She describes feeling relieved and able to be a friend again, rather than solely a caregiver.  She has a history of diabetes. She has recently changed her pharmacy to [facility] due to the excellent service provided there.  In terms of weight management, she is working with a weight loss center and is focused on staying active and managing her diet. She reports a current weight of 210 pounds with clothes on and aims to get below 200 pounds.  No dizziness or thoughts of suicide reported. No other adverse effects from Lexapro .    Health Maintenance Due  Topic Date Due   Diabetic kidney evaluation - Urine ACR  Never done    Past Medical History:  Diagnosis Date   Anemia    Arthritis    Cancer (HCC)    squamous cell cheek    Cataract    Surgery 2022   Depression 10/10/2005   Resolved   Dysmenorrhea    Fibroid    Heart murmur 11/05/20   Hyperlipemia    Hyperlipidemia    Hypertension 1980   Inverted nipple x several yrs   bilateral, pt states nipples have always been inverted   Joint pain    Obesity    Sleep apnea 2014   Resolved   SOB (shortness of breath)    Vitamin D  deficiency     Past Surgical History:  Procedure Laterality Date   BLEPHAROPLASTY Bilateral 02/21/2008   BREAST BIOPSY Right    BREAST EXCISIONAL BIOPSY Right    benign   COLONOSCOPY     EYE SURGERY  02/21/08   Blepharoplasty   PELVIC LAPAROSCOPY     TOTAL ABDOMINAL HYSTERECTOMY  05/04/2001   [place].  fibroids     Current Outpatient Medications:    aspirin EC 81 MG tablet, 1 tablet Orally Once a day, Disp: , Rfl:    Calcium  Carbonate-Vit D-Min (CALCIUM  1200 PO), Take by mouth daily., Disp: , Rfl:    Cholecalciferol (VITAMIN D ) 50 MCG (2000 UT) CAPS, Take by mouth., Disp: , Rfl:    clobetasol  cream (TEMOVATE ) 0.05 %, Apply 1 Application topically 2 (two) times daily., Disp: 30 g, Rfl: 0   estradiol  (ESTRACE  VAGINAL) 0.1 MG/GM vaginal cream, Rub pea size amount each night for 3 weeks  then 3 times a week thereafter. Use daily when you are using clobetasol , Disp: 42.5 g, Rfl: 12   ferrous sulfate 324 MG TBEC, Take 324 mg by mouth daily with breakfast., Disp: , Rfl:    hydrochlorothiazide  (HYDRODIURIL ) 12.5 MG tablet, Take 1 tablet (12.5 mg total) by mouth daily., Disp: 90 tablet, Rfl: 3   losartan  (COZAAR ) 100 MG tablet, Take 1 tablet (100 mg total) by mouth daily., Disp: 90 tablet, Rfl: 3   Probiotic Product (PROBIOTIC-10 PO), Take by mouth., Disp: , Rfl:    rosuvastatin  (CRESTOR ) 20 MG tablet, Take 1 tablet (20 mg total) by mouth daily., Disp: 90 tablet, Rfl: 2   escitalopram  (LEXAPRO ) 10 MG tablet, Take 1 tablet (10 mg total) by mouth daily., Disp: 90 tablet, Rfl: 1  Current Facility-Administered Medications:     Romosozumab -aqqg (EVENITY ) 105 MG/1. injection 210 mg, 210 mg, Subcutaneous, Once,   No Known Allergies ROS neg/noncontributory except as noted HPI/below      Objective:     BP 133/81   Pulse 63   Temp 97.8 F (36.6 C) (Temporal)   Resp 18   Ht 5' 3 (1.6 m)   Wt 210 lb (95.3 kg)   LMP 06/12/2000   SpO2 96%   BMI 37.20 kg/m  Wt Readings from Last 3 Encounters:  01/07/24 210 lb (95.3 kg)  12/06/23 208 lb (94.3 kg)  12/03/23 213 lb 8 oz (96.8 kg)    Physical Exam   Gen: WDWN NAD HEENT: NCAT, conjunctiva not injected, sclera nonicteric CARDIAC: RRR, S1S2+, no murmur.  MSK: no gross abnormalities.  NEURO: A&O x3.  CN II-XII intact.  PSYCH: normal mood. Good eye contact     Assessment & Plan:  Adjustment disorder with depressed mood -     Escitalopram  Oxalate; Take 1 tablet (10 mg total) by mouth daily.  Dispense: 90 tablet; Refill: 1  Type 2 diabetes mellitus with other specified complication, without long-term current use of insulin  (HCC) -     Microalbumin / creatinine urine ratio  Assessment and Plan Assessment & Plan Depression   She is currently on Lexapro  10 mg daily and reports improvement with less tearfulness, though she is uncertain of a significant effect. Recent stressors, including the death of an acquaintance and caring for a dying friend, may impact her mood. She is concerned about overmedication, as previously experienced with Prozac, but reports no adverse effects such as dizziness or suicidal ideation. She prefers to maintain the current dose and monitor her response, especially around upcoming stressors like a funeral. Continue Lexapro  10 mg daily. Consider increasing to 15 mg or 20 mg if needed, and monitor for changes. Send prescription for Lexapro  10 mg with refills. Monitor for adverse effects, including suicidal ideation or dizziness. Inform provider if a dose increase to 15 mg or 20 mg is decided.  Diabetes Mellitus   She requires a urine  test for diabetes monitoring. Perform urine test for diabetes monitoring.  Weight Management   She is working with a weight loss center, focusing on physical activity and dietary management. Her current weight is slightly below 210 lbs, with a goal to reduce to under 200 lbs. Encourage physical activity and dietary management to achieve weight loss goals.    Return for as sch in Dec.  Jenkins CHRISTELLA Carrel, MD

## 2024-01-22 ENCOUNTER — Other Ambulatory Visit: Payer: Self-pay

## 2024-01-22 ENCOUNTER — Emergency Department (HOSPITAL_BASED_OUTPATIENT_CLINIC_OR_DEPARTMENT_OTHER)

## 2024-01-22 ENCOUNTER — Emergency Department (HOSPITAL_BASED_OUTPATIENT_CLINIC_OR_DEPARTMENT_OTHER): Admission: EM | Admit: 2024-01-22 | Discharge: 2024-01-22 | Disposition: A | Discharge status: Home / Self Care

## 2024-01-22 ENCOUNTER — Encounter (HOSPITAL_BASED_OUTPATIENT_CLINIC_OR_DEPARTMENT_OTHER): Payer: Self-pay

## 2024-01-22 DIAGNOSIS — Z79899 Other long term (current) drug therapy: Secondary | ICD-10-CM | POA: Diagnosis not present

## 2024-01-22 DIAGNOSIS — W010XXA Fall on same level from slipping, tripping and stumbling without subsequent striking against object, initial encounter: Secondary | ICD-10-CM | POA: Diagnosis not present

## 2024-01-22 DIAGNOSIS — Z7982 Long term (current) use of aspirin: Secondary | ICD-10-CM | POA: Diagnosis not present

## 2024-01-22 DIAGNOSIS — M25561 Pain in right knee: Secondary | ICD-10-CM | POA: Diagnosis not present

## 2024-01-22 DIAGNOSIS — M25571 Pain in right ankle and joints of right foot: Secondary | ICD-10-CM | POA: Diagnosis not present

## 2024-01-22 DIAGNOSIS — Z043 Encounter for examination and observation following other accident: Secondary | ICD-10-CM | POA: Diagnosis not present

## 2024-01-22 DIAGNOSIS — M25562 Pain in left knee: Secondary | ICD-10-CM | POA: Diagnosis not present

## 2024-01-22 DIAGNOSIS — I10 Essential (primary) hypertension: Secondary | ICD-10-CM | POA: Insufficient documentation

## 2024-01-22 MED ORDER — ACETAMINOPHEN 500 MG PO TABS
1000.0000 mg | ORAL_TABLET | Freq: Once | ORAL | Status: AC
  Administered 2024-01-22 (×2): 1000 mg via ORAL
  Filled 2024-01-22: qty 2

## 2024-01-22 NOTE — ED Provider Notes (Signed)
 [place] EMERGENCY DEPARTMENT AT [facility] Provider Note   CSN: [id number] Arrival date & time: 01/22/24  1640     Patient presents with: Fall, Ankle Pain, and Knee Pain   Tina Sutton is a 73 y.o. female with PMHx anemia, OA, HLD, HTN, who presents to ED concerned for right ankle and left knee pain since a mechanical fall earlier this morning. Patient slipped on wet and uneven ground. Patient was attempting to deal with pain at home, but came to ED to make sure that nothing was broken. Patient has not taken any OTC medications for her pain. Patient stating that she is able to ambulate even though her ankle and knee hurts.   Patient denies blood thinner use, head trauma, headache, vision changes. Denies any other complaints today.    Fall  Ankle Pain Knee Pain      Prior to Admission medications   Medication Sig Start Date End Date Taking? Authorizing Provider  aspirin EC 81 MG tablet 1 tablet Orally Once a day    [provider]  Calcium  Carbonate-Vit D-Min (CALCIUM  1200 PO) Take by mouth daily.    [provider]  Cholecalciferol (VITAMIN D ) 50 MCG (2000 UT) CAPS Take by mouth.    [provider]  clobetasol  cream (TEMOVATE ) 0.05 % Apply 1 Application topically 2 (two) times daily. 11/23/23   Glennon Almarie POUR, MD  escitalopram  (LEXAPRO ) 10 MG tablet Take 1 tablet (10 mg total) by mouth daily. 01/07/24   Wendolyn Jenkins Jansky, MD  estradiol  (ESTRACE  VAGINAL) 0.1 MG/GM vaginal cream Rub pea size amount each night for 3 weeks then 3 times a week thereafter. Use daily when you are using clobetasol  11/23/23   Glennon Almarie POUR, MD  ferrous sulfate 324 MG TBEC Take 324 mg by mouth daily with breakfast.    [provider]  hydrochlorothiazide  (HYDRODIURIL ) 12.5 MG tablet Take 1 tablet (12.5 mg total) by mouth daily. 09/03/23   Wendolyn Jenkins Jansky, MD  losartan  (COZAAR ) 100 MG tablet Take 1 tablet (100 mg total) by mouth daily. 09/03/23    Wendolyn Jenkins Jansky, MD  Probiotic Product (PROBIOTIC-10 PO) Take by mouth.    [provider]  rosuvastatin  (CRESTOR ) 20 MG tablet Take 1 tablet (20 mg total) by mouth daily. 11/06/23   Wendolyn Jenkins Jansky, MD    Allergies: Patient has no known allergies.    Review of Systems  Musculoskeletal:        Ankle/knee pain    Updated Vital Signs BP 108/88   Pulse 78   Temp 98.7 F (37.1 C) (Oral)   Resp 18   LMP 06/12/2000   SpO2 100%   Physical Exam Vitals and nursing note reviewed.  Constitutional:      General: She is not in acute distress.    Appearance: She is not ill-appearing or toxic-appearing.  HENT:     Head: Normocephalic and atraumatic.     Mouth/Throat:     Mouth: Mucous membranes are moist.  Eyes:     General: No scleral icterus.       Right eye: No discharge.        Left eye: No discharge.     Conjunctiva/sclera: Conjunctivae normal.  Cardiovascular:     Rate and Rhythm: Normal rate and regular rhythm.     Pulses: Normal pulses.     Heart sounds: Normal heart sounds. No murmur heard. Pulmonary:     Effort: Pulmonary effort is normal. No respiratory distress.  Breath sounds: Normal breath sounds. No wheezing, rhonchi or rales.  Abdominal:     General: Abdomen is flat. Bowel sounds are normal. There is no distension.     Palpations: Abdomen is soft. There is no mass.     Tenderness: There is no abdominal tenderness.  Musculoskeletal:     Right lower leg: No edema.     Left lower leg: No edema.     Comments: Hip, knee, and ankle ROM intact. Area non-tense. +2 pedal pulses BL. No tenderness to palpation of hips. Bruising on anterior left knee and anterolateral right ankle. No erythema or increased warmth on BL LE. Sensation to light touch intact.  Skin:    General: Skin is warm and dry.     Findings: No rash.  Neurological:     General: No focal deficit present.     Mental Status: She is alert and oriented to person, place, and time. Mental status is  at baseline.  Psychiatric:        Mood and Affect: Mood normal.        Behavior: Behavior normal.     (all labs ordered are listed, but only abnormal results are displayed) Labs Reviewed - No data to display  EKG: None  Radiology: DG Ankle Complete Right Result Date: 01/22/2024 CLINICAL DATA:  Status post fall. EXAM: RIGHT ANKLE - COMPLETE 3+ VIEW COMPARISON:  None Available. FINDINGS: There is no evidence of fracture, dislocation, or joint effusion. A moderate sized plantar calcaneal spur is seen. There is no evidence of arthropathy or other focal bone abnormality. There is moderate severity diffuse soft tissue swelling which may be, in part, related to the patient's body habitus. IMPRESSION: 1. No acute fracture or dislocation. 2. Moderate severity diffuse soft tissue swelling which may be related to the patient's body habitus. Electronically Signed   By: Suzen Dials M.D.   On: 01/22/2024 17:24   DG Knee 2 Views Left Result Date: 01/22/2024 CLINICAL DATA:  Fall EXAM: LEFT KNEE - 1-2 VIEW COMPARISON:  None Available. FINDINGS: There is diffuse soft tissue swelling of the anterior knee. There is no significant joint effusion. There is no acute fracture or dislocation. Joint spaces are maintained. IMPRESSION: Diffuse soft tissue swelling of the anterior knee. No acute fracture or dislocation. Electronically Signed   By: Greig Pique M.D.   On: 01/22/2024 17:20     Procedures   Medications Ordered in the ED  acetaminophen  (TYLENOL ) tablet 1,000 mg (1,000 mg Oral Given 01/22/24 1807)                                    Medical Decision Making Amount and/or Complexity of Data Reviewed Radiology: ordered.  Risk OTC drugs.   This patient presents to the ED after a fall, this involves an extensive number of treatment options, and is a complaint that carries with it a high risk of complications and morbidity.  The differential diagnosis includes  intracranial hemorrhage,  subdural/epidural hematoma, vertebral fracture, spinal cord injury, muscle strain, skull fracture, fracture.   Co morbidities that complicate the patient evaluation  anemia, OA, HLD, HTN   Additional history obtained:  Dr. Wendolyn PCP   Problem List / ED Course / Critical interventions / Medication management  Patient presented for Fall. Patient with stable vitals and does not appear to be in distress.  Physical exam with bruising of left knee and right ankle.  Rest of physical exam reassuring.  Patient afebrile with stable vitals. I ordered imaging studies including left knee/right ankle x-ray. I independently visualized and interpreted imaging which showed no fractures or dislocations. I agree with the radiologist interpretation. Shared results with patient.  Answered all questions.  Patient with ice, Tylenol , ankle brace, and knee sleeve.  Educated patient that she need to follow-up with PCP and orthopedics if pain is not resolving appropriately in the next 7 days.  Patient verbalized understanding of plan and states that she is ready go home. Staffed with Dr. Simon. I have reviewed the patients home medicines and have made adjustments as needed The patient has been appropriately medically screened and/or stabilized in the ED. I have low suspicion for any other emergent medical condition which would require further screening, evaluation or treatment in the ED or require inpatient management. At time of discharge the patient is hemodynamically stable and in no acute distress. I have discussed work-up results and diagnosis with patient and answered all questions. Patient is agreeable with discharge plan. We discussed strict return precautions for returning to the emergency department and they verbalized understanding.     Social Determinants of Health:  geriatric        Final diagnoses:  Acute right ankle pain  Acute pain of left knee    ED Discharge Orders     None           Hoy Nidia FALCON, [place] 01/22/24 1812    Simon Lavonia SAILOR, MD 01/22/24 2314

## 2024-01-22 NOTE — ED Triage Notes (Signed)
 Patient fell this morning at 11 am. She thought the could deal with it but is having increasing pain. She has right ankle swelling and ecchymosis. She also has left knee pain and swelling. -LOC, -Anticoagulation, and - head strike.

## 2024-01-22 NOTE — Discharge Instructions (Addendum)
 Please follow-up with your primary care provider.  You can also follow-up with orthopedics.  Seek emergency care if experiencing any new or worsening symptoms.  Alternating between 650 mg Tylenol  and 400 mg Advil: The best way to alternate taking Acetaminophen  (example Tylenol ) and Ibuprofen (example Advil/Motrin) is to take them 3 hours apart. For example, if you take ibuprofen at 6 am you can then take Tylenol  at 9 am. You can continue this regimen throughout the day, making sure you do not exceed the recommended maximum dose for each drug.

## 2024-01-23 DIAGNOSIS — S8982XA Other specified injuries of left lower leg, initial encounter: Secondary | ICD-10-CM | POA: Diagnosis not present

## 2024-01-23 DIAGNOSIS — S80912A Unspecified superficial injury of left knee, initial encounter: Secondary | ICD-10-CM | POA: Diagnosis not present

## 2024-01-28 NOTE — Telephone Encounter (Signed)
 Call to patient. Patient advised of 20% coinsurance of $490 monthly for the Evenity  injections. Patient states she is not going to take the injections at this time. States she takes calcium  and vitamin d  daily. RN advised would review with provider and return call with additional recommendations.   Routing to provider to review and advise.

## 2024-01-30 DIAGNOSIS — M25571 Pain in right ankle and joints of right foot: Secondary | ICD-10-CM | POA: Diagnosis not present

## 2024-01-30 DIAGNOSIS — M25562 Pain in left knee: Secondary | ICD-10-CM | POA: Diagnosis not present

## 2024-02-28 DIAGNOSIS — M25562 Pain in left knee: Secondary | ICD-10-CM | POA: Diagnosis not present

## 2024-02-28 DIAGNOSIS — M79605 Pain in left leg: Secondary | ICD-10-CM | POA: Diagnosis not present

## 2024-02-28 DIAGNOSIS — M25571 Pain in right ankle and joints of right foot: Secondary | ICD-10-CM | POA: Diagnosis not present

## 2024-02-29 ENCOUNTER — Ambulatory Visit (HOSPITAL_COMMUNITY)

## 2024-03-03 ENCOUNTER — Ambulatory Visit (HOSPITAL_COMMUNITY)
Admission: RE | Admit: 2024-03-03 | Discharge: 2024-03-03 | Disposition: A | Discharge status: Home / Self Care | Source: Ambulatory Visit | Attending: Surgery | Admitting: Surgery

## 2024-03-03 ENCOUNTER — Other Ambulatory Visit (HOSPITAL_COMMUNITY): Payer: Self-pay | Admitting: Physician Assistant

## 2024-03-03 DIAGNOSIS — M79605 Pain in left leg: Secondary | ICD-10-CM | POA: Diagnosis not present

## 2024-03-28 ENCOUNTER — Telehealth: Payer: Self-pay

## 2024-03-28 ENCOUNTER — Other Ambulatory Visit: Payer: Self-pay

## 2024-03-28 DIAGNOSIS — F4321 Adjustment disorder with depressed mood: Secondary | ICD-10-CM

## 2024-03-28 MED ORDER — ESCITALOPRAM OXALATE 10 MG PO TABS: 15.0000 mg | ORAL_TABLET | Freq: Every day | ORAL | 1 refills | Status: DC

## 2024-03-28 NOTE — Telephone Encounter (Signed)
 Copied from CRM [id number]. Topic: Clinical - Medication Question >> Mar 28, 2024  8:29 AM [place] A wrote: Reason for CRM: Patient said that Dr.Kulick told her she could increase dosage of Lexapro  if needed. Pt did increase and needs new prescription because she will be running out sooner.

## 2024-03-28 NOTE — Progress Notes (Signed)
 Spoke to patient to verify dosage of Lexapro . Pt stated increased to 15 and need another refill. Per Dr Wendolyn sent in refill indicating pt taking 15mg  of Lexapro .

## 2024-04-08 DIAGNOSIS — M25562 Pain in left knee: Secondary | ICD-10-CM | POA: Diagnosis not present

## 2024-04-22 DIAGNOSIS — M25562 Pain in left knee: Secondary | ICD-10-CM | POA: Diagnosis not present

## 2024-05-06 DIAGNOSIS — M222X2 Patellofemoral disorders, left knee: Secondary | ICD-10-CM | POA: Diagnosis not present

## 2024-05-06 DIAGNOSIS — M25562 Pain in left knee: Secondary | ICD-10-CM | POA: Diagnosis not present

## 2024-05-12 ENCOUNTER — Encounter (INDEPENDENT_AMBULATORY_CARE_PROVIDER_SITE_OTHER): Payer: Self-pay | Admitting: Family Medicine

## 2024-05-12 ENCOUNTER — Ambulatory Visit (INDEPENDENT_AMBULATORY_CARE_PROVIDER_SITE_OTHER): Payer: Self-pay | Admitting: Family Medicine

## 2024-05-12 VITALS — BP 149/85 | HR 64 | Temp 98.3°F | Ht 63.0 in | Wt 209.0 lb

## 2024-05-12 DIAGNOSIS — E669 Obesity, unspecified: Secondary | ICD-10-CM

## 2024-05-12 DIAGNOSIS — F4321 Adjustment disorder with depressed mood: Secondary | ICD-10-CM | POA: Diagnosis not present

## 2024-05-12 DIAGNOSIS — I10 Essential (primary) hypertension: Secondary | ICD-10-CM

## 2024-05-12 DIAGNOSIS — Z6837 Body mass index (BMI) 37.0-37.9, adult: Secondary | ICD-10-CM

## 2024-05-12 NOTE — Progress Notes (Signed)
 Office: [phone]  /  Fax: [phone]  WEIGHT SUMMARY AND BIOMETRICS  Anthropometric Measurements Height: 5' 3 (1.6 m) Weight: 209 lb (94.8 kg) BMI (Calculated): 37.03 Weight at Last Visit: 208 lb Weight Lost Since Last Visit: 0 Weight Gained Since Last Visit: 1 lb Starting Weight: 226 lb Total Weight Loss (lbs): 17 lb (7.711 kg) Peak Weight: 242 lb   Body Composition  Body Fat %: 45.3 % Fat Mass (lbs): 94.8 lbs Muscle Mass (lbs): 108.6 lbs Total Body Water (lbs): 78.2 lbs Visceral Fat Rating : 15   Other Clinical Data Fasting: yes Labs: no Today's Visit #: 30 Starting Date: 12/09/19    Chief Complaint: OBESITY     History of Present Illness Tina Sutton is a 73 year old female who presents for a follow-up on her obesity treatment plan and progress assessment.  She has been prescribed a category two eating plan but follows it about ten percent of the time. She is working on increasing her intake of fruits and vegetables but is not meeting her recommended protein intake. She skips meals and does not consistently get seven to nine hours of sleep per night. She engages in physical activity by walking for twenty minutes three days a week and is making efforts to stay hydrated. Despite these efforts, she has gained one pound over the last five months.  Her blood pressure readings today were elevated at 156/77 and 149/85. She is currently taking hydrochlorothiazide  12.5 mg daily and losartan  100 mg daily for hypertension. Her blood pressure readings at home are generally within normal limits, with occasional higher readings when she forgets to take her medication.  She is experiencing significant emotional distress due to multiple personal losses, including the death of her dog, a friend, and a woman she was caring for, as well as her sister being in hospice care. She has started taking escitalopram  (Lexapro ) for depression, currently at a dose of 15 mg daily.  In  August, she fell and injured her left knee, which remains swollen from the knee to the ankle. An MRI showed some swelling in the kneecap but no fractures or ligament tears. She experiences pain when climbing stairs and getting up from a chair. She is scheduled to start physical therapy next month. Her exercise is limited due to this.  Her sister, who is 60 years old, was diagnosed with colon cancer that has metastasized to her liver and lungs. Her sister's condition has deteriorated rapidly since the diagnosis in October, and she is now nonverbal and in hospice care. She has been providing support to her sister and her nieces, who have been caring for her sister around the clock.      PHYSICAL EXAM:  Blood pressure (!) 149/85, pulse 64, temperature 98.3 F (36.8 C), height 5' 3 (1.6 m), weight 209 lb (94.8 kg), last menstrual period 06/12/2000, SpO2 94%. Body mass index is 37.02 kg/m.  DIAGNOSTIC DATA REVIEWED:  BMET    Component Value Date/Time   NA 139 11/06/2023 1349   NA 140 01/03/2023 1131   K 4.0 11/06/2023 1349   CL 103 11/06/2023 1349   CO2 25 11/06/2023 1349   GLUCOSE 133 (H) 11/06/2023 1349   BUN 21 11/06/2023 1349   BUN 15 01/03/2023 1131   CREATININE 0.68 11/06/2023 1349   CALCIUM  9.8 11/06/2023 1349   GFRNONAA 86 06/22/2020 0825   GFRAA 100 06/22/2020 0825   Lab Results  Component Value Date   HGBA1C 5.6 11/06/2023  HGBA1C 6.5 10/31/2019   Lab Results  Component Value Date   INSULIN  10.7 01/03/2023   INSULIN  24.9 12/09/2019   Lab Results  Component Value Date   TSH 2.19 10/11/2022   CBC    Component Value Date/Time   WBC 10.9 (H) 11/06/2023 1349   RBC 4.57 11/06/2023 1349   HGB 13.8 11/06/2023 1349   HCT 41.4 11/06/2023 1349   PLT 191.0 11/06/2023 1349   MCV 90.6 11/06/2023 1349   MCH 30.3 07/30/2020 1107   MCHC 33.4 11/06/2023 1349   RDW 13.4 11/06/2023 1349   Iron Studies    Component Value Date/Time   IRON 131 04/13/2023 1136   TIBC  413.0 04/13/2023 1136   FERRITIN 12.8 04/13/2023 1136   IRONPCTSAT 31.7 04/13/2023 1136   IRONPCTSAT 24 11/27/2022 1414   Lipid Panel     Component Value Date/Time   CHOL 169 11/06/2023 1349   CHOL 146 01/03/2023 1131   TRIG 76.0 11/06/2023 1349   HDL 79.10 11/06/2023 1349   HDL 77 01/03/2023 1131   CHOLHDL 2 11/06/2023 1349   VLDL 15.2 11/06/2023 1349   LDLCALC 74 11/06/2023 1349   LDLCALC 55 01/03/2023 1131   Hepatic Function Panel     Component Value Date/Time   PROT 7.2 11/06/2023 1349   PROT 6.8 01/03/2023 1131   ALBUMIN 4.6 11/06/2023 1349   ALBUMIN 4.5 01/03/2023 1131   AST 21 11/06/2023 1349   ALT 26 11/06/2023 1349   ALKPHOS 51 11/06/2023 1349   BILITOT 0.4 11/06/2023 1349   BILITOT 0.5 01/03/2023 1131   BILIDIR 0.1 04/12/2022 0849      Component Value Date/Time   TSH 2.19 10/11/2022 0820   Nutritional Lab Results  Component Value Date   VD25OH 53 11/23/2023   VD25OH 59.0 01/03/2023   VD25OH 49.9 07/05/2022     Assessment and Plan Assessment & Plan Obesity  Obesity management is ongoing with a focus on dietary changes and physical activity. She has been prescribed a category two eating plan but has only adhered to it about 10% of the time. She is working on increasing fruit and vegetable intake but recognizes a lack of protein. She is skipping meals and not consistently achieving 7-9 hours of sleep per night. She is walking 20 minutes three days a week and attempting to hydrate. Despite these efforts, she has gained one pound over the last five months. Comfort eating is noted due to significant life stressors, including the death of her dog, a friend, and her sister's declining health. She is aware of the need to maintain progress and is mindful of her dietary choices, such as skipping desserts and bread. - Continue current dietary and physical activity efforts.  - She will start PT for her knee which will count as exercise for now. - Encouraged adherence  to the category two eating plan as much as possible. - Will reassess dietary and physical activity plan in future visits.  Essential hypertension Blood pressure is elevated today at 156/77 and 149/85. She is currently on hydrochlorothiazide  12.5 mg daily and losartan  100 mg daily. Home blood pressure readings have been variable, with some elevated readings when medication is missed. Stress and situational factors may be contributing to elevated readings in the clinic. - Continue current antihypertensive medications: hydrochlorothiazide  12.5 mg daily and losartan  100 mg daily. - Monitor blood pressure at home weekly for the next month to assess for consistent elevation. - Will reassess blood pressure management in future visits.  Situational depression/adjustment disorder She is experiencing situational depression due to multiple life stressors, including the death of her dog, a friend, and her sister's declining health. She has started escitalopram  (Lexapro ) and increased the dosage to 15 mg. She reports no significant difference in mood but acknowledges the need for emotional support. Lexapro  is noted to be beneficial for weight management in the context of depression. - Continue escitalopram  (Lexapro ) at 15 mg daily. - Discuss with primary care physician about potential dosage adjustment to 20 mg. - Encouraged emotional support and self-care during this challenging time.      Patients who are on anti-obesity medications are counseled on the importance of maintaining healthy lifestyle habits, including balanced nutrition, regular physical activity, and behavioral modifications,  Medication is an adjunct to, not a replacement for, lifestyle changes and that the long-term success and weight maintenance depend on continued adherence to these strategies.   Rossie was informed of the importance of frequent follow up visits to maximize her success with intensive lifestyle modifications for her  obesity and obesity related health conditions as recommended by USPSTF and CMS guidelines  I personally spent a total of 36 minutes in the care of the patient today including preparing to see the patient, performing a medically appropriate evaluation of current problems, documenting clinical information in the EMR, customized nutritional counseling for their specific health and social needs, and discussing emotional eating behaviors and how to make strategies to change these behaviors.  Louann Penton, MD

## 2024-05-26 ENCOUNTER — Ambulatory Visit: Payer: Self-pay | Admitting: Family Medicine

## 2024-05-26 ENCOUNTER — Ambulatory Visit (INDEPENDENT_AMBULATORY_CARE_PROVIDER_SITE_OTHER): Admitting: Family Medicine

## 2024-05-26 ENCOUNTER — Encounter: Payer: Self-pay | Admitting: Family Medicine

## 2024-05-26 VITALS — BP 128/76 | HR 64 | Temp 97.0°F | Ht 63.0 in | Wt 212.5 lb

## 2024-05-26 DIAGNOSIS — E611 Iron deficiency: Secondary | ICD-10-CM

## 2024-05-26 DIAGNOSIS — E119 Type 2 diabetes mellitus without complications: Secondary | ICD-10-CM

## 2024-05-26 DIAGNOSIS — E559 Vitamin D deficiency, unspecified: Secondary | ICD-10-CM | POA: Diagnosis not present

## 2024-05-26 DIAGNOSIS — Z85828 Personal history of other malignant neoplasm of skin: Secondary | ICD-10-CM | POA: Diagnosis not present

## 2024-05-26 DIAGNOSIS — Z0000 Encounter for general adult medical examination without abnormal findings: Secondary | ICD-10-CM | POA: Diagnosis not present

## 2024-05-26 DIAGNOSIS — E7800 Pure hypercholesterolemia, unspecified: Secondary | ICD-10-CM | POA: Diagnosis not present

## 2024-05-26 DIAGNOSIS — F4321 Adjustment disorder with depressed mood: Secondary | ICD-10-CM

## 2024-05-26 DIAGNOSIS — I10 Essential (primary) hypertension: Secondary | ICD-10-CM

## 2024-05-26 DIAGNOSIS — E1169 Type 2 diabetes mellitus with other specified complication: Secondary | ICD-10-CM

## 2024-05-26 DIAGNOSIS — Z6837 Body mass index (BMI) 37.0-37.9, adult: Secondary | ICD-10-CM | POA: Diagnosis not present

## 2024-05-26 LAB — COMPREHENSIVE METABOLIC PANEL WITH GFR
ALT: 21 U/L (ref 0–35)
AST: 19 U/L (ref 0–37)
Albumin: 4.3 g/dL (ref 3.5–5.2)
Alkaline Phosphatase: 57 U/L (ref 39–117)
BUN: 14 mg/dL (ref 6–23)
CO2: 31 meq/L (ref 19–32)
Calcium: 9.4 mg/dL (ref 8.4–10.5)
Chloride: 105 meq/L (ref 96–112)
Creatinine, Ser: 0.7 mg/dL (ref 0.40–1.20)
GFR: 86.22 mL/min (ref >60.00)
Glucose, Bld: 91 mg/dL (ref 70–99)
Potassium: 4.2 meq/L (ref 3.5–5.1)
Sodium: 142 meq/L (ref 135–145)
Total Bilirubin: 0.5 mg/dL (ref 0.2–1.2)
Total Protein: 6.3 g/dL (ref 6.0–8.3)

## 2024-05-26 LAB — LIPID PANEL
Cholesterol: 146 mg/dL (ref 0–200)
HDL: 75.9 mg/dL (ref >39.00)
LDL Cholesterol: 59 mg/dL (ref 0–99)
NonHDL: 70.2
Total CHOL/HDL Ratio: 2
Triglycerides: 56 mg/dL (ref 0.0–149.0)
VLDL: 11.2 mg/dL (ref 0.0–40.0)

## 2024-05-26 LAB — CBC WITH DIFFERENTIAL/PLATELET
Basophils Absolute: 0 K/uL (ref 0.0–0.1)
Basophils Relative: 0.6 % (ref 0.0–3.0)
Eosinophils Absolute: 0.1 K/uL (ref 0.0–0.7)
Eosinophils Relative: 0.9 % (ref 0.0–5.0)
HCT: 40.9 % (ref 36.0–46.0)
Hemoglobin: 13.9 g/dL (ref 12.0–15.0)
Lymphocytes Relative: 23 % (ref 12.0–46.0)
Lymphs Abs: 1.4 K/uL (ref 0.7–4.0)
MCHC: 34 g/dL (ref 30.0–36.0)
MCV: 89.4 fl (ref 78.0–100.0)
Monocytes Absolute: 0.6 K/uL (ref 0.1–1.0)
Monocytes Relative: 8.9 % (ref 3.0–12.0)
Neutro Abs: 4.2 K/uL (ref 1.4–7.7)
Neutrophils Relative %: 66.6 % (ref 43.0–77.0)
Platelets: 155 K/uL (ref 150.0–400.0)
RBC: 4.57 Mil/uL (ref 3.87–5.11)
RDW: 13.4 % (ref 11.5–15.5)
WBC: 6.2 K/uL (ref 4.0–10.5)

## 2024-05-26 LAB — IBC + FERRITIN
Ferritin: 27 ng/mL (ref 10.0–291.0)
Iron: 105 ug/dL (ref 42–145)
Saturation Ratios: 28.3 % (ref 20.0–50.0)
TIBC: 371 ug/dL (ref 250.0–450.0)
Transferrin: 265 mg/dL (ref 212.0–360.0)

## 2024-05-26 LAB — MAGNESIUM: Magnesium: 2.2 mg/dL (ref 1.5–2.5)

## 2024-05-26 LAB — HEMOGLOBIN A1C: Hgb A1c MFr Bld: 5.7 % (ref 4.6–6.5)

## 2024-05-26 LAB — VITAMIN B12: Vitamin B-12: 278 pg/mL (ref 211–911)

## 2024-05-26 MED ORDER — ESCITALOPRAM OXALATE 20 MG PO TABS: 20.0000 mg | ORAL_TABLET | Freq: Every day | ORAL | 1 refills | Status: AC

## 2024-05-26 NOTE — Progress Notes (Signed)
 Labs great except B12 on low end of normal  make sure taking B12 vitamin

## 2024-05-26 NOTE — Patient Instructions (Addendum)
 It was very nice to see you today!  Happy Holidays  Lamisil cream and possibly vicks vabor rub.  Can see podiatrist   PLEASE NOTE:  If you had any lab tests please let us  know if you have not heard back within a few days. You may see your results on MyChart before we have a chance to review them but we will give you a call once they are reviewed by us . If we ordered any referrals today, please let us  know if you have not heard from their office within the next week.   Please try these tips to maintain a healthy lifestyle:  Eat most of your calories during the day when you are active. Eliminate processed foods including packaged sweets (pies, cakes, cookies), reduce intake of potatoes, white bread, white pasta, and white rice. Look for whole grain options, oat flour or almond flour.  Each meal should contain half fruits/vegetables, one quarter protein, and one quarter carbs (no bigger than a computer mouse).  Cut down on sweet beverages. This includes juice, soda, and sweet tea. Also watch fruit intake, though this is a healthier sweet option, it still contains natural sugar! Limit to 3 servings daily.  Drink at least 1 glass of water with each meal and aim for at least 8 glasses per day  Exercise at least 150 minutes every week.

## 2024-05-26 NOTE — Progress Notes (Signed)
 Phone [phone]   Subjective:   Patient is a 73 y.o. female presenting for annual physical.    Chief Complaint  Patient presents with   Annual Exam    Pt is here for CPE    Discussed the use of AI scribe software for clinical note transcription with the patient, who gave verbal consent to proceed.  History of Present Illness Tina Sutton is a 73 year old female who presents for an annual physical exam and follow-up on her chronic conditions.  She manages hypertension with losartan  100 mg and HCTZ 12.5 mg, maintaining blood pressures in the 120s/70s range. She experiences shortness of breath when climbing stairs. Her last cardiology visit was in June 2025 for unusual indigestion, which has since resolved. No chest pain or heart racing is noted.  For hyperlipidemia, she is on rosuvastatin  20 mg and reports doing well. Her cholesterol levels were checked six months ago and were satisfactory.  Regarding osteopenia, she has not taken Evenity  due to cost but continues with calcium  and vitamin D  supplements. She walks daily for exercise. She had a fall in 03-Sep-2025but did not sustain any fractures. She reports swelling from her knee to her ankle on the left side and is starting physical therapy. Saw ortho  She increased her Lexapro  dosage to 20 mg on 2024-05-27, following the death of her sister from cancer on May 26, 2024, and a friend's death in 02/13/24. No thoughts of suicide or self-harm.  She manages her diabetes with dietary modifications and reports no gastrointestinal symptoms or urinary issues.  Her family history now includes a sister diagnosed with colon cancer at age 3, in addition to two sisters with breast cancer.  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  She suspects a fungal infection under one toenail, which she initially thought was an injury. The nail is raised with debris  underneath.    See problem oriented charting- ROS- ROS: Gen: no fever, chills  Skin: no rash, itching ENT: no ear pain, ear drainage, nasal congestion, rhinorrhea, sinus pressure, sore throat Eyes: no blurry vision, double vision Resp: no cough, wheeze,SOB CV: no CP, palpitations, + chronic mild LE edema,  GI: no heartburn, n/v/d/c, abd pain GU: no dysuria, urgency, frequency, hematuria MSK: HPI Neuro: no dizziness, headache, weakness, vertigo Psych: HPI   The following were reviewed and entered/updated in epic: Past Medical History:  Diagnosis Date   Anemia    Arthritis    Cancer (HCC)    squamous cell cheek   Cataract    Surgery 2022   Depression 10/10/2005   Resolved   Dysmenorrhea    Fibroid    Heart murmur 11/05/20   Hyperlipemia    Hyperlipidemia    Hypertension 1980   Inverted nipple x several yrs   bilateral, pt states nipples have always been inverted   Joint pain    Obesity    Sleep apnea 2014   Resolved   SOB (shortness of breath)    Vitamin D  deficiency    Patient Active Problem List   Diagnosis Date Noted   Morbid (severe) obesity due to excess calories (HCC) 05/26/2024   Adjustment disorder with depressed mood 12/03/2023   Stress 06/07/2023   Low serum iron 04/15/2023   Prediabetes 10/11/2022   Class 1 obesity 07/05/2022   Obesity, Beginning BMI 38.79 07/05/2022   Diabetes mellitus (HCC) 04/04/2022   Hypertensive retinopathy, bilateral  08/31/2021   Chronic right hip pain 11/15/2020   Allergic rhinitis 08/30/2020   Chronic kidney disease due to hypertension 08/30/2020   History of skin cancer 08/30/2020   Hyperlipidemia 08/30/2020   Osteopenia 08/30/2020   History of colon polyps 08/30/2020   Vitamin D  deficiency 08/30/2020   Essential hypertension 03/03/2020   Past Surgical History:  Procedure Laterality Date   BLEPHAROPLASTY Bilateral 02/21/2008   BREAST BIOPSY Right    BREAST EXCISIONAL BIOPSY Right    benign   COLONOSCOPY      EYE SURGERY  02/21/08   Blepharoplasty   PELVIC LAPAROSCOPY     TOTAL ABDOMINAL HYSTERECTOMY  05/04/2001   [place] [facility].  fibroids    Family History  Problem Relation Age of Onset   Hypertension Mother    Alzheimer's disease Mother    Heart disease Mother    Hyperlipidemia Mother    Depression Mother    Obesity Mother    Hypertension Father    Diabetes Father    Stroke Father    Obesity Father    Colon polyps Father    Vision loss Father    Breast cancer Sister    Hypertension Sister    Cancer Sister    Breast cancer Sister    Hypertension Sister    Cancer Sister    COPD Sister    Cancer Sister 56 - 48       colon   Hypertension Sister    Asthma Sister    Early death Sister    Miscarriages / Stillbirths Sister    Early death Sister    Miscarriages / Stillbirths Sister    Esophageal cancer Neg Hx    Liver disease Neg Hx    Colon cancer Neg Hx    Stomach cancer Neg Hx    Rectal cancer Neg Hx     Medications- reviewed and updated Current Outpatient Medications  Medication Sig Dispense Refill   aspirin EC 81 MG tablet 1 tablet Orally Once a day     Calcium  Carbonate-Vit D-Min (CALCIUM  1200 PO) Take by mouth daily.     Cholecalciferol (VITAMIN D ) 50 MCG (2000 UT) CAPS Take by mouth.     clobetasol  cream (TEMOVATE ) 0.05 % Apply 1 Application topically 2 (two) times daily. 30 g 0   estradiol  (ESTRACE  VAGINAL) 0.1 MG/GM vaginal cream Rub pea size amount each night for 3 weeks then 3 times a week thereafter. Use daily when you are using clobetasol  42.5 g 12   ferrous sulfate 324 MG TBEC Take 324 mg by mouth daily with breakfast.     hydrochlorothiazide  (HYDRODIURIL ) 12.5 MG tablet Take 1 tablet (12.5 mg total) by mouth daily. 90 tablet 3   losartan  (COZAAR ) 100 MG tablet Take 1 tablet (100 mg total) by mouth daily. 90 tablet 3   Probiotic Product (PROBIOTIC-10 PO) Take by mouth.     rosuvastatin  (CRESTOR ) 20 MG tablet Take 1 tablet (20 mg total) by mouth daily. 90 tablet 2    escitalopram  (LEXAPRO ) 20 MG tablet Take 1 tablet (20 mg total) by mouth daily. 90 tablet 1   No current facility-administered medications for this visit.    Allergies-reviewed and updated Allergies[1]  Social History   Social History Narrative      Retired animator companies   Objective  Objective:  BP 128/76 (BP Location: Left Arm, Patient Position: Sitting)   Pulse 64   Temp (!) 97 F (36.1 C) (Temporal)   Ht 5' 3 (1.6  m)   Wt 212 lb 8 oz (96.4 kg)   LMP 06/12/2000   SpO2 96%   BMI 37.64 kg/m  Physical Exam  Gen: WDWN NAD HEENT: NCAT, conjunctiva not injected, sclera nonicteric TM WNL B, OP moist, no exudates  NECK:  supple, no thyromegaly, no nodes, no carotid bruits CARDIAC: RRR, S1S2+, +2/6 sys murmur. DP 2+B LUNGS: CTAB. No wheezes ABDOMEN:  BS+, soft, NTND, No HSM, no masses EXT:  tr edema MSK: no gross abnormalities. MS 5/5 all 4 NEURO: A&O x3.  CN II-XII intact.  PSYCH: normal mood. Good eye contact   L great toenail lifting and thick on distal 1/3    Assessment and Plan   Health Maintenance counseling: 1. Anticipatory guidance: Patient counseled regarding regular dental exams q6 months, eye exams,  avoiding smoking and second hand smoke, limiting alcohol to 1 beverage per day, no illicit drugs.   2. Risk factor reduction:  Advised patient of need for regular exercise and diet rich and fruits and vegetables to reduce risk of heart attack and stroke. Exercise- +.  Wt Readings from Last 3 Encounters:  05/26/24 212 lb 8 oz (96.4 kg)  05/12/24 209 lb (94.8 kg)  01/07/24 210 lb (95.3 kg)   3. Immunizations/screenings/ancillary studies Immunization History  Administered Date(s) Administered   Fluad Quad(high Dose 65+) 03/10/2022, 04/09/2024   Fluzone Influenza virus vaccine,trivalent (IIV3), split virus 03/27/2016, 07/13/2017, 04/25/2018, 03/06/2019, 03/08/2020   INFLUENZA, HIGH DOSE SEASONAL PF 03/10/2022   Influenza-Unspecified  03/27/2016, 04/25/2018, 03/08/2020, 03/06/2023   Moderna Covid-19 Fall Seasonal Vaccine 44yrs & older 03/06/2023   PFIZER(Purple Top)SARS-COV-2 Vaccination 07/25/2019, 08/19/2019, 03/08/2020, 11/22/2020   Pfizer Covid-19 Vaccine Bivalent Booster 37yrs & up 04/03/2021, 03/22/2022   Pneumococcal Conjugate-13 11/07/2016   Pneumococcal Polysaccharide-23 11/19/2017   Pneumococcal-Unspecified 11/19/2017   Respiratory Syncytial Virus Vaccine,Recomb Aduvanted(Arexvy) 04/22/2022   Tdap 06/25/2018   Zoster Recombinant(Shingrix) 01/03/2017, 06/08/2018   Zoster, Live 01/03/2017, 06/08/2018   Health Maintenance Due  Topic Date Due   FOOT EXAM  04/12/2024    4. Cervical cancer screening- utd 5. Breast cancer screening-  mammogram due 2/27 6. Colon cancer screening - due 01/03/33 7. Skin cancer screening- advised regular sunscreen use. Denies worrisome, changing, or new skin lesions.  8. Birth control/STD check- n/a 9. Osteoporosis screening- utd 10. Smoking associated screening - non smoker  Wellness examination  Essential hypertension -     CBC with Differential/Platelet -     Comprehensive metabolic panel with GFR -     Magnesium  Type 2 diabetes mellitus with other specified complication, without long-term current use of insulin  (HCC) -     Comprehensive metabolic panel with GFR -     Hemoglobin A1c  Pure hypercholesterolemia -     Comprehensive metabolic panel with GFR -     Lipid panel  Vitamin D  deficiency  Low serum iron -     CBC with Differential/Platelet -     IBC + Ferritin -     Vitamin B12  Adjustment disorder with depressed mood -     Escitalopram  Oxalate; Take 1 tablet (20 mg total) by mouth daily.  Dispense: 90 tablet; Refill: 1  History of skin cancer  Morbid (severe) obesity due to excess calories (HCC)    Assessment and Plan Assessment & Plan Essential hypertension   Blood pressure is well-controlled with losartan  and HCTZ, with readings around 120s/70s.  She experiences exertional dyspnea when climbing stairs but no chest pain, palpitations, or dyspnea otherwise. Continue losartan   and HCTZ as prescribed.  Type 2 diabetes mellitus   Diabetes management is ongoing with dietary modifications. She reports no symptoms of nausea, diarrhea, constipation, or urinary issues. Continue dietary modifications.  Pure hypercholesterolemia   Cholesterol levels were well-controlled six months ago. She is currently on rosuvastatin  20 mg with no issues. Continue rosuvastatin  20 mg and recheck cholesterol levels today.  Osteopenia   Management includes calcium  and vitamin D  supplementation. Evenity  was not started due to cost, and she engages in daily walking. Prolia was mentioned as an alternative but not pursued. Continue calcium  and vitamin D  supplementation and daily walking. Consider contacting the provider to discuss alternative osteoporosis treatments.  Adjustment disorder with depressed mood   Lexapro  was increased to 20 mg due to recent bereavements, with concerns about potential numbness from the increased dose. No suicidal ideation or self-harm thoughts are reported. Continue Lexapro  20 mg until at least the end of the year and monitor emotional response and coping mechanisms.  Onychomycosis   Suspected fungal infection under the toenail, possibly chronic damage, with previous toenail removal due to fungus. Options include over-the-counter Lamisil cream or Penlac nail polish. Consider using over-the-counter Lamisil cream or Penlac nail polish and referral to a podiatrist if needed.  History of skin cancer   She has regular dermatology follow-ups for a previous diagnosis of squamous cell carcinoma, not melanoma. Continue regular dermatology follow-ups.  General Health Maintenance   Family history updated with sister's colon cancer diagnosis. Previous colonoscopy in 2024 showed no polyps. Considering a return to a five-year colonoscopy interval. Consult  with a gastroenterologist regarding colonoscopy interval and continue regular dermatology follow-ups. Annual wellness-antic guidance  Obesity-working on diet/exercise and going to wellness clinic     Recommended follow up: Return in about 6 months (around 11/24/2024) for chronic follow-up.  Lab/Order associations:+ fasting  Jenkins CHRISTELLA Carrel, MD       [1] No Known Allergies

## 2024-05-27 NOTE — Progress Notes (Signed)
 Pt has read results [facility]

## 2024-06-25 ENCOUNTER — Other Ambulatory Visit: Payer: Self-pay | Admitting: Family Medicine

## 2024-06-25 DIAGNOSIS — Z1231 Encounter for screening mammogram for malignant neoplasm of breast: Secondary | ICD-10-CM

## 2024-07-03 LAB — OPHTHALMOLOGY REPORT-SCANNED

## 2024-08-11 ENCOUNTER — Ambulatory Visit

## 2024-10-23 ENCOUNTER — Ambulatory Visit (HOSPITAL_BASED_OUTPATIENT_CLINIC_OR_DEPARTMENT_OTHER): Admitting: Cardiology

## 2024-11-04 ENCOUNTER — Ambulatory Visit (INDEPENDENT_AMBULATORY_CARE_PROVIDER_SITE_OTHER): Admitting: Family Medicine

## 2024-11-25 ENCOUNTER — Encounter: Admitting: Obstetrics and Gynecology

## 2024-11-27 ENCOUNTER — Ambulatory Visit: Admitting: Family Medicine

## 2024-12-08 ENCOUNTER — Ambulatory Visit: Admitting: Family Medicine

## 2024-12-15 ENCOUNTER — Encounter: Admitting: Obstetrics and Gynecology
